# Patient Record
Sex: Female | Born: 1935 | Race: White | Hispanic: No | Smoking: Former smoker
Health system: Southern US, Community
[De-identification: ages and names within clinical notes are randomized; demographics above are authoritative.]

## PROBLEM LIST (undated history)

## (undated) DIAGNOSIS — I739 Peripheral vascular disease, unspecified: Secondary | ICD-10-CM

## (undated) DIAGNOSIS — M069 Rheumatoid arthritis, unspecified: Secondary | ICD-10-CM

## (undated) DIAGNOSIS — T7840XA Allergy, unspecified, initial encounter: Secondary | ICD-10-CM

## (undated) DIAGNOSIS — M5430 Sciatica, unspecified side: Secondary | ICD-10-CM

## (undated) DIAGNOSIS — J189 Pneumonia, unspecified organism: Secondary | ICD-10-CM

## (undated) DIAGNOSIS — K589 Irritable bowel syndrome without diarrhea: Secondary | ICD-10-CM

## (undated) HISTORY — PX: CATARACT EXTRACTION W/ INTRAOCULAR LENS  IMPLANT, BILATERAL: SHX1307

## (undated) HISTORY — DX: Rheumatoid arthritis, unspecified: M06.9

## (undated) HISTORY — PX: ROTATOR CUFF REPAIR: SHX139

## (undated) HISTORY — PX: REPLACEMENT TOTAL KNEE: SUR1224

## (undated) HISTORY — DX: Allergy, unspecified, initial encounter: T78.40XA

## (undated) HISTORY — PX: FACIAL COSMETIC SURGERY: SHX629

## (undated) HISTORY — PX: EYE SURGERY: SHX253

## (undated) HISTORY — PX: JOINT REPLACEMENT: SHX530

## (undated) HISTORY — DX: Irritable bowel syndrome, unspecified: K58.9

---

## 1989-07-26 HISTORY — PX: BUNIONECTOMY: SHX129

## 1998-02-23 ENCOUNTER — Encounter: Admission: RE | Admit: 1998-02-23 | Discharge: 1998-05-24 | Payer: Self-pay | Admitting: Orthopedic Surgery

## 2001-11-06 ENCOUNTER — Encounter: Payer: Self-pay | Admitting: Family Medicine

## 2001-11-06 ENCOUNTER — Encounter: Admission: RE | Admit: 2001-11-06 | Discharge: 2001-11-06 | Payer: Self-pay | Admitting: Family Medicine

## 2002-12-10 ENCOUNTER — Encounter: Admission: RE | Admit: 2002-12-10 | Discharge: 2002-12-10 | Payer: Self-pay | Admitting: Gastroenterology

## 2002-12-10 ENCOUNTER — Encounter: Payer: Self-pay | Admitting: Gastroenterology

## 2005-09-30 ENCOUNTER — Encounter: Admission: RE | Admit: 2005-09-30 | Discharge: 2005-09-30 | Payer: Self-pay | Admitting: Orthopedic Surgery

## 2005-10-03 ENCOUNTER — Ambulatory Visit (HOSPITAL_COMMUNITY): Admission: RE | Admit: 2005-10-03 | Discharge: 2005-10-03 | Payer: Self-pay | Admitting: Orthopedic Surgery

## 2005-10-03 ENCOUNTER — Ambulatory Visit (HOSPITAL_BASED_OUTPATIENT_CLINIC_OR_DEPARTMENT_OTHER): Admission: RE | Admit: 2005-10-03 | Discharge: 2005-10-03 | Payer: Self-pay | Admitting: Orthopedic Surgery

## 2008-04-26 ENCOUNTER — Encounter: Admission: RE | Admit: 2008-04-26 | Discharge: 2008-04-26 | Payer: Self-pay | Admitting: Internal Medicine

## 2008-05-17 ENCOUNTER — Encounter: Admission: RE | Admit: 2008-05-17 | Discharge: 2008-05-17 | Payer: Self-pay | Admitting: Internal Medicine

## 2008-05-24 ENCOUNTER — Encounter: Admission: RE | Admit: 2008-05-24 | Discharge: 2008-05-24 | Payer: Self-pay | Admitting: Interventional Radiology

## 2008-06-21 ENCOUNTER — Encounter: Admission: RE | Admit: 2008-06-21 | Discharge: 2008-06-21 | Payer: Self-pay | Admitting: Interventional Radiology

## 2008-11-29 ENCOUNTER — Encounter: Admission: RE | Admit: 2008-11-29 | Discharge: 2008-11-29 | Payer: Self-pay | Admitting: Interventional Radiology

## 2009-05-04 ENCOUNTER — Encounter: Admission: RE | Admit: 2009-05-04 | Discharge: 2009-05-04 | Payer: Self-pay | Admitting: Internal Medicine

## 2009-06-27 ENCOUNTER — Encounter: Admission: RE | Admit: 2009-06-27 | Discharge: 2009-06-27 | Payer: Self-pay | Admitting: Interventional Radiology

## 2009-09-08 ENCOUNTER — Encounter: Admission: RE | Admit: 2009-09-08 | Discharge: 2009-09-08 | Payer: Self-pay | Admitting: Orthopedic Surgery

## 2009-09-22 ENCOUNTER — Inpatient Hospital Stay (HOSPITAL_COMMUNITY): Admission: RE | Admit: 2009-09-22 | Discharge: 2009-09-26 | Payer: Self-pay | Admitting: Orthopedic Surgery

## 2009-12-06 ENCOUNTER — Inpatient Hospital Stay (HOSPITAL_COMMUNITY): Admission: EM | Admit: 2009-12-06 | Discharge: 2009-12-09 | Payer: Self-pay | Admitting: Emergency Medicine

## 2010-02-28 ENCOUNTER — Ambulatory Visit (HOSPITAL_COMMUNITY): Admission: RE | Admit: 2010-02-28 | Discharge: 2010-02-28 | Payer: Self-pay | Admitting: Internal Medicine

## 2011-02-10 LAB — CULTURE, BLOOD (SINGLE): Culture: NO GROWTH

## 2011-02-10 LAB — CBC
HCT: 35.8 % — ABNORMAL LOW (ref 36.0–46.0)
Hemoglobin: 11.9 g/dL — ABNORMAL LOW (ref 12.0–15.0)
MCV: 97.4 fL (ref 78.0–100.0)
RBC: 3.67 MIL/uL — ABNORMAL LOW (ref 3.87–5.11)
RDW: 15.8 % — ABNORMAL HIGH (ref 11.5–15.5)

## 2011-02-10 LAB — COMPREHENSIVE METABOLIC PANEL
ALT: 12 U/L (ref 0–35)
Albumin: 2.7 g/dL — ABNORMAL LOW (ref 3.5–5.2)
Alkaline Phosphatase: 51 U/L (ref 39–117)
Chloride: 108 mEq/L (ref 96–112)
Potassium: 3.7 mEq/L (ref 3.5–5.1)
Sodium: 138 mEq/L (ref 135–145)
Total Bilirubin: 0.4 mg/dL (ref 0.3–1.2)
Total Protein: 5.4 g/dL — ABNORMAL LOW (ref 6.0–8.3)

## 2011-02-10 LAB — IRON AND TIBC

## 2011-02-10 LAB — DIFFERENTIAL
Eosinophils Absolute: 0 10*3/uL (ref 0.0–0.7)
Eosinophils Relative: 0 % (ref 0–5)
Monocytes Absolute: 0.4 10*3/uL (ref 0.1–1.0)
Neutrophils Relative %: 78 % — ABNORMAL HIGH (ref 43–77)

## 2011-02-10 LAB — CULTURE, BLOOD (ROUTINE X 2)

## 2011-02-10 LAB — RETICULOCYTES
RBC.: 3.59 MIL/uL — ABNORMAL LOW (ref 3.87–5.11)
Retic Count, Absolute: 28.7 10*3/uL (ref 19.0–186.0)

## 2011-02-10 LAB — LEGIONELLA ANTIGEN, URINE: Legionella Antigen, Urine: NEGATIVE

## 2011-02-10 LAB — POCT I-STAT, CHEM 8
HCT: 37 % (ref 36.0–46.0)
Hemoglobin: 12.6 g/dL (ref 12.0–15.0)
TCO2: 24 mmol/L (ref 0–100)

## 2011-02-10 LAB — FOLATE: Folate: 11.4 ng/mL

## 2011-02-10 LAB — FERRITIN: Ferritin: 109 ng/mL (ref 10–291)

## 2011-02-10 LAB — STREP PNEUMONIAE URINARY ANTIGEN: Strep Pneumo Urinary Antigen: NEGATIVE

## 2011-02-10 LAB — VITAMIN B12: Vitamin B-12: 537 pg/mL (ref 211–911)

## 2011-02-21 ENCOUNTER — Encounter: Payer: Self-pay | Admitting: Gastroenterology

## 2011-02-26 NOTE — Letter (Signed)
Summary: New Patient letter  [facility]  [address] [place], [place] [address]   Phone: [phone]  Fax: [phone]       02/21/2011 MRN: [id number]   Caroline Lewis [address] [place], [place]  [address]  Dear Ms. Hegeman,  Welcome to the Gastroenterology Division at [facility].    You are scheduled to see Dr.  Arlyce Dice  on 04-08-11 at 1:30pm on the 3rd floor at [facility], [address].  We ask that you try to arrive at our office 15 minutes prior to your appointment time to allow for check-in.  We would like you to complete the enclosed self-administered evaluation form prior to your visit and bring it with you on the day of your appointment.  We will review it with you.  Also, please bring a complete list of all your medications or, if you prefer, bring the medication bottles and we will list them.  Please bring your insurance card so that we may make a copy of it.  If your insurance requires a referral to see a specialist, please bring your referral form from your primary care physician.  Co-payments are due at the time of your visit and may be paid by cash, check or credit card.     Your office visit will consist of a consult with your physician (includes a physical exam), any laboratory testing he/she may order, scheduling of any necessary diagnostic testing (e.g. x-ray, ultrasound, CT-scan), and scheduling of a procedure (e.g. Endoscopy, Colonoscopy) if required.  Please allow enough time on your schedule to allow for any/all of these possibilities.    If you cannot keep your appointment, please call [phone] to cancel or reschedule prior to your appointment date.  This allows [place] the opportunity to schedule an appointment for another patient in need of care.  If you do not cancel or reschedule by 5 p.m. the business day prior to your appointment date, you will be charged a $50.00 late cancellation/no-show fee.    Thank you  for choosing [facility] for your medical needs.  We appreciate the opportunity to care for you.  Please visit [place] at our website  to learn more about our practice.                     Sincerely,                                                             The Gastroenterology Division

## 2011-02-27 LAB — CBC
MCHC: 33.4 g/dL (ref 30.0–36.0)
MCV: 104.9 fL — ABNORMAL HIGH (ref 78.0–100.0)
Platelets: 158 10*3/uL (ref 150–400)
RDW: 16.6 % — ABNORMAL HIGH (ref 11.5–15.5)

## 2011-02-27 LAB — PROTIME-INR: Prothrombin Time: 22.7 seconds — ABNORMAL HIGH (ref 11.6–15.2)

## 2011-02-28 LAB — CBC
HCT: 28.3 % — ABNORMAL LOW (ref 36.0–46.0)
HCT: 36.8 % (ref 36.0–46.0)
Hemoglobin: 9.7 g/dL — ABNORMAL LOW (ref 12.0–15.0)
MCHC: 33.5 g/dL (ref 30.0–36.0)
MCHC: 33.8 g/dL (ref 30.0–36.0)
MCV: 103.7 fL — ABNORMAL HIGH (ref 78.0–100.0)
MCV: 105.4 fL — ABNORMAL HIGH (ref 78.0–100.0)
MCV: 105.6 fL — ABNORMAL HIGH (ref 78.0–100.0)
Platelets: 152 10*3/uL (ref 150–400)
Platelets: 228 10*3/uL (ref 150–400)
RDW: 16.9 % — ABNORMAL HIGH (ref 11.5–15.5)
RDW: 17.3 % — ABNORMAL HIGH (ref 11.5–15.5)
RDW: 17.9 % — ABNORMAL HIGH (ref 11.5–15.5)

## 2011-02-28 LAB — BASIC METABOLIC PANEL
BUN: 7 mg/dL (ref 6–23)
CO2: 28 mEq/L (ref 19–32)
CO2: 32 mEq/L (ref 19–32)
Calcium: 8.7 mg/dL (ref 8.4–10.5)
Chloride: 99 mEq/L (ref 96–112)
Creatinine, Ser: 0.65 mg/dL (ref 0.4–1.2)
Glucose, Bld: 111 mg/dL — ABNORMAL HIGH (ref 70–99)
Glucose, Bld: 118 mg/dL — ABNORMAL HIGH (ref 70–99)
Potassium: 3.7 mEq/L (ref 3.5–5.1)

## 2011-02-28 LAB — PROTIME-INR
INR: 1.57 — ABNORMAL HIGH (ref 0.00–1.49)
Prothrombin Time: 12.5 seconds (ref 11.6–15.2)
Prothrombin Time: 18.6 seconds — ABNORMAL HIGH (ref 11.6–15.2)

## 2011-02-28 LAB — DIFFERENTIAL
Basophils Absolute: 0 10*3/uL (ref 0.0–0.1)
Lymphocytes Relative: 21 % (ref 12–46)
Monocytes Absolute: 0.2 10*3/uL (ref 0.1–1.0)
Neutro Abs: 4.2 10*3/uL (ref 1.7–7.7)

## 2011-02-28 LAB — URINALYSIS, ROUTINE W REFLEX MICROSCOPIC
Ketones, ur: NEGATIVE mg/dL
Nitrite: NEGATIVE
Protein, ur: NEGATIVE mg/dL
pH: 7 (ref 5.0–8.0)

## 2011-02-28 LAB — COMPREHENSIVE METABOLIC PANEL
Albumin: 3.6 g/dL (ref 3.5–5.2)
BUN: 9 mg/dL (ref 6–23)
Calcium: 9.4 mg/dL (ref 8.4–10.5)
Chloride: 107 mEq/L (ref 96–112)
Creatinine, Ser: 0.85 mg/dL (ref 0.4–1.2)
Total Bilirubin: 0.7 mg/dL (ref 0.3–1.2)

## 2011-02-28 LAB — APTT: aPTT: 30 seconds (ref 24–37)

## 2011-03-05 ENCOUNTER — Telehealth: Payer: Self-pay | Admitting: Gastroenterology

## 2011-03-05 NOTE — Telephone Encounter (Signed)
Scheduled pt to see Dr. Arlyce Dice 03/06/11 @3 :45pm. Pat to notify pt of appt date and time. Pat to send records.

## 2011-03-06 ENCOUNTER — Ambulatory Visit (INDEPENDENT_AMBULATORY_CARE_PROVIDER_SITE_OTHER): Payer: Medicare HMO | Admitting: Gastroenterology

## 2011-03-06 ENCOUNTER — Encounter: Payer: Self-pay | Admitting: Gastroenterology

## 2011-03-06 DIAGNOSIS — R1032 Left lower quadrant pain: Secondary | ICD-10-CM | POA: Insufficient documentation

## 2011-03-06 DIAGNOSIS — K5900 Constipation, unspecified: Secondary | ICD-10-CM | POA: Insufficient documentation

## 2011-03-06 MED ORDER — HYOSCYAMINE SULFATE ER 0.375 MG PO TBCR: EXTENDED_RELEASE_TABLET | ORAL | Status: DC

## 2011-03-06 NOTE — Progress Notes (Signed)
History of Present Illness: Caroline Lewis is a pleasant, 77 year old white female referred at the request of Dr.Pang for evaluation of abdominal pain and constipation. Constipation has been a fairly chronic problem. She has  had worsening lower pain for several months. It is interfering with her activities of daily living. It is partially relieved by moving her bowels. Concurrent with her lower pain is  low back pain. She requires bowel stimulants to move her bowels. She denies melena or hematochezia. CT scan on March 05, 2011 demonstrated circumferential rectal wall thickening and severe constipation. Severe thoracolumbar scoliosis with degenerative changes were seen.    Review of Systems: Pertinent positive and negative review of systems were noted in the above HPI section. All other review of systems were otherwise negative.    Current Medications, Allergies, Past Medical History, Past Surgical History, Family History and Social History were reviewed in [employer] record . Vital signs were reviewed in today's medical record Physical Exam: General: Well developed , well nourished, no acute distress Head: Normocephalic and atraumatic Eyes:  sclerae anicteric, EOMI Ears: Normal auditory acuity Mouth: No deformity or lesions Neck: Supple, no masses or thyromegaly Lungs: Clear throughout to auscultation Heart: Regular rate and rhythm; no murmurs, rubs or bruits Abdomen: Soft, non tender and non distended. No masses, hepatosplenomegaly or hernias noted. Normal Bowel sounds Rectal:no rectal masses; stool heme negative Musculoskeletal: Symmetrical with no gross deformities  Skin: No lesions on visible extremities Pulses:  Normal pulses noted Extremities: No clubbing, cyanosis, edema or deformities noted Neurological: Alert oriented x 4, grossly nonfocal Cervical Nodes:  No significant cervical adenopathy Inguinal Nodes: No significant inguinal adenopathy Psychological:   Alert and cooperative. Normal mood and affect

## 2011-03-06 NOTE — Patient Instructions (Signed)
Colonoscopy A colonoscopy is an exam to evaluate your entire colon. In this exam, your colon is cleansed. A long fiberoptic tube is inserted through your rectum and into your colon. The fiberoptic scope (endoscope) is a long bundle of enclosed and very flexible fibers. These fibers transmit light to the area examined and send images from that area to your caregiver. Discomfort is usually minimal. You may be given a drug to help you sleep (sedative) during or prior to the procedure. This exam helps to detect lumps (tumors), polyps, inflammation, and areas of bleeding. Your caregiver may also take a small piece of tissue (biopsy) that will be examined under a microscope. BEFORE THE PROCEDURE  A clear liquid diet may be required for 2 days before the exam.   Liquid injections (enemas) or laxatives may be required.   A large amount of electrolyte solution may be given to you to drink over a short period of time. This solution is used to clean out your colon.   You should be present1  prior to your procedure or as directed by your caregiver.   Check in at the admissions desk to fill out necessary forms if not preregistered. There will be consent forms to sign prior to the procedure. If accompanied by friends or family, there is a waiting area for them while you are having your procedure.  LET YOUR CAREGIVER KNOW ABOUT:  Allergies to food or medicine.  Medicines taken, including vitamins, herbs, eyedrops, over-the-counter medicines, and creams.   Use of steroids (by mouth or creams).   Previous problems with anesthetics or numbing medicines.   History of bleeding problems or blood clots.  Previous surgery.   Other health problems, including diabetes and kidney problems.   Possibility of pregnancy, if this applies.   AFTER THE PROCEDURE  If you received a sedative and/or pain medicine, you will need to arrange for someone to drive you home.   Occasionally, there is a little blood passed  with the first bowel movement. DO NOT be concerned.  HOME CARE INSTRUCTIONS  It is not unusual to pass moderate amounts of gas and experience mild abdominal cramping following the procedure. This is due to air being used to inflate your colon during the exam. Walking or a warm pack on your belly (abdomen) may help.   You may resume all normal meals and activities after sedatives and medicines have worn off.   Only take over-the-counter or prescription medicines for pain, discomfort, or fever as directed by your caregiver. DO NOT use aspirin or blood thinners if a biopsy was taken. Consult your caregiver for medicine usage if biopsies were taken.  FINDING OUT THE RESULTS OF YOUR TEST Not all test results are available during your visit. If your test results are not back during the visit, make an appointment with your caregiver to find out the results. Do not assume everything is normal if you have not heard from your caregiver or the medical facility. It is important for you to follow up on all of your test results. SEEK IMMEDIATE MEDICAL CARE IF:  You pass large blood clots or fill a toilet with blood following the procedure. This may also occur 10 to 14 days following the procedure. This is more likely if a biopsy was taken.   You develop abdominal pain that keeps getting worse and cannot be relieved with medicine.  Document Released: 11/08/2000 Document Re-Released: 02/05/2010 [facility] Patient Information 2011 [place], [place]. Your Colonoscopy is scheduled on 03/07/2011 at  3:30pm

## 2011-03-06 NOTE — Assessment & Plan Note (Signed)
Plan colonoscopy to rule out a colonic lesion. If negative, I will place her on fiber and MiraLax.

## 2011-03-06 NOTE — Assessment & Plan Note (Addendum)
Lower abdominal pain, may be related to constipation. A structural abnormality of the colon should be ruled out, especially in view of the CT findings of a thickened rectal wall. At the same time the pain could be a manifestation of nerve root pain from her lumbar sacral spine.  Recommendations #1 colonoscopy; if no findings are seen I will work on a bowel regimen with her to see if that relieves her lower abdominal and back pain. #2 trial of hyomax 0.375 mg twice a day

## 2011-03-07 ENCOUNTER — Ambulatory Visit (AMBULATORY_SURGERY_CENTER): Payer: Medicare HMO | Admitting: Gastroenterology

## 2011-03-07 ENCOUNTER — Encounter: Payer: Self-pay | Admitting: Gastroenterology

## 2011-03-07 DIAGNOSIS — K5730 Diverticulosis of large intestine without perforation or abscess without bleeding: Secondary | ICD-10-CM

## 2011-03-07 DIAGNOSIS — R1032 Left lower quadrant pain: Secondary | ICD-10-CM

## 2011-03-07 DIAGNOSIS — R109 Unspecified abdominal pain: Secondary | ICD-10-CM

## 2011-03-07 DIAGNOSIS — R933 Abnormal findings on diagnostic imaging of other parts of digestive tract: Secondary | ICD-10-CM

## 2011-03-07 DIAGNOSIS — K5900 Constipation, unspecified: Secondary | ICD-10-CM

## 2011-03-07 MED ORDER — SODIUM CHLORIDE 0.9 % IV SOLN: 500.0000 mL | INTRAVENOUS | Status: DC

## 2011-03-07 NOTE — Patient Instructions (Addendum)
Take fiber supplements daily (metamucil) If no bowel movement in 2-3 days, begin miralax 1-2x/day Continue hyomax Call back in 5 days to report progress DISCHARGED INSTRUCTIONS GIVEN WITH VERBAL UNDERSTANDING. Handout on Diverticulosis given.

## 2011-03-08 ENCOUNTER — Encounter: Payer: Self-pay | Admitting: Gastroenterology

## 2011-03-08 ENCOUNTER — Telehealth: Payer: Self-pay | Admitting: *Deleted

## 2011-03-08 NOTE — Telephone Encounter (Signed)
No ability to leave message, no identifier.

## 2011-03-11 ENCOUNTER — Telehealth: Payer: Self-pay | Admitting: Gastroenterology

## 2011-03-12 ENCOUNTER — Telehealth: Payer: Self-pay | Admitting: Gastroenterology

## 2011-03-12 NOTE — Telephone Encounter (Signed)
See my note from earlier today.  I sent a letter to Dr. Ricki Miller

## 2011-03-12 NOTE — Telephone Encounter (Signed)
Hyomax will help with abdominal cramping. If her general abd pain is not improved with anticholinergics she should see Dr. Ricki Miller again for evaluation of low back pain with radiation.  I will send a letter to Dr. Ricki Miller.

## 2011-03-12 NOTE — Telephone Encounter (Signed)
Pts son called and states that his mother's pain is not any better. He states the Hyomax has not helped her at all. Son wants to know if this pain could possibly be caused by her gallbladder. He states that she is in constant pain everyday. He is very concerned because the pain is really affecting her and her quality of life. Dr. Arlyce Dice please advise.

## 2011-03-12 NOTE — Telephone Encounter (Signed)
Pt with COLON on 03/07/11 showing diverticulosis in Ascending Colon, otherwise normal. Pt had the appt moved up d/t increased abdominal pain . Pt reports she is hurting just as bad as before. She states the pain is on her left side even with the belly button and the Hyoscyamine isn't helping the pain. She is using a stool softener. Pt would like to know:  1) Should she ask her Rheumatologist to increase or change her Methotrexate?   She stated Dr Arlyce Dice stated he thinks the pain is her back pain radiating to her stomach. 2) Can she have the cramping pain in her abdomen and not have the back pain? 3) Does she f/u with her PCP for her abdominal problems or call us? 4) Does she keep her original 04/08/11 appt?

## 2011-03-13 NOTE — Telephone Encounter (Signed)
Left message for son that Dr. Arlyce Dice feels pain may be related to nerve root pain from the lumbarsacral spine and that she should follow-up with Dr. Ricki Miller. Also let son know that a letter has been sent to Dr. Ricki Miller stating the above.

## 2011-03-15 NOTE — Telephone Encounter (Signed)
No evidence for GB disease by recent CT. Librax not as good as hyomax. Is she moving her bowels?  If not she needs to take miralax.  When she has a bm does it relieve her pain?

## 2011-03-15 NOTE — Telephone Encounter (Signed)
1) Informed pt that that Dr Arlyce Dice does not think she has gall bladder disease as evidenced by her latest CT scan. 2) Pt reports she is having stools with Metamucil and high fiber- grains , in her diet. 3) Pt states the pain lessens after a BM.  Informed her I will call back when I have a reply, but it may be next week- stated understanding.

## 2011-03-15 NOTE — Telephone Encounter (Signed)
Finally reached pt this am- apparently son called back instead of the pt and spoke with Selinda Michaels, RN. He was questioning whether pt has gall bladder disease. Pt reports her pain is still as bad as it was prior to COLON. The Hycomax isn't working , could she possibly try Librax? Thanks.

## 2011-03-21 NOTE — Telephone Encounter (Signed)
Needs to f/u with PCP re: nerve root pain

## 2011-03-21 NOTE — Telephone Encounter (Signed)
Spoke with pt to ask if she f/u with Dr Ricki Miller as Dr Arlyce Dice suggested. Pt reports Dr Ricki Miller is waiting on Dr Marzetta Board letter. Faxed letter written on 03/12/11- informed pr.

## 2011-04-08 ENCOUNTER — Ambulatory Visit: Payer: Self-pay | Admitting: Gastroenterology

## 2011-04-12 NOTE — Op Note (Signed)
Caroline Lewis, Caroline Lewis             ACCOUNT NO.:  [id number]   MEDICAL RECORD NO.:  [id number]          PATIENT TYPE:  AMB   LOCATION:  DSC                          FACILITY:  [facility]   PHYSICIAN:  Nadara Mustard, MD     DATE OF BIRTH:  [birth year]   DATE OF PROCEDURE:  10/03/2005  DATE OF DISCHARGE:                                 OPERATIVE REPORT   PREOPERATIVE DIAGNOSIS:  Rheumatoid arthritis with hallux valgus deformity  of the left great toe and clawing of the lesser toes.   POSTOPERATIVE DIAGNOSIS:  Rheumatoid arthritis with hallux valgus deformity  of the left great toe and clawing of the lesser toes.   PROCEDURES:  1.  Left first metatarsal Ludloff osteotomy.  2.  Left first proximal phalanx Akin osteotomy.  3.  Extensor tendon releases for toes one through four, left foot.   SURGEON:  Nadara Mustard, MD   ANESTHESIA:  Popliteal block.   ESTIMATED BLOOD LOSS:  Minimal.   ANTIBIOTICS:  1 g Kefzol.   TOURNIQUET TIME:  Esmarch at the ankle for approximately 30 minutes.   INDICATION FOR PROCEDURE:  The patient is a 77 year old woman with  rheumatoid arthritis with a painful hallux valgus deformity.  The patient  does not have pain on the plantar aspect of the lesser metatarsals but does  have pain primarily over the great toe with overlapping of the great and  second toe.  The patient has severe hallux valgus deformity and presents at  this time for the above-mentioned procedure.  The risks and benefits were  discussed including infection, neurovascular injury, nonhealing of wound,  loss of reduction, and need for additional surgery.  The patient states she  understands and wishes to proceed at this time.   DESCRIPTION OF PROCEDURE:  The patient underwent a popliteal block in the  holding area and she was then brought to OR room 2.  The patient underwent  local MAC anesthetic.  After adequate level of anesthesia obtained, the  patient's left lower extremity was prepped  using DuraPrep and draped into a  sterile field.  A longitudinal medial incision was made over the first  metatarsal and first proximal phalanx.  An ellipse was taken out of the  retinaculum.  A second incision was made in the first web space and the  abductor tendons were released and the capsule was released.  A Ludloff  osteotomy was performed.  The hallux valgus deformity was corrected, and  this was stabilized using lag screw technique with two 3.5 cortical screws  18 mm in length.  An ostectomy was performed off the distal metatarsal head.  An Akin osteotomy was then performed on the proximal phalanx.  This was then  corrected with the deformity and a 1.6 mm K-wire was inserted to stabilize  the Akin osteotomy.  The wounds were irrigated with normal saline and the  retinaculum was closed using 2-0 Vicryl and the skin was closed using 2-0  nylon.  The patient did have a flexion contracture of the EHL, and a Z  lengthening procedure was performed for the EHL  and a tenotomy was performed  to the extensor tendons for toes two through four.  This improved the  alignment of her toes but did not completely correct the clawing of the  toes.  There was no pressure on the great toe and second toe.  The Esmarch  was released after approximately 30 minutes.  The wounds were covered  Adaptic orthopedic sponges, sterile Webril and a Coban dressing.  The  patient was then taken to the PACU in stable condition, planned for  discharge to home.  Follow-up in the office in two weeks.      Nadara Mustard, MD  Electronically Signed     MVD/MEDQ  D:  10/03/2005  T:  10/04/2005  Job:  [id number]

## 2011-04-24 ENCOUNTER — Other Ambulatory Visit: Payer: Self-pay | Admitting: Internal Medicine

## 2011-04-24 DIAGNOSIS — M25561 Pain in right knee: Secondary | ICD-10-CM

## 2011-04-29 ENCOUNTER — Ambulatory Visit
Admission: RE | Admit: 2011-04-29 | Discharge: 2011-04-29 | Disposition: A | Discharge status: Home / Self Care | Payer: Medicare HMO | Source: Ambulatory Visit | Attending: Internal Medicine | Admitting: Internal Medicine

## 2011-04-29 DIAGNOSIS — M25561 Pain in right knee: Secondary | ICD-10-CM

## 2011-05-31 ENCOUNTER — Encounter: Payer: Self-pay | Admitting: Family Medicine

## 2011-05-31 ENCOUNTER — Inpatient Hospital Stay (INDEPENDENT_AMBULATORY_CARE_PROVIDER_SITE_OTHER)
Admission: RE | Admit: 2011-05-31 | Discharge: 2011-05-31 | Disposition: A | Discharge status: Home / Self Care | Payer: Medicare HMO | Source: Ambulatory Visit | Attending: Family Medicine | Admitting: Family Medicine

## 2011-05-31 DIAGNOSIS — M069 Rheumatoid arthritis, unspecified: Secondary | ICD-10-CM | POA: Insufficient documentation

## 2011-05-31 DIAGNOSIS — W010XXA Fall on same level from slipping, tripping and stumbling without subsequent striking against object, initial encounter: Secondary | ICD-10-CM

## 2011-05-31 DIAGNOSIS — S51809A Unspecified open wound of unspecified forearm, initial encounter: Secondary | ICD-10-CM

## 2011-06-03 ENCOUNTER — Encounter: Payer: Self-pay | Admitting: Family Medicine

## 2011-06-03 ENCOUNTER — Inpatient Hospital Stay (INDEPENDENT_AMBULATORY_CARE_PROVIDER_SITE_OTHER)
Admission: RE | Admit: 2011-06-03 | Discharge: 2011-06-03 | Disposition: A | Discharge status: Home / Self Care | Payer: Medicare HMO | Source: Ambulatory Visit | Attending: Family Medicine | Admitting: Family Medicine

## 2011-06-03 DIAGNOSIS — B002 Herpesviral gingivostomatitis and pharyngotonsillitis: Secondary | ICD-10-CM | POA: Insufficient documentation

## 2011-06-03 DIAGNOSIS — Z4800 Encounter for change or removal of nonsurgical wound dressing: Secondary | ICD-10-CM

## 2011-06-03 DIAGNOSIS — L299 Pruritus, unspecified: Secondary | ICD-10-CM

## 2011-06-03 DIAGNOSIS — IMO0002 Reserved for concepts with insufficient information to code with codable children: Secondary | ICD-10-CM

## 2011-06-07 ENCOUNTER — Encounter: Payer: Self-pay | Admitting: Family Medicine

## 2011-06-07 ENCOUNTER — Inpatient Hospital Stay (INDEPENDENT_AMBULATORY_CARE_PROVIDER_SITE_OTHER)
Admission: RE | Admit: 2011-06-07 | Discharge: 2011-06-07 | Disposition: A | Discharge status: Home / Self Care | Payer: Medicare HMO | Source: Ambulatory Visit | Attending: Family Medicine | Admitting: Family Medicine

## 2011-06-07 DIAGNOSIS — Z4800 Encounter for change or removal of nonsurgical wound dressing: Secondary | ICD-10-CM

## 2011-08-21 ENCOUNTER — Other Ambulatory Visit (HOSPITAL_COMMUNITY): Payer: Medicare HMO

## 2011-08-22 ENCOUNTER — Other Ambulatory Visit (HOSPITAL_COMMUNITY): Payer: Medicare HMO

## 2011-08-23 ENCOUNTER — Ambulatory Visit (HOSPITAL_COMMUNITY): Admission: RE | Admit: 2011-08-23 | Payer: Medicare HMO | Source: Ambulatory Visit | Admitting: Orthopedic Surgery

## 2011-09-05 ENCOUNTER — Encounter (HOSPITAL_BASED_OUTPATIENT_CLINIC_OR_DEPARTMENT_OTHER)
Admission: RE | Admit: 2011-09-05 | Discharge: 2011-09-05 | Disposition: A | Discharge status: Home / Self Care | Payer: Medicare HMO | Source: Ambulatory Visit | Attending: Orthopedic Surgery | Admitting: Orthopedic Surgery

## 2011-09-05 ENCOUNTER — Other Ambulatory Visit: Payer: Self-pay | Admitting: Orthopedic Surgery

## 2011-09-05 ENCOUNTER — Ambulatory Visit
Admission: RE | Admit: 2011-09-05 | Discharge: 2011-09-05 | Disposition: A | Discharge status: Home / Self Care | Payer: Medicare HMO | Source: Ambulatory Visit | Attending: Orthopedic Surgery | Admitting: Orthopedic Surgery

## 2011-09-05 DIAGNOSIS — Z01811 Encounter for preprocedural respiratory examination: Secondary | ICD-10-CM

## 2011-09-06 ENCOUNTER — Ambulatory Visit (HOSPITAL_BASED_OUTPATIENT_CLINIC_OR_DEPARTMENT_OTHER)
Admission: RE | Admit: 2011-09-06 | Discharge: 2011-09-06 | Disposition: A | Discharge status: Home / Self Care | Payer: Medicare HMO | Source: Ambulatory Visit | Attending: Orthopedic Surgery | Admitting: Orthopedic Surgery

## 2011-09-06 DIAGNOSIS — M23302 Other meniscus derangements, unspecified lateral meniscus, unspecified knee: Secondary | ICD-10-CM | POA: Insufficient documentation

## 2011-09-06 DIAGNOSIS — M6750 Plica syndrome, unspecified knee: Secondary | ICD-10-CM | POA: Insufficient documentation

## 2011-09-06 DIAGNOSIS — M23305 Other meniscus derangements, unspecified medial meniscus, unspecified knee: Secondary | ICD-10-CM | POA: Insufficient documentation

## 2011-09-06 DIAGNOSIS — Z01818 Encounter for other preprocedural examination: Secondary | ICD-10-CM | POA: Insufficient documentation

## 2011-09-06 DIAGNOSIS — M2340 Loose body in knee, unspecified knee: Secondary | ICD-10-CM | POA: Insufficient documentation

## 2011-09-06 DIAGNOSIS — Z01810 Encounter for preprocedural cardiovascular examination: Secondary | ICD-10-CM | POA: Insufficient documentation

## 2011-09-19 NOTE — Op Note (Signed)
Caroline Lewis, Caroline Lewis             ACCOUNT NO.:  [id number]  MEDICAL RECORD NO.:  [id number]  LOCATION:  SDS                          FACILITY:  [facility]  PHYSICIAN:  Harvie Junior, M.D.   DATE OF BIRTH:  [birth year]  DATE OF PROCEDURE:  09/06/2011 DATE OF DISCHARGE:                              OPERATIVE REPORT   PREOPERATIVE DIAGNOSES:  Medial and lateral meniscal tear.  POSTOPERATIVE DIAGNOSES: 1. Medial meniscal tear. 2. Lateral meniscal tear. 3. Medial shelf plica. 4. Osteochondral loose body.  PRINCIPAL PROCEDURE: 1. Partial medial and partial lateral meniscectomies with     corresponding debridement of medial and lateral compartments. 2. Medial shelf plica excision. 3. Excision of loose body.  SURGEON:  Harvie Junior, MD  ASSISTANT:  Marshia Ly, PA  ANESTHESIA:  General.  BRIEF HISTORY:  Caroline Lewis is an 77 year old female with a long history of significant right knee pain who had been treated conservatively for the long period of time.  MRI initially showed that she had a stress fracture of the medial tibial plateau, and we treated it conservatively for a long period time even though she had medial and lateral meniscal tearing.  After a period of time, we ultimately talked about doing a subchondroplasty, this was denied by [facility] for [place] being too expensive of procedure to be performed in their hospital, and so we ultimately held on that and followed her as it continued to follow as an outpatient.  Ultimately, her pain what appeared to be from the stress fracture resolved, she continued to have posterior knee pain, intermittent locking and catching in the knee, and because of that, we felt that it was appropriate just to proceed with operative knee arthroscopy.  We had a prolonged discussion with she and her daughters about the feasibility and viability of this procedure as a stand-alone procedure, and she felt that this was the appropriate thing to do.   At this point, she was brought to the operating room for this procedure.  PROCEDURE:  The patient was brought to the operating room.  After adequate level of anesthesia was obtained with general anesthetic, the patient was placed supine on the operating table.  The right leg was prepped and draped in usual sterile fashion.  Following this, routine arthroscopic examination of the knee revealed there was an obvious medial meniscal tear, this was debrided back to a smooth and stable rim. Attention was turned to the lateral compartment where the lateral meniscus was debrided back to a smooth and stable rim.  Medial and lateral compartments were identified with really only minimal chondromalacia in those 2  areas.  Attention was turned up to the patellofemoral joint where there was a large medial shelf plica which was debrided, and while addressing the lateral compartment with the lateral meniscectomy, there was a large chondral loose body which came into the knee joint, this was debrided out with a suction shaver.  At this point, the knee was copiously and thoroughly lavaged and suction dried.  The arthroscope portals were closed with bandage.  Sterile compressive dressing was applied.  The patient was taken to the recovery room, was noted to be in satisfactory condition.  Estimated blood loss for this procedure was none.     Harvie Junior, M.D.     Ranae Plumber  D:  09/06/2011  T:  09/06/2011  Job:  [id number]  Electronically Signed by Jodi Geralds M.D. on 09/19/2011 02:10:42 PM

## 2011-10-11 ENCOUNTER — Emergency Department: Admit: 2011-10-11 | Discharge: 2011-10-11 | Disposition: A | Discharge status: Home / Self Care | Payer: Medicare HMO

## 2011-10-11 ENCOUNTER — Emergency Department (INDEPENDENT_AMBULATORY_CARE_PROVIDER_SITE_OTHER)
Admission: EM | Admit: 2011-10-11 | Discharge: 2011-10-11 | Disposition: A | Discharge status: Home / Self Care | Payer: Medicare HMO | Source: Home / Self Care | Attending: Family Medicine | Admitting: Family Medicine

## 2011-10-11 ENCOUNTER — Encounter: Payer: Self-pay | Admitting: Emergency Medicine

## 2011-10-11 DIAGNOSIS — R0781 Pleurodynia: Secondary | ICD-10-CM

## 2011-10-11 DIAGNOSIS — J189 Pneumonia, unspecified organism: Secondary | ICD-10-CM

## 2011-10-11 DIAGNOSIS — N3941 Urge incontinence: Secondary | ICD-10-CM

## 2011-10-11 LAB — POCT CBC W AUTO DIFF (K'VILLE URGENT CARE)

## 2011-10-11 MED ORDER — HYDROCODONE-ACETAMINOPHEN 5-500 MG PO TABS: 1.0000 | ORAL_TABLET | Freq: Every evening | ORAL | Status: AC | PRN

## 2011-10-11 MED ORDER — AZITHROMYCIN 250 MG PO TABS: ORAL_TABLET | ORAL | Status: AC

## 2011-10-11 NOTE — ED Provider Notes (Signed)
History     CSN: [id number] Arrival date & time: 10/11/2011  1:55 PM   First MD Initiated Contact with Patient 10/11/11 1423      Chief Complaint  Patient presents with  . Chest Pain     HPI Comments: Patient complains of onset of left upper abdominal pain yesterday, but points to her left anterior costal margin and lower ribs.  The pain is worse with deep inspiration, cough, and sighing, although she denies cough.  She does not feel shortness of breath, and has not had a fever.  She recalls having increased urine frequency several days ago, but denies dysuria.  She has been somewhat constipated recently.  No other GI symptoms.  Patient is a 77 y.o. female presenting with chest pain. The history is provided by the patient and a relative.  Chest Pain The chest pain began yesterday. Chest pain occurs frequently. The chest pain is worsening. The pain is associated with breathing and coughing. The quality of the pain is described as sharp. The pain does not radiate. Chest pain is worsened by deep breathing (coughing). Primary symptoms include abdominal pain. Pertinent negatives for primary symptoms include no fever, no fatigue, no shortness of breath, no cough, no wheezing, no palpitations, no nausea and no vomiting.     Past Medical History  Diagnosis Date  . Rheumatoid arthritis   . IBS (irritable bowel syndrome)   . Constipation   . Allergy     seasonal  . Cataract     surgery bilateral  . Osteoporosis     Past Surgical History  Procedure Date  . Replacement total knee     left  . Eye surgery     right  . Facial cosmetic surgery     left cheek  . Rotator cuff repair     right  . Cataract extraction   . Bunionectomy     bilateral    Family History  Problem Relation Age of Onset  . Lung cancer Sister   . Uterine cancer Sister   . Colon cancer Neg Hx   . Heart disease Father   . GI problems Son   . Irritable bowel syndrome Son     History  Substance Use Topics    . Smoking status: Former Smoker    Types: Cigarettes    Quit date: 11/25/1997  . Smokeless tobacco: Never Used  . Alcohol Use: No    OB History    Grav Para Term Preterm Abortions TAB SAB Ect Mult Living                  Review of Systems  Constitutional: Negative for fever, activity change and fatigue.  HENT: Negative.   Eyes: Negative.   Respiratory: Negative for cough, shortness of breath and wheezing.   Cardiovascular: Positive for chest pain. Negative for palpitations and leg swelling.  Gastrointestinal: Positive for abdominal pain and constipation. Negative for nausea and vomiting.  Genitourinary: Positive for frequency. Negative for dysuria.  Musculoskeletal: Negative.   Skin: Negative.   Neurological: Negative for headaches.    Allergies  Review of patient's allergies indicates no known allergies.  Home Medications   Current Outpatient Rx  Name Route Sig Dispense Refill  . AZITHROMYCIN 250 MG PO TABS  Take 2 tabs today; then begin one tab once daily for 4 more days.  6 each 0  . CALCIUM CARBONATE 600 MG PO TABS Oral Take 600 mg by mouth 2 (two) times daily with a  meal.      . VITAMIN D 1000 UNITS PO CAPS Oral Take 1,000 Units by mouth daily.      Marland Kitchen FOLIC ACID 1 MG PO TABS Oral Take 1 mg by mouth 2 (two) times daily.      Marland Kitchen HYDROCODONE-ACETAMINOPHEN 5-500 MG PO TABS Oral Take 1 tablet by mouth at bedtime as needed for pain. 10 tablet 0  . HYOSCYAMINE SULFATE 0.375 MG PO TBCR  Take one tab twice a day as needed for abdominal pain 30 each 1  . METHOTREXATE SODIUM 2.5 MG PO TABS  Take 4 by mouth twice a day once weekly     . ONE-A-DAY WOMENS PO Oral Take 1 tablet by mouth daily.      . STOOL SOFTENER LAXATIVE PO Oral Take 1 tablet by mouth daily.      Marland Kitchen SOLIFENACIN SUCCINATE 5 MG PO TABS Oral Take 5 mg by mouth daily.        BP 104/71  Pulse 129  Temp(Src) 99.1 F (37.3 C) (Oral)  Resp 16  Ht 4\' 10"  (1.473 m)  Wt 100 lb (45.36 kg)  BMI 20.90 kg/m2  SpO2  93%  Physical Exam  Nursing note and vitals reviewed. Constitutional: She is oriented to person, place, and time. She appears well-developed and well-nourished. No distress.  HENT:  Head: Normocephalic.  Mouth/Throat: Oropharynx is clear and moist.  Eyes: Conjunctivae are normal. Pupils are equal, round, and reactive to light.  Neck: Neck supple.  Cardiovascular: Regular rhythm, normal heart sounds and intact distal pulses.   Pulmonary/Chest: Effort normal and breath sounds normal. No respiratory distress. She has no wheezes. She has no rales. She exhibits tenderness.       There is tenderness to palpation over the left lower anterior ribs without crepitance or ecchymosis  Abdominal: Soft. Bowel sounds are normal. There is no tenderness. There is no rebound and no guarding.  Musculoskeletal: She exhibits no edema.       No lower leg tenderness, erythema, or warmth  Lymphadenopathy:    She has no cervical adenopathy.  Neurological: She is alert and oriented to person, place, and time.  Skin: Skin is warm and dry. No rash noted.  Psychiatric: She has a normal mood and affect.    ED Course  Procedures None  Note pulse ox 93% Labs Reviewed - CBC:  WBC 8.7; LY 9.3; MO 4.7; GR 86.0; Hgb 14.4   *RADIOLOGY REPORT*  Clinical Data: Left-sided chest pain and anterior rib pain, no  trauma  LEFT RIBS AND CHEST - 3+ VIEW  Comparison: 09/05/2011  Findings: Lower thoracic vertebral body compression deformity again  noted but not well seen due to technique. Chronically prominent  interstitial markings are noted. There is superimposed new patchy  left lower lobe airspace disease and small left pleural effusion.  Lungs are hyper inflated suggestive of COPD. No displaced rib  fracture is identified. Heart size is normal. No pneumothorax.  IMPRESSION:  New patchy left lower lobe airspace opacity and small left  effusion. This could represent pneumonia given the history of  chest pain. Other  alveolar filling processes would be less likely  given the absence of trauma. No displaced rib fracture.  Original Report Authenticated By: Harrel Lemon, M.D.    1. Pneumonia       MDM  Attempted to obtain urinalysis; patient unable to obtain specimen Begin Azithromycin.  Begin expectorant and plenty of fluids.  Given Lortab for use at bedtime as  cough suppressant and for analgesia.  Avoid antihistamines for now. Followup with PCP in 10 days, earlier for worsening symptoms.        Donna Christen, MD 10/13/11 2142

## 2011-10-11 NOTE — ED Notes (Signed)
Chest discomfort when patient tries to take a deep breath, sneezes or coughs since yesterday

## 2011-10-28 NOTE — Progress Notes (Signed)
Summary: RECHECK ARM (procedure rm)   Vital Signs:  Patient Profile:   77 Years Old Female CC:      wound f/u Height:     60 inches Weight:      112 pounds O2 Sat:      95 % O2 treatment:    Room Air Temp:     98.8 degrees F oral Pulse rate:   88 / minute Resp:     14 per minute BP sitting:   116 / 72  (right arm) Cuff size:   regular  Vitals Entered By: Lajean Saver RN (June 03, 2011 8:23 AM)                  Updated Prior Medication List: METHOTREXATE 2.5 MG TABS (METHOTREXATE SODIUM)  CELEBREX 100 MG CAPS (CELECOXIB)  FOLIC ACID 1 MG TABS (FOLIC ACID)  CALCIUM 600 1500 MG TABS (CALCIUM CARBONATE)  * VITAMIN D  VESICARE 5 MG TABS (SOLIFENACIN SUCCINATE)  CEPHALEXIN 500 MG CAPS (CEPHALEXIN) One by mouth two times a day  Current Allergies: No known allergies History of Present Illness Chief Complaint: wound f/u History of Present Illness:  Subjective:  Patient reports no problems with the wound on her left arm, but complains of some itching where the ace wrap was applied.  No pain. She complains of an additional problem of a long history of recurrent cold sores on her lips, recurring every several months.  She has a mild case at present on both upper and lower lips not responding to lysine  REVIEW OF SYSTEMS Constitutional Symptoms      Denies fever, chills, night sweats, weight loss, weight gain, and fatigue.  Eyes       Complains of glasses.      Denies change in vision, eye pain, eye discharge, contact lenses, and eye surgery. Ear/Nose/Throat/Mouth       Denies hearing loss/aids, change in hearing, ear pain, ear discharge, dizziness, frequent runny nose, frequent nose bleeds, sinus problems, sore throat, hoarseness, and tooth pain or bleeding.  Respiratory       Denies dry cough, productive cough, wheezing, shortness of breath, asthma, bronchitis, and emphysema/COPD.  Cardiovascular       Denies murmurs, chest pain, and tires easily with exhertion.     Gastrointestinal       Denies stomach pain, nausea/vomiting, diarrhea, constipation, blood in bowel movements, and indigestion. Genitourniary       Denies painful urination, kidney stones, and loss of urinary control. Neurological       Denies paralysis, seizures, and fainting/blackouts. Musculoskeletal       Denies muscle pain, joint pain, joint stiffness, decreased range of motion, redness, swelling, muscle weakness, and gout.  Skin       Denies bruising, unusual mles/lumps or sores, and hair/skin or nail changes.  Psych       Denies mood changes, temper/anger issues, anxiety/stress, speech problems, depression, and sleep problems.  Past History:  Past Medical History: Reviewed history from 05/31/2011 and no changes required. Rheumatoid arthritis IBS  Past Surgical History: Reviewed history from 05/31/2011 and no changes required. Total knee replacement Rotator cuff repair- right  Social History: Reviewed history from 05/31/2011 and no changes required. Never Smoked Alcohol use-no Drug use-no   Objective:  Appearance:  Patient appears healthy, stated age, and in no acute distress.  She is alert and oriented  Lips:  Mild patchy erythema upper and lower lips but no specific lesions.  Minimal swelling Left arm  after removal bandage:  No swelling, erythema, or drainage.  Re-apposed skin appears viable.  Steri-strips remain well-adhered.   Skin around injury where ace wrap had been applied reveals no erythema or signs of inflammation. Assessment  Assessed ELB FORARM&WRST ABRASION/FRICION BURN W/O INF as improved - Donna Christen MD New Problems: PRURITUS (ICD-698.9) ENCOUNTER CHG/REMOVAL NONSURGICAL WOUND DRESSING (ICD-V58.30) HERPETIC GINGIVOSTOMATITIS (ICD-054.2)  SKIN TEAR HEALING APPROPRIATELY WITHOU EVIDENCE INFECTION. MILD PRURITIS ARM FROM ACE WRAP  Plan New Medications/Changes: ZOVIRAX 5 % CREA (ACYCLOVIR) Apply 5x/day x 4 days.  Start at symptoms onset  #1  tube, 5gm x 1, 06/03/2011, Donna Christen MD  New Orders: Duoderm Dressing [A6250] Est. Patient Level IV [16109] Planning Comments:   Continue Keflex.   Applied small amount Silvadene cream to wound edges.  Applied Mepelex Lite dressing to skin tear wound and secured with a stretchable "Bulkee Petite Gauze Bandage."  Applied triamcinolone cream to reported area of itching on forearm.  Advised to keep wound clean/dry.  Leave dressing in place without disturbing for 4 days, then return for re-check.  If patient has continued itching on forearm, she may use 1% hydrocortisone cream as needed. Begin Zovirax cream for recurrent cold sores on lips.    Diagnoses and expected course of recovery discussed and will return if not improved as expected or if the condition worsens. Patient and/or caregiver verbalized understanding.  Prescriptions: ZOVIRAX 5 % CREA (ACYCLOVIR) Apply 5x/day x 4 days.  Start at symptoms onset  #1 tube, 5gm x 1   Entered and Authorized by:   Donna Christen MD   Signed by:   Donna Christen MD on 06/03/2011   Method used:   Print then Give to Patient   RxID:   [id number]   Orders Added: 1)  Duoderm Dressing [A6250] 2)  Est. Patient Level IV [95621]

## 2011-10-28 NOTE — Progress Notes (Signed)
Summary: rechck arm procedure rm   Vital Signs:  Patient Profile:   77 Years Old Female CC:      recheck arm Height:     60 inches O2 Sat:      96 % O2 treatment:    Room Air Temp:     98.6 degrees F oral Pulse rate:   83 / minute Resp:     16 per minute BP sitting:   102 / 57  (left arm) Cuff size:   regular  Vitals Entered By: Clemens Catholic LPN (June 07, 2011 2:01 PM)                  Updated Prior Medication List: METHOTREXATE 2.5 MG TABS (METHOTREXATE SODIUM)  CELEBREX 100 MG CAPS (CELECOXIB)  FOLIC ACID 1 MG TABS (FOLIC ACID)  CALCIUM 600 1500 MG TABS (CALCIUM CARBONATE)  * VITAMIN D  VESICARE 5 MG TABS (SOLIFENACIN SUCCINATE)  CEPHALEXIN 500 MG CAPS (CEPHALEXIN) One by mouth two times a day ZOVIRAX 5 % CREA (ACYCLOVIR) Apply 5x/day x 4 days.  Start at symptoms onset  Current Allergies (reviewed today): No known allergies History of Present Illness Chief Complaint: recheck arm History of Present Illness:  Subjective:  Patient has no complaints.  No pain at skin tear site.  REVIEW OF SYSTEMS Constitutional Symptoms      Denies fever, chills, night sweats, weight loss, weight gain, and fatigue.  Eyes       Denies change in vision, eye pain, eye discharge, glasses, contact lenses, and eye surgery. Ear/Nose/Throat/Mouth       Denies hearing loss/aids, change in hearing, ear pain, ear discharge, dizziness, frequent runny nose, frequent nose bleeds, sinus problems, sore throat, hoarseness, and tooth pain or bleeding.  Respiratory       Denies dry cough, productive cough, wheezing, shortness of breath, asthma, bronchitis, and emphysema/COPD.  Cardiovascular       Denies murmurs, chest pain, and tires easily with exhertion.    Gastrointestinal       Denies stomach pain, nausea/vomiting, diarrhea, constipation, blood in bowel movements, and indigestion. Genitourniary       Denies painful urination, kidney stones, and loss of urinary control. Neurological  Denies paralysis, seizures, and fainting/blackouts. Musculoskeletal       Denies muscle pain, joint pain, joint stiffness, decreased range of motion, redness, swelling, muscle weakness, and gout.  Skin       Denies bruising, unusual mles/lumps or sores, and hair/skin or nail changes.  Psych       Denies mood changes, temper/anger issues, anxiety/stress, speech problems, depression, and sleep problems. Other Comments: the pt is here today for a recheck on her LT forearm.   Past History:  Past Medical History: Reviewed history from 05/31/2011 and no changes required. Rheumatoid arthritis IBS  Past Surgical History: Reviewed history from 05/31/2011 and no changes required. Total knee replacement Rotator cuff repair- right  Family History: Reviewed history and no changes required.  Social History: Reviewed history from 05/31/2011 and no changes required. Never Smoked Alcohol use-no Drug use-no   Objective:  Appears comfortable and alert Left arm after removal of bandage:  Skin tear healing well.  Removed Steri-strips.  Wound edges well adherent and skin is viable.  No drainage.  No swelling or tenderness.  Minimal erythema. Assessment  Assessed ENCOUNTER CHG/REMOVAL NONSURGICAL WOUND DRESSING as improved - Donna Christen MD WOUND HEALING WELL WITHOUT EVIDENCE INFECTION  Plan New Medications/Changes: CEPHALEXIN 500 MG CAPS (CEPHALEXIN) One by mouth two  times a day  #10 x 0, 06/07/2011, Donna Christen MD  New Orders: Est. Patient Level II [16109] Duoderm Dressing [A6250] Planning Comments:   Applied thin layer of Silvadene to wound, followed by 10cm by 10cm Mepelex Border dressing.  Applied light wrap of 2inch Ace wrap.  Continue Keflex.  Leave dressing on for 4 days.  Keep wound clean and dry.  At that time may continue a daily dressing with Bacitracin and Telfa.  Patient will be out of town; recommend she follow-up with health provider if she has any problems with wound     Diagnoses and expected course of recovery discussed and will return if not improved as expected or if the condition worsens. Patient and/or caregiver verbalized understanding.  Prescriptions: CEPHALEXIN 500 MG CAPS (CEPHALEXIN) One by mouth two times a day  #10 x 0   Entered and Authorized by:   Donna Christen MD   Signed by:   Donna Christen MD on 06/07/2011   Method used:   Print then Give to Patient   RxID:   [id number]   Orders Added: 1)  Est. Patient Level II [95621] 2)  Duoderm Dressing [A6250]

## 2011-10-28 NOTE — Progress Notes (Signed)
Summary: fall (procedure room)   Vital Signs:  Patient Profile:   77 Years Old Female CC:      left arm skin tear post fall this AM Height:     60 inches Weight:      112 pounds O2 Sat:      96 % O2 treatment:    Room Air Temp:     98.4 degrees F oral Pulse rate:   80 / minute Resp:     16 per minute BP sitting:   115 / 73  (left arm) Cuff size:   regular  Pt. in pain?   no  Vitals Entered By: Lajean Saver RN (May 31, 2011 9:19 AM)                   Updated Prior Medication List: METHOTREXATE 2.5 MG TABS (METHOTREXATE SODIUM)  CELEBREX 100 MG CAPS (CELECOXIB)  FOLIC ACID 1 MG TABS (FOLIC ACID)  CALCIUM 600 1500 MG TABS (CALCIUM CARBONATE)  * VITAMIN D  VESICARE 5 MG TABS (SOLIFENACIN SUCCINATE)   Current Allergies: No known allergies History of Present Illness Chief Complaint: left arm skin tear post fall this AM History of Present Illness:  Subjective:  Patient complains of losing her while in bathroom early this morning and scraping her left forearm resulting in a skin tear.  She denies light-headedness or dizziness.  Does not remember her last tetanus shot.  REVIEW OF SYSTEMS Constitutional Symptoms      Denies fever, chills, night sweats, weight loss, weight gain, and fatigue.  Eyes       Denies change in vision, eye pain, eye discharge, glasses, contact lenses, and eye surgery. Ear/Nose/Throat/Mouth       Denies hearing loss/aids, change in hearing, ear pain, ear discharge, dizziness, frequent runny nose, frequent nose bleeds, sinus problems, sore throat, hoarseness, and tooth pain or bleeding.  Respiratory       Denies dry cough, productive cough, wheezing, shortness of breath, asthma, bronchitis, and emphysema/COPD.  Cardiovascular       Denies murmurs, chest pain, and tires easily with exhertion.    Gastrointestinal       Denies stomach pain, nausea/vomiting, diarrhea, constipation, blood in bowel movements, and indigestion. Genitourniary  Denies painful urination, kidney stones, and loss of urinary control. Neurological       Denies paralysis, seizures, and fainting/blackouts. Musculoskeletal       Denies muscle pain, joint pain, joint stiffness, decreased range of motion, redness, swelling, muscle weakness, and gout.  Skin       Denies bruising, unusual mles/lumps or sores, and hair/skin or nail changes.  Psych       Denies mood changes, temper/anger issues, anxiety/stress, speech problems, depression, and sleep problems. Other Comments: Patient has a skin tear to her left FA post a fall @ 2am this AM. She denies hitting her head and is not on any anti-coagulants. Her last TDaP is >10 years   Past History:  Past Medical History: Rheumatoid arthritis IBS  Past Surgical History: Total knee replacement Rotator cuff repair- right  Social History: Never Smoked Alcohol use-no Drug use-no Smoking Status:  never Drug Use:  no   Objective:  Appearance:  Elderly female who appears healthy, stated age, and in no acute distress  Eyes:  Pupils are equal, round, and reactive to light and accomodation.  Extraocular movement is intact.  Conjunctivae are not inflamed.  Mouth:  moist mucous membranes  Neck:  Supple. Lungs:  Clear to auscultation.  Breath sounds are equal.  Heart:  Regular rate and rhythm without murmurs, rubs, or gallops.  Abdomen:  Nontender without masses or hepatosplenomegaly.  Bowel sounds are present.  No CVA or flank tenderness.  Extremities:  No lower leg edema Skin:  left forearm just below elbow reveals a simple semi-circular skin tear approximately 10cm along its periphery.  No swelling or hematoma.  Elbow has full range of motion.  Minimal surrounding tenderness.  Distal neurovascular intact  Assessment New Problems: ELB FORARM&WRST ABRASION/FRICION BURN W/O INF (ICD-913.0) RHEUMATOID ARTHRITIS (ICD-714.0)   Plan New Medications/Changes: CEPHALEXIN 500 MG CAPS (CEPHALEXIN) One by mouth two  times a day  #20 x 0, 05/31/2011, Donna Christen MD  New Orders: Tdap => 24yrs IM [90715] Admin 1st Vaccine [90471] Repair Laceration Intermediate 7.6-12.5 CM [12034] Duoderm Dressing [A6250] Ace  Bandage < 3in. [W0981] New Patient Level III [19147] Planning Comments:   Sling applied.  Advised patient to leave dressing in place for 3 days then return for follow-up.  Return earlier for increasing pain, swelling, etc.  Advised to keep dressing clean and dry.   Begin Keflex.  Tdap given.  May take Tylenol for pain.   The patient and/or caregiver has been counseled thoroughly with regard to medications prescribed including dosage, schedule, interactions, rationale for use, and possible side effects and they verbalize understanding.  Diagnoses and expected course of recovery discussed and will return if not improved as expected or if the condition worsens. Patient and/or caregiver verbalized understanding.   PROCEDURE:  Dressing Site: Left forearm Size: 10cm Procedure: Skin Tear repair:  With sterile technique, cleansed wound with Hibiclens and saline.  Reapproximated epidermal edges with Steri-Strips.  Applied Mepelex Lite 10cm by 10cm dressing followed by light compression dressing with 3inch ace wrap. Prescriptions: CEPHALEXIN 500 MG CAPS (CEPHALEXIN) One by mouth two times a day  #20 x 0   Entered and Authorized by:   Donna Christen MD   Signed by:   Donna Christen MD on 05/31/2011   Method used:   Electronically to        [facility] [phone]* (retail)       [address]       [place], [place]  [address]       Ph: [phone] or [phone]       Fax: [phone]   RxID:   [id number]   Orders Added: 1)  Tdap => 39yrs IM [90715] 2)  Admin 1st Vaccine [90471] 3)  Repair Laceration Intermediate 7.6-12.5 CM [12034] 4)  Duoderm Dressing [A6250] 5)  Ace  Bandage < 3in. [I4332] 6)  New Patient Level III [95188]   Immunizations Administered:  Tetanus Vaccine:    Vaccine Type:  Tdap    Site: right deltoid    Mfr: GlaxoSmithKline    Dose: 0.5 ml    Route: IM    Given by: Lajean Saver RN    Exp. Date: 10/18/2012    Lot #: [id number]    VIS given: 10/12/08 version given May 31, 2011.   Immunizations Administered:  Tetanus Vaccine:    Vaccine Type: Tdap    Site: right deltoid    Mfr: GlaxoSmithKline    Dose: 0.5 ml    Route: IM    Given by: Lajean Saver RN    Exp. Date: 10/18/2012    Lot #: [id number]    VIS given: 10/12/08 version given May 31, 2011.

## 2012-05-21 ENCOUNTER — Encounter (HOSPITAL_BASED_OUTPATIENT_CLINIC_OR_DEPARTMENT_OTHER): Payer: Self-pay | Admitting: *Deleted

## 2012-05-21 ENCOUNTER — Emergency Department (HOSPITAL_BASED_OUTPATIENT_CLINIC_OR_DEPARTMENT_OTHER)
Admission: EM | Admit: 2012-05-21 | Discharge: 2012-05-22 | Disposition: A | Discharge status: Home / Self Care | Payer: Medicare HMO | Source: Home / Self Care | Attending: Emergency Medicine | Admitting: Emergency Medicine

## 2012-05-21 DIAGNOSIS — K589 Irritable bowel syndrome without diarrhea: Secondary | ICD-10-CM | POA: Insufficient documentation

## 2012-05-21 DIAGNOSIS — M069 Rheumatoid arthritis, unspecified: Secondary | ICD-10-CM | POA: Insufficient documentation

## 2012-05-21 DIAGNOSIS — Z87891 Personal history of nicotine dependence: Secondary | ICD-10-CM | POA: Insufficient documentation

## 2012-05-21 DIAGNOSIS — M549 Dorsalgia, unspecified: Secondary | ICD-10-CM | POA: Insufficient documentation

## 2012-05-21 DIAGNOSIS — M5431 Sciatica, right side: Secondary | ICD-10-CM

## 2012-05-21 DIAGNOSIS — J309 Allergic rhinitis, unspecified: Secondary | ICD-10-CM | POA: Insufficient documentation

## 2012-05-21 DIAGNOSIS — M810 Age-related osteoporosis without current pathological fracture: Secondary | ICD-10-CM | POA: Insufficient documentation

## 2012-05-21 DIAGNOSIS — Z79899 Other long term (current) drug therapy: Secondary | ICD-10-CM | POA: Insufficient documentation

## 2012-05-21 DIAGNOSIS — M5430 Sciatica, unspecified side: Secondary | ICD-10-CM | POA: Insufficient documentation

## 2012-05-21 DIAGNOSIS — Z96659 Presence of unspecified artificial knee joint: Secondary | ICD-10-CM | POA: Insufficient documentation

## 2012-05-21 HISTORY — DX: Sciatica, unspecified side: M54.30

## 2012-05-21 NOTE — ED Notes (Signed)
Pt brought in by EMS: pt reports two days of lower back pain that radiates into the RLE and groin. Hx sciatica which she has previously been treated for. Pt accompanied by family member.

## 2012-05-22 MED ORDER — OXYCODONE HCL 10 MG PO TB12: 10.0000 mg | ORAL_TABLET | Freq: Two times a day (BID) | ORAL | Status: DC

## 2012-05-22 MED ORDER — FENTANYL CITRATE 0.05 MG/ML IJ SOLN: 100.0000 ug | Freq: Once | INTRAMUSCULAR | Status: DC

## 2012-05-22 MED ORDER — OXYCODONE-ACETAMINOPHEN 5-325 MG PO TABS
1.0000 | ORAL_TABLET | Freq: Once | ORAL | Status: AC
  Administered 2012-05-22: 1 via ORAL
  Filled 2012-05-22: qty 1

## 2012-05-22 MED ORDER — FENTANYL CITRATE 0.05 MG/ML IJ SOLN
100.0000 ug | Freq: Once | INTRAMUSCULAR | Status: AC
  Administered 2012-05-22: 100 ug via INTRAMUSCULAR
  Filled 2012-05-22: qty 2

## 2012-05-22 NOTE — ED Notes (Signed)
Left a voice message regarding pt's slippers accidentally being left in the room. Informed pt to come to [facility] and ask for lost and found to retreive slippers.

## 2012-05-22 NOTE — ED Notes (Signed)
EMT attempted to get pt in wheelchair, and pt refused due to severe pain. Pt requested more time for the fentanyl shot to be effective. Will reassess at 0445 pt's ability to move and get into wheelchair without great difficulty.

## 2012-05-22 NOTE — ED Notes (Signed)
Pt and pt's son is requesting alternative medication for pain mgmt instead of vicodin and tramadol. States present medications not helping with the pain.

## 2012-05-22 NOTE — ED Notes (Signed)
Pt reports having chronic back and sciatica pain down her right leg. States that vcicodin and tramadol usually helps with the pain. However, pt went to the chiropractor this past Tuesday and thinks that he was too hard on her and that her pain is worse now and cannot be controlled with present meds.

## 2012-05-22 NOTE — ED Notes (Signed)
Contacted [facility] for transport to home-MT

## 2012-05-22 NOTE — ED Notes (Signed)
MD at bedside. 

## 2012-05-22 NOTE — ED Notes (Signed)
EMT attempted to get pt in a wheelchair. Pt refused due to "excruciating pain". Son concerned that he will not be able to get pt out of the wheelchair into the car and out of the car and into the pt's house. [facility] called to transport pt back home.

## 2012-05-22 NOTE — ED Notes (Signed)
Attempted to get patient OOB with assistance.  Patient unable to tolerate pain in order to be moved from bed to wheelchair; Family at bedside concerned with her pain/comfort level with traveling via POV also.  RN aware.

## 2012-05-22 NOTE — ED Provider Notes (Signed)
History     CSN: [id number]  Arrival date & time 05/21/12  2355   First MD Initiated Contact with Patient 05/22/12 0329      Chief Complaint  Patient presents with  . Back Pain    (Consider location/radiation/quality/duration/timing/severity/associated sxs/prior treatment) HPI This is an 78 year old white female with a history of sciatica. She was removing her support hose several days ago and felt an acute exacerbation of her sciatic pain. The pain is located in the right buttock and radiates down the leg. It is moderate to severe. It is worse with movement of the right hip. She has not been able to ambulate. The pain is improved by keeping the right hip and knee flexed. She denies any numbness or weakness with this nor is there any acute bowel or bladder changes. She has taken hydrocodone as well as tramadol without relief.  Past Medical History  Diagnosis Date  . Rheumatoid arthritis   . IBS (irritable bowel syndrome)   . Constipation   . Allergy     seasonal  . Cataract     surgery bilateral  . Osteoporosis   . Sciatica     Past Surgical History  Procedure Date  . Replacement total knee     left  . Eye surgery     right  . Facial cosmetic surgery     left cheek  . Rotator cuff repair     right  . Cataract extraction   . Bunionectomy     bilateral    Family History  Problem Relation Age of Onset  . Lung cancer Sister   . Uterine cancer Sister   . Colon cancer Neg Hx   . Heart disease Father   . GI problems Son   . Irritable bowel syndrome Son     History  Substance Use Topics  . Smoking status: Former Smoker    Types: Cigarettes    Quit date: 11/25/1997  . Smokeless tobacco: Never Used  . Alcohol Use: No    OB History    Grav Para Term Preterm Abortions TAB SAB Ect Mult Living                  Review of Systems  All other systems reviewed and are negative.    Allergies  Review of patient's allergies indicates no known allergies.  Home  Medications   Current Outpatient Rx  Name Route Sig Dispense Refill  . CALCIUM CARBONATE 600 MG PO TABS Oral Take 600 mg by mouth 2 (two) times daily with a meal.      . VITAMIN D 1000 UNITS PO CAPS Oral Take 1,000 Units by mouth daily.      Marland Kitchen FOLIC ACID 1 MG PO TABS Oral Take 1 mg by mouth 2 (two) times daily.      Marland Kitchen HYDROCODONE-ACETAMINOPHEN 5-500 MG PO TABS Oral Take 1 tablet by mouth every 6 (six) hours as needed.    . METHOTREXATE SODIUM 2.5 MG PO TABS  Take 4 by mouth twice a day once weekly     . ONE-A-DAY WOMENS PO Oral Take 1 tablet by mouth daily.      . STOOL SOFTENER LAXATIVE PO Oral Take 1 tablet by mouth daily.      Marland Kitchen SOLIFENACIN SUCCINATE 5 MG PO TABS Oral Take 5 mg by mouth daily.      . TRAMADOL HCL 50 MG PO TABS Oral Take 100 mg by mouth every 6 (six) hours as needed.    Marland Kitchen  HYOSCYAMINE SULFATE ER 0.375 MG PO TBCR  Take one tab twice a day as needed for abdominal pain 30 each 1    BP 120/60  Pulse 90  Temp 98.6 F (37 C) (Oral)  Resp 20  SpO2 94%  Physical Exam General: Well-developed, well-nourished female in no acute distress; appearance consistent with age of record HENT: normocephalic, atraumatic Eyes: pupils equal round and reactive to light; extraocular muscles intact Neck: supple Heart: regular rate and rhythm Lungs: clear to auscultation bilaterally Abdomen: soft; nondistended; nontender Back: Right sciatic notch tenderness Extremities: No deformity; decreased range of motion of right hip due to pain Neurologic: Awake, alert and oriented; motor function intact in all extremities and symmetric; no facial droop; sensation intact in lower extremities Skin: Warm and dry Psychiatric: Normal mood and affect    ED Course  Procedures (including critical care time)     MDM  Patient had x-rays 2 weeks ago. In the absence of acute trauma x-rays or not indicated at this time.         Hanley Seamen, MD 05/22/12 [phone]

## 2012-05-25 ENCOUNTER — Emergency Department (HOSPITAL_COMMUNITY): Payer: Medicare HMO

## 2012-05-25 ENCOUNTER — Encounter (HOSPITAL_COMMUNITY): Payer: Self-pay | Admitting: *Deleted

## 2012-05-25 ENCOUNTER — Inpatient Hospital Stay (HOSPITAL_COMMUNITY)
Admission: EM | Admit: 2012-05-25 | Discharge: 2012-05-30 | DRG: 469 | Disposition: A | Discharge status: Skilled Nursing Facility | Payer: Medicare HMO | Source: Ambulatory Visit | Attending: Internal Medicine | Admitting: Internal Medicine

## 2012-05-25 DIAGNOSIS — M069 Rheumatoid arthritis, unspecified: Secondary | ICD-10-CM

## 2012-05-25 DIAGNOSIS — M810 Age-related osteoporosis without current pathological fracture: Secondary | ICD-10-CM | POA: Diagnosis present

## 2012-05-25 DIAGNOSIS — Z96659 Presence of unspecified artificial knee joint: Secondary | ICD-10-CM

## 2012-05-25 DIAGNOSIS — M5430 Sciatica, unspecified side: Secondary | ICD-10-CM | POA: Diagnosis present

## 2012-05-25 DIAGNOSIS — D62 Acute posthemorrhagic anemia: Secondary | ICD-10-CM | POA: Diagnosis not present

## 2012-05-25 DIAGNOSIS — D638 Anemia in other chronic diseases classified elsewhere: Secondary | ICD-10-CM | POA: Diagnosis not present

## 2012-05-25 DIAGNOSIS — K589 Irritable bowel syndrome without diarrhea: Secondary | ICD-10-CM | POA: Diagnosis present

## 2012-05-25 DIAGNOSIS — Z8249 Family history of ischemic heart disease and other diseases of the circulatory system: Secondary | ICD-10-CM

## 2012-05-25 DIAGNOSIS — Z79899 Other long term (current) drug therapy: Secondary | ICD-10-CM

## 2012-05-25 DIAGNOSIS — S72001A Fracture of unspecified part of neck of right femur, initial encounter for closed fracture: Secondary | ICD-10-CM

## 2012-05-25 DIAGNOSIS — B9689 Other specified bacterial agents as the cause of diseases classified elsewhere: Secondary | ICD-10-CM | POA: Diagnosis present

## 2012-05-25 DIAGNOSIS — Z800 Family history of malignant neoplasm of digestive organs: Secondary | ICD-10-CM

## 2012-05-25 DIAGNOSIS — Z7982 Long term (current) use of aspirin: Secondary | ICD-10-CM

## 2012-05-25 DIAGNOSIS — S72019A Unspecified intracapsular fracture of unspecified femur, initial encounter for closed fracture: Secondary | ICD-10-CM

## 2012-05-25 DIAGNOSIS — Z8049 Family history of malignant neoplasm of other genital organs: Secondary | ICD-10-CM

## 2012-05-25 DIAGNOSIS — J189 Pneumonia, unspecified organism: Secondary | ICD-10-CM | POA: Diagnosis present

## 2012-05-25 DIAGNOSIS — M84453A Pathological fracture, unspecified femur, initial encounter for fracture: Principal | ICD-10-CM | POA: Diagnosis present

## 2012-05-25 DIAGNOSIS — Z87891 Personal history of nicotine dependence: Secondary | ICD-10-CM

## 2012-05-25 DIAGNOSIS — N390 Urinary tract infection, site not specified: Secondary | ICD-10-CM | POA: Diagnosis present

## 2012-05-25 DIAGNOSIS — A498 Other bacterial infections of unspecified site: Secondary | ICD-10-CM | POA: Diagnosis present

## 2012-05-25 DIAGNOSIS — S72009A Fracture of unspecified part of neck of unspecified femur, initial encounter for closed fracture: Secondary | ICD-10-CM

## 2012-05-25 DIAGNOSIS — R350 Frequency of micturition: Secondary | ICD-10-CM

## 2012-05-25 DIAGNOSIS — Z801 Family history of malignant neoplasm of trachea, bronchus and lung: Secondary | ICD-10-CM

## 2012-05-25 DIAGNOSIS — Z96649 Presence of unspecified artificial hip joint: Secondary | ICD-10-CM | POA: Diagnosis present

## 2012-05-25 HISTORY — DX: Peripheral vascular disease, unspecified: I73.9

## 2012-05-25 HISTORY — DX: Pneumonia, unspecified organism: J18.9

## 2012-05-25 LAB — COMPREHENSIVE METABOLIC PANEL
ALT: 8 U/L (ref 0–35)
AST: 17 U/L (ref 0–37)
Albumin: 2.7 g/dL — ABNORMAL LOW (ref 3.5–5.2)
CO2: 27 mEq/L (ref 19–32)
Calcium: 8.7 mg/dL (ref 8.4–10.5)
Chloride: 101 mEq/L (ref 96–112)
GFR calc non Af Amer: 82 mL/min — ABNORMAL LOW (ref >90)
Sodium: 137 mEq/L (ref 135–145)

## 2012-05-25 LAB — CBC WITH DIFFERENTIAL/PLATELET
Basophils Relative: 0 % (ref 0–1)
HCT: 41.2 % (ref 36.0–46.0)
Hemoglobin: 14.1 g/dL (ref 12.0–15.0)
Lymphocytes Relative: 8 % — ABNORMAL LOW (ref 12–46)
Lymphs Abs: 0.9 10*3/uL (ref 0.7–4.0)
MCHC: 34.2 g/dL (ref 30.0–36.0)
Monocytes Relative: 11 % (ref 3–12)
Neutro Abs: 9.2 10*3/uL — ABNORMAL HIGH (ref 1.7–7.7)
Neutrophils Relative %: 80 % — ABNORMAL HIGH (ref 43–77)
RBC: 4.21 MIL/uL (ref 3.87–5.11)
WBC: 11.5 10*3/uL — ABNORMAL HIGH (ref 4.0–10.5)

## 2012-05-25 LAB — URINALYSIS, ROUTINE W REFLEX MICROSCOPIC
Glucose, UA: NEGATIVE mg/dL
Protein, ur: NEGATIVE mg/dL
Specific Gravity, Urine: 1.024 (ref 1.005–1.030)

## 2012-05-25 LAB — POCT I-STAT TROPONIN I: Troponin i, poc: 0.01 ng/mL (ref 0.00–0.08)

## 2012-05-25 LAB — URINE MICROSCOPIC-ADD ON

## 2012-05-25 MED ORDER — DEXTROSE 5 % IV SOLN
500.0000 mg | Freq: Once | INTRAVENOUS | Status: AC
  Administered 2012-05-25: 500 mg via INTRAVENOUS
  Filled 2012-05-25: qty 500

## 2012-05-25 MED ORDER — HYDROMORPHONE HCL PF 1 MG/ML IJ SOLN
0.5000 mg | Freq: Once | INTRAMUSCULAR | Status: AC
  Administered 2012-05-25: 0.5 mg via INTRAVENOUS
  Filled 2012-05-25: qty 1

## 2012-05-25 MED ORDER — SODIUM CHLORIDE 0.9 % IV SOLN
INTRAVENOUS | Status: DC
  Administered 2012-05-25 (×2): via INTRAVENOUS

## 2012-05-25 MED ORDER — HYDROMORPHONE HCL PF 1 MG/ML IJ SOLN: 0.5000 mg | Freq: Once | INTRAMUSCULAR | Status: DC

## 2012-05-25 MED ORDER — DEXTROSE 5 % IV SOLN
1.0000 g | Freq: Once | INTRAVENOUS | Status: AC
  Administered 2012-05-25: 1 g via INTRAVENOUS
  Filled 2012-05-25: qty 10

## 2012-05-25 NOTE — ED Provider Notes (Signed)
History     CSN: [id number]  Arrival date & time 05/25/12  1716   First MD Initiated Contact with Patient 05/25/12 1733      Chief Complaint  Patient presents with  . Fever  . Groin Pain    (Consider location/radiation/quality/duration/timing/severity/associated sxs/prior treatment) Patient is a 78 y.o. female presenting with fever and groin pain. The history is provided by the patient.  Fever Primary symptoms of the febrile illness include fever. Primary symptoms do not include cough, abdominal pain or rash.  Groin Pain Pertinent negatives include no chest pain and no abdominal pain.   patient has had right groin pain to hip pain for the last week or 2. Again after she was attempting to cut this out. She was seen 4 days ago and diagnosed with sciatica. She states that she is now started having some fevers. The pain is uncontrolled with her medications. No dysuria. No weakness, but she's been unable to get up due to the pain. No nausea vomiting or diarrhea. No clear trauma.  Past Medical History  Diagnosis Date  . Rheumatoid arthritis   . IBS (irritable bowel syndrome)   . Constipation   . Allergy     seasonal  . Cataract     surgery bilateral  . Osteoporosis   . Sciatica     Past Surgical History  Procedure Date  . Replacement total knee     left  . Eye surgery     right  . Facial cosmetic surgery     left cheek  . Rotator cuff repair     right  . Cataract extraction   . Bunionectomy     bilateral    Family History  Problem Relation Age of Onset  . Lung cancer Sister   . Uterine cancer Sister   . Colon cancer Neg Hx   . Heart disease Father   . GI problems Son   . Irritable bowel syndrome Son     History  Substance Use Topics  . Smoking status: Former Smoker    Types: Cigarettes    Quit date: 11/25/1997  . Smokeless tobacco: Never Used  . Alcohol Use: No    OB History    Grav Para Term Preterm Abortions TAB SAB Ect Mult Living                   Review of Systems  Constitutional: Positive for fever. Negative for chills and appetite change.  Respiratory: Negative for cough.   Cardiovascular: Negative for chest pain.  Gastrointestinal: Negative for abdominal pain.  Genitourinary: Negative for pelvic pain.  Musculoskeletal: Positive for back pain.  Skin: Negative for rash and wound.  Neurological: Negative for weakness.  Psychiatric/Behavioral: Negative for confusion.    Allergies  Review of patient's allergies indicates no known allergies.  Home Medications   Current Outpatient Rx  Name Route Sig Dispense Refill  . CALCIUM CARBONATE 600 MG PO TABS Oral Take 600 mg by mouth 2 (two) times daily with a meal.      . VITAMIN D 1000 UNITS PO CAPS Oral Take 1,000 Units by mouth daily.      Marland Kitchen FOLIC ACID 1 MG PO TABS Oral Take 1 mg by mouth 2 (two) times daily.      Marland Kitchen GLUCOSAMINE PO Oral Take 5-10 mLs by mouth daily.    Marland Kitchen HYDROCODONE-ACETAMINOPHEN 5-500 MG PO TABS Oral Take 1 tablet by mouth every 6 (six) hours as needed.    . METHOTREXATE  SODIUM 2.5 MG PO TABS Oral Take 20 mg by mouth once a week. Take 8 tablets by mouth every wednesday    . ONE-A-DAY WOMENS PO Oral Take 1 tablet by mouth daily.      . OXYBUTYNIN CHLORIDE ER 10 MG PO TB24 Oral Take 10 mg by mouth daily.    . OXYCODONE HCL ER 10 MG PO TB12 Oral Take 10 mg by mouth every 12 (twelve) hours.    . STOOL SOFTENER LAXATIVE PO Oral Take 1 tablet by mouth daily.      Marland Kitchen SOLIFENACIN SUCCINATE 5 MG PO TABS Oral Take 10 mg by mouth daily.     . TRAMADOL HCL 50 MG PO TABS Oral Take 100 mg by mouth every 6 (six) hours as needed.      BP 149/75  Pulse 77  Temp 99.2 F (37.3 C) (Oral)  Resp 20  SpO2 99%  Physical Exam  Nursing note and vitals reviewed. Constitutional: She is oriented to person, place, and time. She appears well-developed and well-nourished.       Patient appears uncomfortable  HENT:  Head: Normocephalic and atraumatic.  Eyes: EOM are normal. Pupils  are equal, round, and reactive to light.  Neck: Normal range of motion. Neck supple.  Cardiovascular: Regular rhythm and normal heart sounds.   No murmur heard.      Mild tachycardia  Pulmonary/Chest: Effort normal and breath sounds normal. No respiratory distress. She has no wheezes. She has no rales.  Abdominal: Soft. Bowel sounds are normal. She exhibits no distension. There is no tenderness. There is no rebound and no guarding.  Musculoskeletal:       Pain with movement of right hip. Less tenderness to right sciatic area. Pain with palpation of left hip. No rash. Somewhat irritable hip. Neurovascular intact over her foot.  Neurological: She is alert and oriented to person, place, and time. No cranial nerve deficit.  Skin: Skin is warm and dry.  Psychiatric: She has a normal mood and affect. Her speech is normal.    ED Course  Procedures (including critical care time)  Labs Reviewed  CBC WITH DIFFERENTIAL - Abnormal; Notable for the following:    WBC 11.5 (*)     Neutrophils Relative 80 (*)     Neutro Abs 9.2 (*)     Lymphocytes Relative 8 (*)     Monocytes Absolute 1.3 (*)     All other components within normal limits  SEDIMENTATION RATE - Abnormal; Notable for the following:    Sed Rate 40 (*)     All other components within normal limits  URINALYSIS, ROUTINE W REFLEX MICROSCOPIC - Abnormal; Notable for the following:    APPearance CLOUDY (*)     Hgb urine dipstick TRACE (*)     Bilirubin Urine SMALL (*)     Ketones, ur 15 (*)     Leukocytes, UA MODERATE (*)     All other components within normal limits  URINE MICROSCOPIC-ADD ON - Abnormal; Notable for the following:    Squamous Epithelial / LPF FEW (*)     Bacteria, UA FEW (*)     All other components within normal limits  POCT I-STAT TROPONIN I  URINE CULTURE   Dg Chest 1 View  05/25/2012  *RADIOLOGY REPORT*  Clinical Data: Fever and groin pain  CHEST - 1 VIEW  Comparison: None  Findings: The heart size is moderately  enlarged.  Small left pleural effusion is suspected.  Opacity within the  periphery of the left base is noted, suspicious for pneumonia.  Coarsened interstitial markings are identified.  There is a scoliosis deformity affecting the thoracic and lumbar spine.  IMPRESSION:  1.  Left base opacity which may represent pneumonia. 2.  Suspect left pleural effusion.  Original Report Authenticated By: Rosealee Albee, M.D.   Dg Lumbar Spine Complete  05/25/2012  *RADIOLOGY REPORT*  Clinical Data: Back pain.  LUMBAR SPINE - COMPLETE 4+ VIEW  Comparison: Lumbar spine MRI 09/05/2009 and CT scan 09/08/2009.  Findings: There is a severe right convex lumbar scoliosis with associated severe degenerative disc disease and facet disease. There is a remote fracture of T11.  No definite acute lumbar compression fracture.  IMPRESSION: Scoliosis and associated severe degenerative lumbar spondylosis with disc disease and facet disease.  No definite acute fracture.  Original Report Authenticated By: P. Loralie Champagne, M.D.   Dg Hip Complete Right  05/25/2012  *RADIOLOGY REPORT*  Clinical Data: Right hip pain  RIGHT HIP - COMPLETE 2+ VIEW  Comparison: None  Findings: There is a subcapital fracture deformity involving the right femoral neck.  There is a significant proximal displacement of the femur with respect to the femoral head.  IMPRESSION:  1.  Subcapital femoral neck fracture with significant proximal displacement of the femur.  Original Report Authenticated By: Rosealee Albee, M.D.     1. Closed subcapital fracture of femur   2. CAP (community acquired pneumonia)       MDM  Patient with right groin pain for 2 days. Subcapital hip fracture on x-ray. Patient also has pneumonia on x-ray and possible UTI. Patient be given antibiotics. She'll be admitted to medicine with orthotopic. Patient has previously seen in Dr. Luiz Blare.        Juliet Rude. Rubin Payor, MD 05/25/12 2127

## 2012-05-25 NOTE — ED Notes (Signed)
C/o fever & right groin pain x 2 days. Seen 4 days ago for sciatica

## 2012-05-25 NOTE — ED Notes (Signed)
Pt was brought in through EMS. PT son states that pt was seen in a [facility] on June 27th. Pt c/o rt groin pain radiating towards lower back pain for 2 weeks. Pt took a pain pill at 6:00a.m. oxycodone w/o relief. Pt son states she has not been eating, drinking or walking. Pt was diagnosis on June 27th with Sciatica.

## 2012-05-25 NOTE — ED Notes (Signed)
Assist with inserting foley catheter

## 2012-05-25 NOTE — ED Notes (Signed)
MD at bedside. 

## 2012-05-25 NOTE — H&P (Signed)
PATIENT DETAILS Name: Caroline Lewis Age: 78 y.o. Sex: female Date of Birth: [birth year] Admit Date: 05/25/2012 ZOX:[id number], MD   CHIEF COMPLAINT:  Right hip pain for the past 3 weeks.  HPI: Patient is 78 year old Caucasian female with a past medical history of rheumatoid arthritis, severe osteoporosis who was brought to the hospital today for evaluation of the above noted complaint. Apparently patient has had no history of fall, but for the past 3 weeks has been having in in the right hip area. Pain has worsened over the past one week and as a result the patient was brought back to the emergency room today for further evaluation and treatment. Per the history obtained, around 2 weeks ago patient was having issues with him he said he went to a chiropractor, pain worsened after that. For the past 2 weeks patient has been completely nonambulatory and bedbound. Usually she is mostly wheelchair-bound and is able to walk a few steps. Today in the ED she was evaluated, she had a x-ray of the hip done which showed a right hip fracture, I was subsequently called to admit this patient. Patient denies any fever or headache. As noted above she denies any fall. She denies any chest pain or shortness of breath. She denies any abdominal pain or nausea vomiting. She also denies any dysuria. As noted above, patient at her baseline is mostly wheelchair-bound, but she is able to walk a few steps to the bathroom.   ALLERGIES:  No Known Allergies  PAST MEDICAL HISTORY: Past Medical History  Diagnosis Date  . Rheumatoid arthritis   . IBS (irritable bowel syndrome)   . Constipation   . Allergy     seasonal  . Cataract     surgery bilateral  . Osteoporosis   . Sciatica     PAST SURGICAL HISTORY: Past Surgical History  Procedure Date  . Replacement total knee     left  . Eye surgery     right  . Facial cosmetic surgery     left cheek  . Rotator cuff repair     right  . Cataract extraction     . Bunionectomy     bilateral    MEDICATIONS AT HOME: Prior to Admission medications   Medication Sig Start Date End Date Taking? Authorizing Provider  calcium carbonate (OS-CAL) 600 MG TABS Take 600 mg by mouth 2 (two) times daily with a meal.     Yes Historical Provider, MD  Cholecalciferol (VITAMIN D) 1000 UNITS capsule Take 1,000 Units by mouth daily.     Yes Historical Provider, MD  folic acid (FOLVITE) 1 MG tablet Take 1 mg by mouth 2 (two) times daily.     Yes Historical Provider, MD  GLUCOSAMINE PO Take 5-10 mLs by mouth daily.   Yes Historical Provider, MD  HYDROcodone-acetaminophen (VICODIN) 5-500 MG per tablet Take 1 tablet by mouth every 6 (six) hours as needed.   Yes Historical Provider, MD  methotrexate 2.5 MG tablet Take 20 mg by mouth once a week. Take 8 tablets by mouth every wednesday   Yes Historical Provider, MD  Multiple Vitamins-Calcium (ONE-A-DAY WOMENS PO) Take 1 tablet by mouth daily.     Yes Historical Provider, MD  oxybutynin (DITROPAN-XL) 10 MG 24 hr tablet Take 10 mg by mouth daily.   Yes Historical Provider, MD  oxyCODONE (OXYCONTIN) 10 MG 12 hr tablet Take 10 mg by mouth every 12 (twelve) hours. 05/22/12  Yes Hanley Seamen, MD  Sennosides-Docusate Sodium (  STOOL SOFTENER LAXATIVE PO) Take 1 tablet by mouth daily.     Yes Historical Provider, MD  solifenacin (VESICARE) 5 MG tablet Take 10 mg by mouth daily.    Yes Historical Provider, MD  traMADol (ULTRAM) 50 MG tablet Take 100 mg by mouth every 6 (six) hours as needed.   Yes Historical Provider, MD    FAMILY HISTORY: Family History  Problem Relation Age of Onset  . Lung cancer Sister   . Uterine cancer Sister   . Colon cancer Neg Hx   . Heart disease Father   . GI problems Son   . Irritable bowel syndrome Son     SOCIAL HISTORY:  reports that she quit smoking about 14 years ago. Her smoking use included Cigarettes. She has never used smokeless tobacco. She reports that she does not drink alcohol or use  illicit drugs.  REVIEW OF SYSTEMS:  Constitutional:   No  weight loss, night sweats,  Fevers, chills, fatigue.  HEENT:    No headaches, Difficulty swallowing,Tooth/dental problems,Sore throat,  No sneezing, itching, ear ache, nasal congestion, post nasal drip,   Cardio-vascular: No chest pain,  Orthopnea, PND, swelling in lower extremities, anasarca,   dizziness, palpitations  GI:  No heartburn, indigestion, abdominal pain, nausea, vomiting, diarrhea, change in  bowel habits, loss of appetite  Resp: No shortness of breath with exertion or at rest.  No excess mucus, no productive cough, No non-productive cough,  No coughing up of blood.No change in color of mucus.No wheezing.No chest wall deformity  Skin:  no rash or lesions.  GU:  no dysuria, change in color of urine, no urgency or frequency.  No flank pain.  Musculoskeletal: No joint pain or swelling.  No decreased range of motion.  No back pain.  Psych: No change in mood or affect. No depression or anxiety.  No memory loss.   PHYSICAL EXAM: Blood pressure 149/75, pulse 77, temperature 99.2 F (37.3 C), temperature source Oral, resp. rate 20, SpO2 99.00%.  General appearance :Awake, alert, not in any distress. Speech Clear. Not toxic Looking HEENT: Atraumatic and Normocephalic, pupils equally reactive to light and accomodation Neck: supple, no JVD. No cervical lymphadenopathy.  Chest:Good air entry bilaterally, no added sounds  CVS: S1 S2 regular, no murmurs.  Abdomen: Bowel sounds present, Non tender and not distended with no gaurding, rigidity or rebound. Extremities: B/L Lower Ext shows no edema, both legs are warm to touch, with  dorsalis pedis pulses palpable. Neurology: Awake alert, and oriented X 3, CN II-XII intact, Non focal Skin:No Rash Wounds:N/A  LABS ON ADMISSION:  No results found for this basename: NA:2,K:2,CL:2,CO2:2,GLUCOSE:2,BUN:2,CREATININE:2,CALCIUM:2,MG:2,PHOS:2 in the last 72 hours No results  found for this basename: AST:2,ALT:2,ALKPHOS:2,BILITOT:2,PROT:2,ALBUMIN:2 in the last 72 hours No results found for this basename: LIPASE:2,AMYLASE:2 in the last 72 hours  Basename 05/25/12 1821  WBC 11.5*  NEUTROABS 9.2*  HGB 14.1  HCT 41.2  MCV 97.9  PLT 240   No results found for this basename: CKTOTAL:3,CKMB:3,CKMBINDEX:3,TROPONINI:3 in the last 72 hours No results found for this basename: DDIMER:2 in the last 72 hours No components found with this basename: POCBNP:3   RADIOLOGIC STUDIES ON ADMISSION: Dg Chest 1 View  05/25/2012  *RADIOLOGY REPORT*  Clinical Data: Fever and groin pain  CHEST - 1 VIEW  Comparison: None  Findings: The heart size is moderately enlarged.  Small left pleural effusion is suspected.  Opacity within the periphery of the left base is noted, suspicious for pneumonia.  Coarsened interstitial markings  are identified.  There is a scoliosis deformity affecting the thoracic and lumbar spine.  IMPRESSION:  1.  Left base opacity which may represent pneumonia. 2.  Suspect left pleural effusion.  Original Report Authenticated By: Rosealee Albee, M.D.   Dg Lumbar Spine Complete  05/25/2012  *RADIOLOGY REPORT*  Clinical Data: Back pain.  LUMBAR SPINE - COMPLETE 4+ VIEW  Comparison: Lumbar spine MRI 09/05/2009 and CT scan 09/08/2009.  Findings: There is a severe right convex lumbar scoliosis with associated severe degenerative disc disease and facet disease. There is a remote fracture of T11.  No definite acute lumbar compression fracture.  IMPRESSION: Scoliosis and associated severe degenerative lumbar spondylosis with disc disease and facet disease.  No definite acute fracture.  Original Report Authenticated By: P. Loralie Champagne, M.D.   Dg Hip Complete Right  05/25/2012  *RADIOLOGY REPORT*  Clinical Data: Right hip pain  RIGHT HIP - COMPLETE 2+ VIEW  Comparison: None  Findings: There is a subcapital fracture deformity involving the right femoral neck.  There is a significant  proximal displacement of the femur with respect to the femoral head.  IMPRESSION:  1.  Subcapital femoral neck fracture with significant proximal displacement of the femur.  Original Report Authenticated By: Rosealee Albee, M.D.    ASSESSMENT AND PLAN: Present on Admission:  .Hip fracture, right -No obvious history of trauma, has history of severe osteoporosis. Orthopedics has already seen the patient and the plan a  Hip replacement tomorrow afternoon.  -Patient has no significant cardiac history in the past, however has been minimally ambulatory-and given her age she would be at least moderate to high-risk for the proposed surgical procedure. However she is as optimizes she could be given the clinical scenario, family is accepting the risks and willing to proceed with surgery.  Marland KitchenPNA (pneumonia) -Patient is afebrile, does not give a history of cough, however and x-ray findings we'll empirically start the patient on Rocephin and Zithromax.   .Rheumatoid arthritis -Continue with methotrexate   .UTI (lower urinary tract infection) -Will be on Rocephin, get urine culture   .Osteoporosis -Need further optimization of her medications as an outpatient  Further plan will depend as patient's clinical course evolves and further radiologic and laboratory data become available. Patient will be monitored closely.  DVT Prophylaxis: -Lovenox  Code Status: -Full code  Total time spent for admission equals 45 minutes.  Jeoffrey Massed 05/25/2012, 9:41 PM

## 2012-05-25 NOTE — Consult Note (Signed)
Reason for Consult:   Right hip fracture Referring Physician:    EDP  Caroline Lewis is an 78 y.o. female  Who lives at home with family assistance. Marginal ambulator for last several years with hip pain for several weeks.  Sought chiropractic help at one point.  Nevertheless can no longer get OOB and taken to ED several times.  Today xray shows displaced femoral neck fracture.  Has had opposite TKR by Dr Luiz Blare and we are consulted at this time about the hip.  Denies pain elsewhere. Denies falls   Past Medical History  Diagnosis Date  . Rheumatoid arthritis   . IBS (irritable bowel syndrome)   . Constipation   . Allergy     seasonal  . Cataract     surgery bilateral  . Osteoporosis   . Sciatica     Past Surgical History  Procedure Date  . Replacement total knee     left  . Eye surgery     right  . Facial cosmetic surgery     left cheek  . Rotator cuff repair     right  . Cataract extraction   . Bunionectomy     bilateral    Family History  Problem Relation Age of Onset  . Lung cancer Sister   . Uterine cancer Sister   . Colon cancer Neg Hx   . Heart disease Father   . GI problems Son   . Irritable bowel syndrome Son     Social History:  reports that she quit smoking about 14 years ago. Her smoking use included Cigarettes. She has never used smokeless tobacco. She reports that she does not drink alcohol or use illicit drugs.  Allergies: No Known Allergies  Medications: I have reviewed the patient's current medications.  Results for orders placed during the hospital encounter of 05/25/12 (from the past 48 hour(s))  CBC WITH DIFFERENTIAL     Status: Abnormal   Collection Time   05/25/12  6:21 PM      Component Value Range Comment   WBC 11.5 (*) 4.0 - 10.5 K/uL    RBC 4.21  3.87 - 5.11 MIL/uL    Hemoglobin 14.1  12.0 - 15.0 g/dL    HCT 16.1  09.6 - 04.5 %    MCV 97.9  78.0 - 100.0 fL    MCH 33.5  26.0 - 34.0 pg    MCHC 34.2  30.0 - 36.0 g/dL    RDW 40.9   81.1 - 91.4 %    Platelets 240  150 - 400 K/uL    Neutrophils Relative 80 (*) 43 - 77 %    Neutro Abs 9.2 (*) 1.7 - 7.7 K/uL    Lymphocytes Relative 8 (*) 12 - 46 %    Lymphs Abs 0.9  0.7 - 4.0 K/uL    Monocytes Relative 11  3 - 12 %    Monocytes Absolute 1.3 (*) 0.1 - 1.0 K/uL    Eosinophils Relative 1  0 - 5 %    Eosinophils Absolute 0.2  0.0 - 0.7 K/uL    Basophils Relative 0  0 - 1 %    Basophils Absolute 0.0  0.0 - 0.1 K/uL   SEDIMENTATION RATE     Status: Abnormal   Collection Time   05/25/12  6:21 PM      Component Value Range Comment   Sed Rate 40 (*) 0 - 22 mm/hr   URINALYSIS, ROUTINE W REFLEX MICROSCOPIC  Status: Abnormal   Collection Time   05/25/12  6:46 PM      Component Value Range Comment   Color, Urine YELLOW  YELLOW    APPearance CLOUDY (*) CLEAR    Specific Gravity, Urine 1.024  1.005 - 1.030    pH 5.5  5.0 - 8.0    Glucose, UA NEGATIVE  NEGATIVE mg/dL    Hgb urine dipstick TRACE (*) NEGATIVE    Bilirubin Urine SMALL (*) NEGATIVE    Ketones, ur 15 (*) NEGATIVE mg/dL    Protein, ur NEGATIVE  NEGATIVE mg/dL    Urobilinogen, UA 1.0  0.0 - 1.0 mg/dL    Nitrite NEGATIVE  NEGATIVE    Leukocytes, UA MODERATE (*) NEGATIVE   URINE MICROSCOPIC-ADD ON     Status: Abnormal   Collection Time   05/25/12  6:46 PM      Component Value Range Comment   Squamous Epithelial / LPF FEW (*) RARE    WBC, UA 3-6  <3 WBC/hpf    RBC / HPF 0-2  <3 RBC/hpf    Bacteria, UA FEW (*) RARE    Urine-Other MUCOUS PRESENT     POCT I-STAT TROPONIN I     Status: Normal   Collection Time   05/25/12  6:56 PM      Component Value Range Comment   Troponin i, poc 0.01  0.00 - 0.08 ng/mL    Comment 3              Dg Chest 1 View  05/25/2012  *RADIOLOGY REPORT*  Clinical Data: Fever and groin pain  CHEST - 1 VIEW  Comparison: None  Findings: The heart size is moderately enlarged.  Small left pleural effusion is suspected.  Opacity within the periphery of the left base is noted, suspicious for  pneumonia.  Coarsened interstitial markings are identified.  There is a scoliosis deformity affecting the thoracic and lumbar spine.  IMPRESSION:  1.  Left base opacity which may represent pneumonia. 2.  Suspect left pleural effusion.  Original Report Authenticated By: Rosealee Albee, M.D.   Dg Lumbar Spine Complete  05/25/2012  *RADIOLOGY REPORT*  Clinical Data: Back pain.  LUMBAR SPINE - COMPLETE 4+ VIEW  Comparison: Lumbar spine MRI 09/05/2009 and CT scan 09/08/2009.  Findings: There is a severe right convex lumbar scoliosis with associated severe degenerative disc disease and facet disease. There is a remote fracture of T11.  No definite acute lumbar compression fracture.  IMPRESSION: Scoliosis and associated severe degenerative lumbar spondylosis with disc disease and facet disease.  No definite acute fracture.  Original Report Authenticated By: P. Loralie Champagne, M.D.   Dg Hip Complete Right  05/25/2012  *RADIOLOGY REPORT*  Clinical Data: Right hip pain  RIGHT HIP - COMPLETE 2+ VIEW  Comparison: None  Findings: There is a subcapital fracture deformity involving the right femoral neck.  There is a significant proximal displacement of the femur with respect to the femoral head.  IMPRESSION:  1.  Subcapital femoral neck fracture with significant proximal displacement of the femur.  Original Report Authenticated By: Rosealee Albee, M.D.    @ROS @ Blood pressure 149/75, pulse 77, temperature 99.2 F (37.3 C), temperature source Oral, resp. rate 20, SpO2 99.00%.  PHYSICAL EXAM:   Neurologically intact ABD soft Sensation intact distally Intact pulses distally Dorsiflexion/Plantar flexion intact Compartment soft Right leg shortened and externally rotated Other leg and both arms move well  ASSESSMENT:   Right femoral neck fracture  PLAN:  Has completely displaced FNF and needs hemi hip to allow her to sit and get OOB.  Has been marginal ambulator for some time and may never walk again but  needs this surgery to at least get out of pain and sit upright.  Also has early pneumonia and needs to sit up for pulmonary toilet.  Discussed risks of surgical vs nonsurgical treatment and will go forward with surgery tomorrow assuming she is cleared medically.  Will likely need placement and we discussed that as well.   Etoy Mcdonnell G 05/25/2012, 8:59 PM

## 2012-05-26 ENCOUNTER — Encounter (HOSPITAL_COMMUNITY): Payer: Self-pay | Admitting: *Deleted

## 2012-05-26 ENCOUNTER — Inpatient Hospital Stay (HOSPITAL_COMMUNITY): Payer: Medicare HMO | Admitting: *Deleted

## 2012-05-26 ENCOUNTER — Encounter (HOSPITAL_COMMUNITY)
Admission: EM | Disposition: A | Discharge status: Skilled Nursing Facility | Payer: Self-pay | Source: Ambulatory Visit | Attending: Internal Medicine

## 2012-05-26 DIAGNOSIS — R350 Frequency of micturition: Secondary | ICD-10-CM

## 2012-05-26 DIAGNOSIS — S72033A Displaced midcervical fracture of unspecified femur, initial encounter for closed fracture: Secondary | ICD-10-CM

## 2012-05-26 HISTORY — PX: HIP ARTHROPLASTY: SHX981

## 2012-05-26 HISTORY — PX: PARTIAL HIP ARTHROPLASTY: SHX733

## 2012-05-26 LAB — COMPREHENSIVE METABOLIC PANEL
ALT: 10 U/L (ref 0–35)
AST: 20 U/L (ref 0–37)
Albumin: 2.9 g/dL — ABNORMAL LOW (ref 3.5–5.2)
CO2: 28 mEq/L (ref 19–32)
Calcium: 9 mg/dL (ref 8.4–10.5)
Chloride: 100 mEq/L (ref 96–112)
Creatinine, Ser: 0.58 mg/dL (ref 0.50–1.10)
GFR calc non Af Amer: 82 mL/min — ABNORMAL LOW (ref >90)
Sodium: 137 mEq/L (ref 135–145)

## 2012-05-26 LAB — SURGICAL PCR SCREEN
MRSA, PCR: NEGATIVE
MRSA, PCR: NEGATIVE
Staphylococcus aureus: NEGATIVE
Staphylococcus aureus: NEGATIVE

## 2012-05-26 LAB — PROTIME-INR: INR: 1.18 (ref 0.00–1.49)

## 2012-05-26 SURGERY — HEMIARTHROPLASTY, HIP, DIRECT ANTERIOR APPROACH, FOR FRACTURE
Anesthesia: General | Site: Hip | Laterality: Right | Wound class: Clean

## 2012-05-26 MED ORDER — CALCIUM CARBONATE 600 MG PO TABS: 600.0000 mg | ORAL_TABLET | Freq: Two times a day (BID) | ORAL | Status: DC

## 2012-05-26 MED ORDER — ONDANSETRON HCL 4 MG/2ML IJ SOLN
INTRAMUSCULAR | Status: DC | PRN
  Administered 2012-05-26: 4 mg via INTRAVENOUS

## 2012-05-26 MED ORDER — FENTANYL CITRATE 0.05 MG/ML IJ SOLN
INTRAMUSCULAR | Status: DC | PRN
  Administered 2012-05-26: 50 ug via INTRAVENOUS
  Administered 2012-05-26: 100 ug via INTRAVENOUS
  Administered 2012-05-26 (×2): 50 ug via INTRAVENOUS

## 2012-05-26 MED ORDER — METHOCARBAMOL 500 MG PO TABS
500.0000 mg | ORAL_TABLET | Freq: Four times a day (QID) | ORAL | Status: DC | PRN
  Administered 2012-05-26 – 2012-05-29 (×2): 500 mg via ORAL
  Filled 2012-05-26 (×2): qty 1

## 2012-05-26 MED ORDER — SENNA 8.6 MG PO TABS
1.0000 | ORAL_TABLET | Freq: Two times a day (BID) | ORAL | Status: DC
  Administered 2012-05-26 – 2012-05-30 (×9): 8.6 mg via ORAL
  Filled 2012-05-26 (×12): qty 1

## 2012-05-26 MED ORDER — CEFAZOLIN SODIUM 1-5 GM-% IV SOLN
INTRAVENOUS | Status: DC | PRN
  Administered 2012-05-26: 2 g via INTRAVENOUS

## 2012-05-26 MED ORDER — LIDOCAINE HCL (CARDIAC) 20 MG/ML IV SOLN
INTRAVENOUS | Status: DC | PRN
  Administered 2012-05-26: 80 mg via INTRAVENOUS

## 2012-05-26 MED ORDER — VITAMIN D3 25 MCG (1000 UNIT) PO TABS
1000.0000 [IU] | ORAL_TABLET | Freq: Every day | ORAL | Status: DC
  Administered 2012-05-26 – 2012-05-30 (×4): 1000 [IU] via ORAL
  Filled 2012-05-26 (×5): qty 1

## 2012-05-26 MED ORDER — METOCLOPRAMIDE HCL 5 MG/ML IJ SOLN: 5.0000 mg | Freq: Three times a day (TID) | INTRAMUSCULAR | Status: DC | PRN

## 2012-05-26 MED ORDER — METHOTREXATE 2.5 MG PO TABS
20.0000 mg | ORAL_TABLET | ORAL | Status: DC
  Administered 2012-05-27: 20 mg via ORAL
  Filled 2012-05-26: qty 8

## 2012-05-26 MED ORDER — METOCLOPRAMIDE HCL 10 MG PO TABS: 5.0000 mg | ORAL_TABLET | Freq: Three times a day (TID) | ORAL | Status: DC | PRN

## 2012-05-26 MED ORDER — OXYCODONE HCL 10 MG PO TB12
10.0000 mg | ORAL_TABLET | Freq: Two times a day (BID) | ORAL | Status: DC
  Administered 2012-05-26 – 2012-05-29 (×2): 10 mg via ORAL
  Filled 2012-05-26 (×4): qty 1

## 2012-05-26 MED ORDER — DROPERIDOL 2.5 MG/ML IJ SOLN
0.6250 mg | INTRAMUSCULAR | Status: DC | PRN
  Filled 2012-05-26: qty 0.25

## 2012-05-26 MED ORDER — BISACODYL 5 MG PO TBEC
5.0000 mg | DELAYED_RELEASE_TABLET | Freq: Every day | ORAL | Status: DC | PRN
  Administered 2012-05-28 – 2012-05-29 (×2): 5 mg via ORAL
  Filled 2012-05-26 (×2): qty 1

## 2012-05-26 MED ORDER — VITAMIN D 1000 UNITS PO CAPS: 1000.0000 [IU] | ORAL_CAPSULE | Freq: Every day | ORAL | Status: DC

## 2012-05-26 MED ORDER — CEFAZOLIN SODIUM 1-5 GM-% IV SOLN
1.0000 g | Freq: Three times a day (TID) | INTRAVENOUS | Status: AC
  Administered 2012-05-27: 1 g via INTRAVENOUS
  Filled 2012-05-26: qty 50

## 2012-05-26 MED ORDER — PROPOFOL 10 MG/ML IV BOLUS
INTRAVENOUS | Status: DC | PRN
  Administered 2012-05-26: 150 mg via INTRAVENOUS

## 2012-05-26 MED ORDER — CALCIUM CARBONATE 1250 (500 CA) MG PO TABS
1.0000 | ORAL_TABLET | Freq: Two times a day (BID) | ORAL | Status: DC
  Administered 2012-05-26 – 2012-05-30 (×6): 500 mg via ORAL
  Filled 2012-05-26 (×11): qty 1

## 2012-05-26 MED ORDER — DEXTROSE 5 % IV SOLN
1.0000 g | INTRAVENOUS | Status: DC
  Administered 2012-05-26: 1 g via INTRAVENOUS
  Filled 2012-05-26 (×2): qty 10

## 2012-05-26 MED ORDER — HYDROCODONE-ACETAMINOPHEN 5-325 MG PO TABS
1.0000 | ORAL_TABLET | Freq: Four times a day (QID) | ORAL | Status: DC | PRN
  Administered 2012-05-27 – 2012-05-30 (×7): 1 via ORAL
  Filled 2012-05-26 (×7): qty 1

## 2012-05-26 MED ORDER — ROCURONIUM BROMIDE 100 MG/10ML IV SOLN
INTRAVENOUS | Status: DC | PRN
  Administered 2012-05-26: 40 mg via INTRAVENOUS
  Administered 2012-05-26: 10 mg via INTRAVENOUS

## 2012-05-26 MED ORDER — FOLIC ACID 1 MG PO TABS
1.0000 mg | ORAL_TABLET | Freq: Two times a day (BID) | ORAL | Status: DC
  Administered 2012-05-26 – 2012-05-30 (×10): 1 mg via ORAL
  Filled 2012-05-26 (×12): qty 1

## 2012-05-26 MED ORDER — DEXTROSE 5 % IV SOLN
500.0000 mg | INTRAVENOUS | Status: DC
  Administered 2012-05-26: 500 mg via INTRAVENOUS
  Filled 2012-05-26 (×2): qty 500

## 2012-05-26 MED ORDER — ONDANSETRON HCL 4 MG PO TABS: 4.0000 mg | ORAL_TABLET | Freq: Four times a day (QID) | ORAL | Status: DC | PRN

## 2012-05-26 MED ORDER — LACTATED RINGERS IV SOLN
INTRAVENOUS | Status: DC
  Administered 2012-05-26 – 2012-05-28 (×2): via INTRAVENOUS

## 2012-05-26 MED ORDER — ENOXAPARIN SODIUM 40 MG/0.4ML ~~LOC~~ SOLN
40.0000 mg | SUBCUTANEOUS | Status: DC
Start: 2012-05-27 — End: 2012-05-30
  Administered 2012-05-27 – 2012-05-30 (×4): 40 mg via SUBCUTANEOUS
  Filled 2012-05-26 (×4): qty 0.4

## 2012-05-26 MED ORDER — DEXTROSE-NACL 5-0.9 % IV SOLN
INTRAVENOUS | Status: DC
  Administered 2012-05-26: 02:00:00 via INTRAVENOUS

## 2012-05-26 MED ORDER — NEOSTIGMINE METHYLSULFATE 1 MG/ML IJ SOLN
INTRAMUSCULAR | Status: DC | PRN
  Administered 2012-05-26: 3 mg via INTRAVENOUS

## 2012-05-26 MED ORDER — ACETAMINOPHEN 10 MG/ML IV SOLN
INTRAVENOUS | Status: DC | PRN
  Administered 2012-05-26: 1000 mg via INTRAVENOUS

## 2012-05-26 MED ORDER — DEXTROSE 5 % IV SOLN
500.0000 mg | Freq: Four times a day (QID) | INTRAVENOUS | Status: DC | PRN
  Filled 2012-05-26: qty 5

## 2012-05-26 MED ORDER — LACTATED RINGERS IV SOLN
INTRAVENOUS | Status: DC | PRN
  Administered 2012-05-26: 18:00:00 via INTRAVENOUS

## 2012-05-26 MED ORDER — ONDANSETRON HCL 4 MG/2ML IJ SOLN: 4.0000 mg | Freq: Four times a day (QID) | INTRAMUSCULAR | Status: DC | PRN

## 2012-05-26 MED ORDER — DARIFENACIN HYDROBROMIDE ER 7.5 MG PO TB24
7.5000 mg | ORAL_TABLET | Freq: Every day | ORAL | Status: DC
  Administered 2012-05-27 – 2012-05-30 (×4): 7.5 mg via ORAL
  Filled 2012-05-26 (×6): qty 1

## 2012-05-26 MED ORDER — FLEET ENEMA 7-19 GM/118ML RE ENEM: 1.0000 | ENEMA | Freq: Once | RECTAL | Status: AC | PRN

## 2012-05-26 MED ORDER — PHENYLEPHRINE HCL 10 MG/ML IJ SOLN
10.0000 mg | INTRAVENOUS | Status: DC | PRN
  Administered 2012-05-26: 40 ug/min via INTRAVENOUS

## 2012-05-26 MED ORDER — ACETAMINOPHEN 10 MG/ML IV SOLN
INTRAVENOUS | Status: AC
  Filled 2012-05-26: qty 100

## 2012-05-26 MED ORDER — MORPHINE SULFATE 2 MG/ML IJ SOLN: 0.5000 mg | INTRAMUSCULAR | Status: DC | PRN

## 2012-05-26 MED ORDER — OXYBUTYNIN CHLORIDE ER 10 MG PO TB24
10.0000 mg | ORAL_TABLET | Freq: Every day | ORAL | Status: DC
  Administered 2012-05-26 – 2012-05-30 (×5): 10 mg via ORAL
  Filled 2012-05-26 (×5): qty 1

## 2012-05-26 MED ORDER — HYDROMORPHONE HCL PF 1 MG/ML IJ SOLN: 0.2500 mg | INTRAMUSCULAR | Status: DC | PRN

## 2012-05-26 MED ORDER — HYDROMORPHONE HCL PF 1 MG/ML IJ SOLN
0.5000 mg | INTRAMUSCULAR | Status: AC | PRN
  Administered 2012-05-26: 0.5 mg via INTRAVENOUS
  Filled 2012-05-26: qty 1

## 2012-05-26 MED ORDER — GLYCOPYRROLATE 0.2 MG/ML IJ SOLN
INTRAMUSCULAR | Status: DC | PRN
  Administered 2012-05-26: .5 mg via INTRAVENOUS

## 2012-05-26 SURGICAL SUPPLY — 71 items
BLADE SAW SAG 73X25 THK (BLADE) ×1
BLADE SAW SGTL 73X25 THK (BLADE) ×1 IMPLANT
BLADE SURG ROTATE 9660 (MISCELLANEOUS) IMPLANT
BRUSH FEMORAL CANAL (MISCELLANEOUS) ×1 IMPLANT
CEMENT HV SMART SET (Cement) ×2 IMPLANT
CEMENT RESTRICTOR DEPUY SZ 4 (Cement) ×1 IMPLANT
CLOTH BEACON ORANGE TIMEOUT ST (SAFETY) ×2 IMPLANT
COVER SURGICAL LIGHT HANDLE (MISCELLANEOUS) ×2 IMPLANT
DRAPE INCISE IOBAN 66X45 STRL (DRAPES) IMPLANT
DRAPE ORTHO SPLIT 77X108 STRL (DRAPES) ×4
DRAPE PROXIMA HALF (DRAPES) ×2 IMPLANT
DRAPE SURG ORHT 6 SPLT 77X108 (DRAPES) ×2 IMPLANT
DRAPE U-SHAPE 47X51 STRL (DRAPES) ×2 IMPLANT
DRILL BIT 7/64X5 (BIT) ×2 IMPLANT
DRSG ADAPTIC 3X8 NADH LF (GAUZE/BANDAGES/DRESSINGS) ×2 IMPLANT
DRSG MEPILEX BORDER 4X8 (GAUZE/BANDAGES/DRESSINGS) ×1 IMPLANT
DRSG PAD ABDOMINAL 8X10 ST (GAUZE/BANDAGES/DRESSINGS) ×4 IMPLANT
DURAPREP 26ML APPLICATOR (WOUND CARE) ×2 IMPLANT
ELECT BLADE 6.5 EXT (BLADE) IMPLANT
ELECT REM PT RETURN 9FT ADLT (ELECTROSURGICAL) ×2
ELECTRODE REM PT RTRN 9FT ADLT (ELECTROSURGICAL) ×1 IMPLANT
EVACUATOR 1/8 PVC DRAIN (DRAIN) IMPLANT
FACESHIELD LNG OPTICON STERILE (SAFETY) ×1 IMPLANT
GLOVE BIO SURGEON STRL SZ 6.5 (GLOVE) ×1 IMPLANT
GLOVE BIO SURGEON STRL SZ7 (GLOVE) ×1 IMPLANT
GLOVE BIO SURGEON STRL SZ8.5 (GLOVE) ×2 IMPLANT
GLOVE BIOGEL PI IND STRL 6.5 (GLOVE) IMPLANT
GLOVE BIOGEL PI IND STRL 8 (GLOVE) ×1 IMPLANT
GLOVE BIOGEL PI IND STRL 8.5 (GLOVE) ×1 IMPLANT
GLOVE BIOGEL PI INDICATOR 6.5 (GLOVE) ×1
GLOVE BIOGEL PI INDICATOR 8 (GLOVE) ×1
GLOVE BIOGEL PI INDICATOR 8.5 (GLOVE) ×1
GLOVE SS BIOGEL STRL SZ 8 (GLOVE) ×1 IMPLANT
GLOVE SUPERSENSE BIOGEL SZ 8 (GLOVE) ×1
GOWN PREVENTION PLUS XLARGE (GOWN DISPOSABLE) ×6 IMPLANT
GOWN PREVENTION PLUS XXLARGE (GOWN DISPOSABLE) ×2 IMPLANT
GOWN STRL NON-REIN LRG LVL3 (GOWN DISPOSABLE) ×2 IMPLANT
HANDPIECE INTERPULSE COAX TIP (DISPOSABLE) ×2
HOOD PEEL AWAY FACE SHEILD DIS (HOOD) ×4 IMPLANT
IMMOBILIZER KNEE 20 (SOFTGOODS)
IMMOBILIZER KNEE 20 THIGH 36 (SOFTGOODS) IMPLANT
IMMOBILIZER KNEE 22 UNIV (SOFTGOODS) IMPLANT
IMMOBILIZER KNEE 24 THIGH 36 (MISCELLANEOUS) IMPLANT
IMMOBILIZER KNEE 24 UNIV (MISCELLANEOUS)
KIT BASIN OR (CUSTOM PROCEDURE TRAY) ×2 IMPLANT
KIT ROOM TURNOVER OR (KITS) ×2 IMPLANT
MANIFOLD NEPTUNE II (INSTRUMENTS) ×2 IMPLANT
NDL 1/2 CIR MAYO (NEEDLE) IMPLANT
NDL SUT 2 .5 CRC MAYO 1.732X (NEEDLE) IMPLANT
NEEDLE 1/2 CIR MAYO (NEEDLE) IMPLANT
NEEDLE MAYO TAPER (NEEDLE)
NS IRRIG 1000ML POUR BTL (IV SOLUTION) ×2 IMPLANT
PACK TOTAL JOINT (CUSTOM PROCEDURE TRAY) ×2 IMPLANT
PAD ARMBOARD 7.5X6 YLW CONV (MISCELLANEOUS) ×4 IMPLANT
PASSER SUT SWANSON 36MM LOOP (INSTRUMENTS) IMPLANT
SET HNDPC FAN SPRY TIP SCT (DISPOSABLE) IMPLANT
SPONGE GAUZE 4X4 12PLY (GAUZE/BANDAGES/DRESSINGS) ×2 IMPLANT
STAPLER VISISTAT 35W (STAPLE) ×2 IMPLANT
SUCTION FRAZIER TIP 10 FR DISP (SUCTIONS) ×2 IMPLANT
SUT ETHIBOND NAB CT1 #1 30IN (SUTURE) ×8 IMPLANT
SUT VIC AB 0 CT1 27 (SUTURE) ×2
SUT VIC AB 0 CT1 27XBRD ANBCTR (SUTURE) ×1 IMPLANT
SUT VIC AB 1 CTB1 27 (SUTURE) ×6 IMPLANT
SUT VIC AB 2-0 CT1 27 (SUTURE) ×2
SUT VIC AB 2-0 CT1 TAPERPNT 27 (SUTURE) ×1 IMPLANT
SYR BULB IRRIGATION 50ML (SYRINGE) ×1 IMPLANT
TOWEL OR 17X24 6PK STRL BLUE (TOWEL DISPOSABLE) ×2 IMPLANT
TOWEL OR 17X26 10 PK STRL BLUE (TOWEL DISPOSABLE) ×2 IMPLANT
TOWER CARTRIDGE SMART MIX (DISPOSABLE) ×1 IMPLANT
TRAY FOLEY CATH 14FR (SET/KITS/TRAYS/PACK) IMPLANT
WATER STERILE IRR 1000ML POUR (IV SOLUTION) ×8 IMPLANT

## 2012-05-26 NOTE — Progress Notes (Signed)
[facility] PROGRESS NOTE  Caroline Lewis HYQ:[id number] DOB: September 23, 1924 DOA: 05/25/2012 PCP: Caroline Patch, MD  Assessment/Plan: Hip fracture, right  -No obvious history of trauma, has history of severe osteoporosis. -s/p right hip hemiarthoplasty.  -pain control - PT/OT evaluaiton.   Marland KitchenPNA (pneumonia)  -Patient is afebrile, does not give a history of cough, however and x-ray findings we'll empirically start the patient on Rocephin and Zithromax.   .Rheumatoid arthritis  -Continue with methotrexate   .UTI (lower urinary tract infection)  -Will be on Rocephin, get urine culture   .Osteoporosis  -Need further optimization of her medications as an outpatient  Further plan will depend as patient's clinical course evolves and further radiologic and laboratory data become available. Patient will be monitored closely.   DVT Prophylaxis:  -Lovenox  Code Status:  -Full code   Principal Problem:  *Hip fracture, right Active Problems:  Rheumatoid arthritis  PNA (pneumonia)  UTI (lower urinary tract infection)  Osteoporosis   Family Communication: none at bedside Disposition Plan: pending PT/OT evaluation  Caroline Beltran, MD  [facility] Pager [phone]  If 7PM-7AM, please contact night-coverage [email or web address] Password [email or web address] 05/26/2012, 6:18 PM   LOS: 1 day   Brief narrative: Patient is 78 year old Caucasian female with a past medical history of rheumatoid arthritis, severe osteoporosis who was brought to the hospital today for evaluation ofRight hip pain for the past 3 weeks, in the ED she was evaluated, she had a x-ray of the hip done which showed a right hip fracture, ortho consult called and she went for right hip hemiarthroplasty.    Consultants:  Orthopedic consult.  Procedures: Right hip hemiarthroplasty     HPI/Subjective: NO NEW COMPLAINTS.   Objective: Filed Vitals:   05/26/12 0049 05/26/12 0552 05/26/12 1400 05/26/12 1645  BP:  140/71 117/68 134/67 130/55  Pulse: 99 94 84 74  Temp: 99.6 F (37.6 C) 99.2 F (37.3 C) 98.3 F (36.8 C) 98.6 F (37 C)  TempSrc: Oral Oral    Resp: 18 20 20 16   SpO2: 99% 97% 100%     Intake/Output Summary (Last 24 hours) at 05/26/12 1818 Last data filed at 05/26/12 1530  Gross per 24 hour  Intake      0 ml  Output    500 ml  Net   -500 ml    Exam:   General:  Afebrile comfortable  Cardiovascular: s1s2 heard  Respiratory: CTAB  Abdomen: soft NT ND BS+  Data Reviewed: Basic Metabolic Panel:  Lab 05/26/12 2440 05/25/12 2202  NA 137 137  K 4.2 4.2  CL 100 101  CO2 28 27  GLUCOSE 128* 158*  BUN 21 22  CREATININE 0.58 0.57  CALCIUM 9.0 8.7  MG -- --  PHOS -- --   Liver Function Tests:  Lab 05/26/12 0037 05/25/12 2202  AST 20 17  ALT 10 8  ALKPHOS 93 84  BILITOT 0.4 0.3  PROT 6.5 6.0  ALBUMIN 2.9* 2.7*   No results found for this basename: LIPASE:5,AMYLASE:5 in the last 168 hours No results found for this basename: AMMONIA:5 in the last 168 hours CBC:  Lab 05/25/12 1821  WBC 11.5*  NEUTROABS 9.2*  HGB 14.1  HCT 41.2  MCV 97.9  PLT 240   Cardiac Enzymes: No results found for this basename: CKTOTAL:5,CKMB:5,CKMBINDEX:5,TROPONINI:5 in the last 168 hours BNP (last 3 results) No results found for this basename: PROBNP:3 in the last 8760 hours CBG: No results found for this basename: GLUCAP:5 in the last 168  hours  Recent Results (from the past 240 hour(s))  SURGICAL PCR SCREEN     Status: Normal   Collection Time   05/26/12  2:07 AM      Component Value Range Status Comment   MRSA, PCR NEGATIVE  NEGATIVE Final    Staphylococcus aureus NEGATIVE  NEGATIVE Final   SURGICAL PCR SCREEN     Status: Normal   Collection Time   05/26/12  4:44 PM      Component Value Range Status Comment   MRSA, PCR NEGATIVE  NEGATIVE Final    Staphylococcus aureus NEGATIVE  NEGATIVE Final      Studies: Dg Chest 1 View  05/25/2012  *RADIOLOGY REPORT*  Clinical Data:  Fever and groin pain  CHEST - 1 VIEW  Comparison: None  Findings: The heart size is moderately enlarged.  Small left pleural effusion is suspected.  Opacity within the periphery of the left base is noted, suspicious for pneumonia.  Coarsened interstitial markings are identified.  There is a scoliosis deformity affecting the thoracic and lumbar spine.  IMPRESSION:  1.  Left base opacity which may represent pneumonia. 2.  Suspect left pleural effusion.  Original Report Authenticated By: Caroline Lewis, M.D.   Dg Lumbar Spine Complete  05/25/2012  *RADIOLOGY REPORT*  Clinical Data: Back pain.  LUMBAR SPINE - COMPLETE 4+ VIEW  Comparison: Lumbar spine MRI 09/05/2009 and CT scan 09/08/2009.  Findings: There is a severe right convex lumbar scoliosis with associated severe degenerative disc disease and facet disease. There is a remote fracture of T11.  No definite acute lumbar compression fracture.  IMPRESSION: Scoliosis and associated severe degenerative lumbar spondylosis with disc disease and facet disease.  No definite acute fracture.  Original Report Authenticated By: Caroline Lewis, M.D.   Dg Hip Complete Right  05/25/2012  *RADIOLOGY REPORT*  Clinical Data: Right hip pain  RIGHT HIP - COMPLETE 2+ VIEW  Comparison: None  Findings: There is a subcapital fracture deformity involving the right femoral neck.  There is a significant proximal displacement of the femur with respect to the femoral head.  IMPRESSION:  1.  Subcapital femoral neck fracture with significant proximal displacement of the femur.  Original Report Authenticated By: Caroline Lewis, M.D.    Scheduled Meds:   . azithromycin  500 mg Intravenous Once  . azithromycin  500 mg Intravenous Q24H  . calcium carbonate  1 tablet Oral BID WC  . cefTRIAXone (ROCEPHIN)  IV  1 g Intravenous Once  . cefTRIAXone (ROCEPHIN)  IV  1 g Intravenous Q24H  . cholecalciferol  1,000 Units Oral Daily  . darifenacin  7.5 mg Oral Daily  . enoxaparin  40 mg  Subcutaneous Q24H  . folic acid  1 mg Oral BID  .  HYDROmorphone (DILAUDID) injection  0.5 mg Intravenous Once  .  HYDROmorphone (DILAUDID) injection  0.5 mg Intravenous Once  .  HYDROmorphone (DILAUDID) injection  0.5 mg Intravenous Once  . methotrexate  20 mg Oral Q Wed  . oxybutynin  10 mg Oral Daily  . oxyCODONE  10 mg Oral Q12H  . senna  1 tablet Oral BID  . DISCONTD: calcium carbonate  600 mg Oral BID WC  . DISCONTD:  HYDROmorphone (DILAUDID) injection  0.5 mg Intravenous Once  . DISCONTD: Vitamin D  1,000 Units Oral Daily   Continuous Infusions:   . dextrose 5 % and 0.9% NaCl 75 mL/hr at 05/26/12 0157  . lactated ringers 50 mL/hr at 05/26/12 1731  .  DISCONTD: sodium chloride 125 mL/hr at 05/25/12 2059

## 2012-05-26 NOTE — Transfer of Care (Signed)
Immediate Anesthesia Transfer of Care Note  Patient: Caroline Lewis  Procedure(s) Performed: Procedure(s) (LRB): ARTHROPLASTY BIPOLAR HIP (Right)  Patient Location: PACU  Anesthesia Type: General  Level of Consciousness: oriented, patient cooperative and responds to stimulation  Airway & Oxygen Therapy: Patient Spontanous Breathing and Patient connected to nasal cannula oxygen  Post-op Assessment: Report given to PACU RN and Post -op Vital signs reviewed and stable  Post vital signs: Reviewed and stable  Complications: No apparent anesthesia complications

## 2012-05-26 NOTE — Anesthesia Preprocedure Evaluation (Addendum)
Anesthesia Evaluation  Patient identified by MRN, date of birth, ID band Patient awake    Reviewed: Allergy & Precautions, H&P , NPO status , Patient's Chart, lab work & pertinent test results  History of Anesthesia Complications Negative for: history of anesthetic complications  Airway Mallampati: I TM Distance: >3 FB Neck ROM: Full    Dental  (+) Teeth Intact and Dental Advisory Given   Pulmonary pneumonia ,  breath sounds clear to auscultation  Pulmonary exam normal       Cardiovascular negative cardio ROS  Rhythm:Regular Rate:Normal     Neuro/Psych negative neurological ROS  negative psych ROS   GI/Hepatic negative GI ROS, Neg liver ROS,   Endo/Other  negative endocrine ROS  Renal/GU negative Renal ROS     Musculoskeletal  (+) Arthritis -, Osteoarthritis and Rheumatoid disorders,    Abdominal   Peds  Hematology   Anesthesia Other Findings   Reproductive/Obstetrics                          Anesthesia Physical Anesthesia Plan  ASA: III  Anesthesia Plan: General   Post-op Pain Management:    Induction: Intravenous  Airway Management Planned: Oral ETT  Additional Equipment:   Intra-op Plan:   Post-operative Plan: Extubation in OR  Informed Consent: I have reviewed the patients History and Physical, chart, labs and discussed the procedure including the risks, benefits and alternatives for the proposed anesthesia with the patient or authorized representative who has indicated his/her understanding and acceptance.   Dental advisory given  Plan Discussed with: CRNA, Anesthesiologist and Surgeon  Anesthesia Plan Comments:         Anesthesia Quick Evaluation

## 2012-05-26 NOTE — Brief Op Note (Signed)
CICLEY GANESH [id number] 05/26/2012   PRE-OP DIAGNOSIS: right femoral neck fracture  POST-OP DIAGNOSIS: same  PROCEDURE: right hip hemiarthroplasty  ANESTHESIA: general  Velna Ochs   Dictation #:  [id number]   May WBAT with PT, antibiotics for one dose more, resume Lovenox for DVT prophylaxis in 12 hours

## 2012-05-26 NOTE — Progress Notes (Signed)
CARE MANAGEMENT NOTE 05/26/2012  Patient:  Caroline Lewis, Caroline Lewis   Account Number:  [id number]  Date Initiated:  05/26/2012  Documentation initiated by:  Vance Peper  Subjective/Objective Assessment:   78 yr old female adm for closed subcapital fracture of right femur     Action/Plan:   patient to go to OR on 05/27/12, CM will follow   Anticipated DC Date:     Anticipated DC Plan:           Choice offered to / List presented to:             Status of service:  In process, will continue to follow

## 2012-05-26 NOTE — Interval H&P Note (Signed)
History and Physical Interval Note:  05/26/2012 3:42 PM  Caroline Lewis  has presented today for surgery, with the diagnosis of Right Hip Fracture  The various methods of treatment have been discussed with the patient and family. After consideration of risks, benefits and other options for treatment, the patient has consented to  Procedure(s) (LRB): ARTHROPLASTY BIPOLAR HIP (Right) as a surgical intervention .  The patient's history has been reviewed, patient examined, no change in status, stable for surgery.  I have reviewed the patients' chart and labs.  Questions were answered to the patient's satisfaction.     Kassie Keng G

## 2012-05-26 NOTE — Anesthesia Postprocedure Evaluation (Signed)
Anesthesia Post Note  Patient: Caroline Lewis  Procedure(s) Performed: Procedure(s) (LRB): ARTHROPLASTY BIPOLAR HIP (Right)  Anesthesia type: General  Patient location: PACU  Post pain: Pain level controlled and Adequate analgesia  Post assessment: Post-op Vital signs reviewed, Patient's Cardiovascular Status Stable, Respiratory Function Stable, Patent Airway and Pain level controlled  Last Vitals:  Filed Vitals:   05/26/12 2045  BP: 139/82  Pulse: 91  Temp:   Resp: 14    Post vital signs: Reviewed and stable  Level of consciousness: awake, alert  and oriented  Complications: No apparent anesthesia complications

## 2012-05-26 NOTE — Anesthesia Procedure Notes (Addendum)
Procedure Name: Intubation Date/Time: 05/26/2012 6:33 PM Performed by: Molli Hazard Pre-anesthesia Checklist: Patient identified, Timeout performed, Emergency Drugs available, Suction available and Patient being monitored Patient Re-evaluated:Patient Re-evaluated prior to inductionOxygen Delivery Method: Circle system utilized Preoxygenation: Pre-oxygenation with 100% oxygen Intubation Type: IV induction Ventilation: Mask ventilation without difficulty Laryngoscope Size: Mac and 3 Grade View: Grade I Tube type: Oral Tube size: 7.5 mm Number of attempts: 1 Airway Equipment and Method: Stylet Placement Confirmation: ETT inserted through vocal cords under direct vision,  breath sounds checked- equal and bilateral and positive ETCO2 Secured at: 20 cm Tube secured with: Tape Dental Injury: Teeth and Oropharynx as per pre-operative assessment

## 2012-05-26 NOTE — Progress Notes (Signed)
UR completed. Vance Peper, RN BSN

## 2012-05-27 LAB — BASIC METABOLIC PANEL
CO2: 23 mEq/L (ref 19–32)
Chloride: 101 mEq/L (ref 96–112)
GFR calc Af Amer: >90 mL/min (ref >90)
Potassium: 4.2 mEq/L (ref 3.5–5.1)
Sodium: 136 mEq/L (ref 135–145)

## 2012-05-27 LAB — CBC
HCT: 37.3 % (ref 36.0–46.0)
Hemoglobin: 11.9 g/dL — ABNORMAL LOW (ref 12.0–15.0)
MCV: 101.9 fL — ABNORMAL HIGH (ref 78.0–100.0)
RBC: 3.66 MIL/uL — ABNORMAL LOW (ref 3.87–5.11)
RDW: 14.7 % (ref 11.5–15.5)
WBC: 10.2 10*3/uL (ref 4.0–10.5)

## 2012-05-27 MED ORDER — LEVOFLOXACIN IN D5W 750 MG/150ML IV SOLN
750.0000 mg | INTRAVENOUS | Status: DC
  Filled 2012-05-27: qty 150

## 2012-05-27 MED ORDER — LEVOFLOXACIN IN D5W 750 MG/150ML IV SOLN
750.0000 mg | INTRAVENOUS | Status: DC
  Administered 2012-05-27: 750 mg via INTRAVENOUS
  Filled 2012-05-27 (×2): qty 150

## 2012-05-27 MED ORDER — ACETAMINOPHEN 325 MG PO TABS: 650.0000 mg | ORAL_TABLET | Freq: Four times a day (QID) | ORAL | Status: DC | PRN

## 2012-05-27 MED ORDER — VANCOMYCIN HCL 1000 MG IV SOLR
750.0000 mg | INTRAVENOUS | Status: DC
  Administered 2012-05-27 – 2012-05-28 (×2): 750 mg via INTRAVENOUS
  Filled 2012-05-27 (×3): qty 750

## 2012-05-27 NOTE — Op Note (Signed)
NAMELINSI, HUMANN NO.:  [id number]  MEDICAL RECORD NO.:  [id number]  LOCATION:  [id number]                        FACILITY:  [facility]  PHYSICIAN:  Lubertha Basque. Icel Castles, M.D.DATE OF BIRTH:  05-Aug-1924  DATE OF PROCEDURE:  05/26/2012 DATE OF DISCHARGE:                              OPERATIVE REPORT   PREOPERATIVE DIAGNOSIS:  Right femoral neck fracture.  POSTOPERATIVE DIAGNOSIS:  Right femoral neck fracture.  PROCEDURE:  Right hip hemiarthroplasty.  ANESTHESIA:  General.  ATTENDING SURGEON:  Lubertha Basque. Jerl Santos, MD.  ASSISTANTLindwood Qua, PA.  INDICATION FOR PROCEDURE:  The patient is an 78 year old relatively healthy woman who has had several weeks of right hip pain.  This has gotten worse recently and she is no longer able to sit comfortably or rest.  She was seen in the emergency room yesterday and noted to have a completely displaced femoral neck fracture and she was offered hemiarthroplasty in hopes of her again sitting and potentially getting out of bed.  An informed operative consent was obtained after discussion of possible complications including reaction to anesthesia, infection, DVT, dislocation, and death.  SUMMARY OF FINDINGS AND PROCEDURE:  Under general anesthesia, through a posterior approach, a right hip hemiarthroplasty was performed.  She had a completely displaced fracture and fairly poor bone quality.  There were no degenerative changes in the acetabulum.  We dressed her problem with a cemented DePuy system using a size 6 Summit stem in slight anteversion, capped with a -3, 48 unipolar hip ball of metal.  I did use a Cementralizer distally.  Lindwood Qua assisted throughout and was invaluable to the completion of the case in that he helped to maintain exposure and close simultaneously to help minimize OR time.  DESCRIPTION OF PROCEDURE:  The patient was taken to the operating suite where general anesthetic was applied without  difficulty.  She was positioned in the lateral decubitus position with the right hip up.  All bony prominences were padded, hip positioners were utilized, and an axillary roll was placed.  She was then prepped and draped in normal sterile fashion.  After the administration of IV Kefzol and an appropriate time-out, a posterior approach was taken to the right hip. A small incision was made with dissection down through a paucity of adipose tissue to the IT band.  The IT band and gluteus maximus fascia were incised longitudinally and split to expose the short external rotators of the hip, which were tagged and reflected.  This exposed a completely displaced fracture, which was through the capsule.  I made a revision femoral neck cut with a saw.  The acetabulum was examined and was found to be free of significant degenerative change.  This sized to a size 48 ball.  We then broached the femur appropriately and she had a very large canal and I did not feel good about an uncemented prosthesis, so we elected to cement.  A distal cement plug was placed, which was size 4, and the canal was thoroughly irrigated.  Cement was mixed and was pressurized, followed by placement of the aforementioned femoral component in slight anteversion.  The cement hardened and excess was trimmed.  We then placed several  trial hip balls and the -3 seemed to fit the best.  This gave [place] good stability in extension with external rotation as well as flexion with internal rotation.  Her leg lengths were felt to be about equal.  The wound was thoroughly irrigated after final reduction of the construct.  I then repaired the short external rotators and capsular structures to the greater trochanter with nonabsorbable suture.  IT band was reapproximated with #1 Vicryl in an interrupted fashion as was the gluteus maximus fascia.  Subcutaneous tissues were reapproximated with 0 and 2-0 undyed Vicryl followed by skin closure with  staples.  A Mepilex dressing was then applied. Estimated blood loss and intraoperative fluids can be obtained from anesthesia records.  DISPOSITION:  The patient was extubated in operating room and taken to recovery room in stable addition.  She was to be admitted back to the Medicine service.  She will resume her Lovenox for DVT prophylaxis and will receive a short course of perioperative antibiotic.  She may weight bear as tolerated.     Lubertha Basque Jerl Santos, M.D.     PGD/MEDQ  D:  05/26/2012  T:  05/27/2012  Job:  [id number]

## 2012-05-27 NOTE — Progress Notes (Signed)
Caroline Lewis is a 78 y.o. female admitted with 21/2 week hx of right hip pain with no apparent trauma, except an incident of straining involving the lower extremities, per patient's daughter. I have reviewed Ms Seitzinger's chart, seen and examined her at bed side in the presence of her daughter. Ms Gasca is rather drowsy. Her daughter says that she has had poor appetite and lethargy since some time last weak, and she attributes this to Vicodin, which was started around Thursday last week. She is also being treated for LLL PNA. Currently, patient denies any complaints, but her daughter says that she has been "hot" for most of the day. Appreciate ortho input.  1. Closed subcapital fracture of femur   2. CAP (community acquired pneumonia)   3. Hip fracture, right   4. Rheumatoid arthritis   5. UTI (lower urinary tract infection)   6. Frequency of urination     Past Medical History  Diagnosis Date  . Rheumatoid arthritis   . IBS (irritable bowel syndrome)   . Constipation   . Allergy     seasonal  . Cataract     surgery bilateral  . Osteoporosis   . Sciatica    Current Facility-Administered Medications  Medication Dose Route Frequency Provider Last Rate Last Dose  . acetaminophen (TYLENOL) tablet 650 mg  650 mg Oral Q6H PRN Katlynne Mckercher, MD      . bisacodyl (DULCOLAX) EC tablet 5 mg  5 mg Oral Daily PRN Shanker Levora Dredge, MD      . calcium carbonate (OS-CAL - dosed in mg of elemental calcium) tablet 500 mg of elemental calcium  1 tablet Oral BID WC Kathlen Mody, MD   500 mg of elemental calcium at 05/27/12 1003  . ceFAZolin (ANCEF) IVPB 1 g/50 mL premix  1 g Intravenous Q8H Prince Rome, PA   1 g at 05/27/12 0325  . cholecalciferol (VITAMIN D) tablet 1,000 Units  1,000 Units Oral Daily Kathlen Mody, MD   1,000 Units at 05/27/12 1003  . darifenacin (ENABLEX) 24 hr tablet 7.5 mg  7.5 mg Oral Daily Maretta Bees, MD   7.5 mg at 05/27/12 1004  . enoxaparin (LOVENOX) injection  40 mg  40 mg Subcutaneous Q24H Maretta Bees, MD   40 mg at 05/27/12 1004  . folic acid (FOLVITE) tablet 1 mg  1 mg Oral BID Maretta Bees, MD   1 mg at 05/27/12 1004  . HYDROcodone-acetaminophen (NORCO) 5-325 MG per tablet 1-2 tablet  1-2 tablet Oral Q6H PRN Maretta Bees, MD   1 tablet at 05/27/12 1351  . HYDROmorphone (DILAUDID) injection 0.25-0.5 mg  0.25-0.5 mg Intravenous Q5 min PRN Remonia Richter, MD      . lactated ringers infusion   Intravenous Continuous Remonia Richter, MD 50 mL/hr at 05/26/12 1731    . methocarbamol (ROBAXIN) tablet 500 mg  500 mg Oral Q6H PRN Maretta Bees, MD   500 mg at 05/26/12 0554   Or  . methocarbamol (ROBAXIN) 500 mg in dextrose 5 % 50 mL IVPB  500 mg Intravenous Q6H PRN Maretta Bees, MD      . methotrexate (RHEUMATREX) tablet 20 mg  20 mg Oral Q Wed Shanker Levora Dredge, MD   20 mg at 05/27/12 1004  . metoCLOPramide (REGLAN) tablet 5-10 mg  5-10 mg Oral Q8H PRN Prince Rome, PA       Or  . metoCLOPramide (REGLAN) injection 5-10 mg  5-10 mg Intravenous Q8H PRN Prince Rome, PA      . morphine 2 MG/ML injection 0.5 mg  0.5 mg Intravenous Q2H PRN Maretta Bees, MD      . ondansetron [facility]) tablet 4 mg  4 mg Oral Q6H PRN Prince Rome, PA       Or  . ondansetron [facility]) injection 4 mg  4 mg Intravenous Q6H PRN Prince Rome, PA      . oxybutynin (DITROPAN-XL) 24 hr tablet 10 mg  10 mg Oral Daily Maretta Bees, MD   10 mg at 05/27/12 1004  . oxyCODONE (OXYCONTIN) 12 hr tablet 10 mg  10 mg Oral Q12H Maretta Bees, MD   10 mg at 05/26/12 2251  . senna (SENOKOT) tablet 8.6 mg  1 tablet Oral BID Maretta Bees, MD   8.6 mg at 05/27/12 1003  . sodium phosphate (FLEET) 7-19 GM/118ML enema 1 enema  1 enema Rectal Once PRN Shanker Levora Dredge, MD      . DISCONTD: azithromycin (ZITHROMAX) 500 mg in dextrose 5 % 250 mL IVPB  500 mg Intravenous Q24H Maretta Bees, MD   500 mg at 05/26/12 2327  . DISCONTD:  cefTRIAXone (ROCEPHIN) 1 g in dextrose 5 % 50 mL IVPB  1 g Intravenous Q24H Maretta Bees, MD   1 g at 05/26/12 2205  . DISCONTD: dextrose 5 %-0.9 % sodium chloride infusion   Intravenous Continuous Maretta Bees, MD 75 mL/hr at 05/26/12 0157    . DISCONTD: droperidol (INAPSINE) injection 0.625 mg  0.625 mg Intravenous PRN Remonia Richter, MD       Facility-Administered Medications Ordered in Other Encounters  Medication Dose Route Frequency Provider Last Rate Last Dose  . DISCONTD: acetaminophen (OFIRMEV) IV    PRN Hessie Dibble, CRNA   1,000 mg at 05/26/12 1917  . DISCONTD: ceFAZolin (ANCEF) IVPB 1 g/50 mL premix    PRN Atilano Ina, CRNA   2 g at 05/26/12 1837  . DISCONTD: fentaNYL (SUBLIMAZE) injection    PRN Atilano Ina, CRNA   50 mcg at 05/26/12 1950  . DISCONTD: glycopyrrolate (ROBINUL) injection    PRN Hessie Dibble, CRNA   0.5 mg at 05/26/12 2006  . DISCONTD: lactated ringers infusion    Continuous PRN Hessie Dibble, CRNA      . DISCONTD: lidocaine (cardiac) 100 mg/20ml (XYLOCAINE) 20 MG/ML injection 2%    PRN Atilano Ina, CRNA   80 mg at 05/26/12 1833  . DISCONTD: neostigmine (PROSTIGMINE) injection   Intravenous PRN Hessie Dibble, CRNA   3 mg at 05/26/12 2006  . DISCONTD: ondansetron (ZOFRAN) injection    PRN Hessie Dibble, CRNA   4 mg at 05/26/12 1953  . DISCONTD: phenylephrine (NEO-SYNEPHRINE) 0.04 mg/mL in dextrose 5 % 250 mL infusion  10 mg  Continuous PRN Atilano Ina, CRNA   25 mcg/min at 05/26/12 2001  . DISCONTD: propofol (DIPRIVAN) 10 mg/mL bolus/IV push    PRN Atilano Ina, CRNA   150 mg at 05/26/12 1833  . DISCONTD: rocuronium (ZEMURON) injection    PRN Atilano Ina, CRNA   10 mg at 05/26/12 1915   No Known Allergies Principal Problem:  *Hip fracture, right Active Problems:  Rheumatoid arthritis  PNA (pneumonia)  UTI (lower urinary tract infection)  Osteoporosis   Vital signs in last 24 hours: Temp:   [97.5 F (36.4 C)-100 F (37.8 C)] 100  F (37.8 C) (07/03 1300) Pulse Rate:  [67-110] 110  (07/03 1300) Resp:  [14-19] 18  (07/03 1300) BP: (111-154)/(54-82) 111/59 mmHg (07/03 1300) SpO2:  [93 %-100 %] 93 % (07/03 1300) FiO2 (%):  [2 %] 2 % (07/03 0622) Weight change:  Last BM Date: 05/23/12  Intake/Output from previous day: 07/02 0701 - 07/03 0700 In: 900 [I.V.:900] Out: 460 [Urine:460] Intake/Output this shift: Total I/O In: 120 [P.O.:120] Out: -   Lab Results:  Basename 05/27/12 0500 05/25/12 1821  WBC 10.2 11.5*  HGB 11.9* 14.1  HCT 37.3 41.2  PLT 175 240   BMET  Basename 05/27/12 0500 05/26/12 0037  NA 136 137  K 4.2 4.2  CL 101 100  CO2 23 28  GLUCOSE 104* 128*  BUN 14 21  CREATININE 0.55 0.58  CALCIUM 8.9 9.0    Studies/Results: Dg Chest 1 View  05/25/2012  *RADIOLOGY REPORT*  Clinical Data: Fever and groin pain  CHEST - 1 VIEW  Comparison: None  Findings: The heart size is moderately enlarged.  Small left pleural effusion is suspected.  Opacity within the periphery of the left base is noted, suspicious for pneumonia.  Coarsened interstitial markings are identified.  There is a scoliosis deformity affecting the thoracic and lumbar spine.  IMPRESSION:  1.  Left base opacity which may represent pneumonia. 2.  Suspect left pleural effusion.  Original Report Authenticated By: Rosealee Albee, M.D.   Dg Lumbar Spine Complete  05/25/2012  *RADIOLOGY REPORT*  Clinical Data: Back pain.  LUMBAR SPINE - COMPLETE 4+ VIEW  Comparison: Lumbar spine MRI 09/05/2009 and CT scan 09/08/2009.  Findings: There is a severe right convex lumbar scoliosis with associated severe degenerative disc disease and facet disease. There is a remote fracture of T11.  No definite acute lumbar compression fracture.  IMPRESSION: Scoliosis and associated severe degenerative lumbar spondylosis with disc disease and facet disease.  No definite acute fracture.  Original Report Authenticated By: P. Loralie Champagne, M.D.   Dg Hip Complete Right  05/25/2012  *RADIOLOGY REPORT*  Clinical Data: Right hip pain  RIGHT HIP - COMPLETE 2+ VIEW  Comparison: None  Findings: There is a subcapital fracture deformity involving the right femoral neck.  There is a significant proximal displacement of the femur with respect to the femoral head.  IMPRESSION:  1.  Subcapital femoral neck fracture with significant proximal displacement of the femur.  Original Report Authenticated By: Rosealee Albee, M.D.    Medications: I have reviewed the patient's current medications.   Physical exam GENERAL- alert, hot to touch. HEAD- normal atraumatic, no neck masses, normal thyroid, no jvd RESPIRATORY- appears well, vitals normal, no respiratory distress, acyanotic, normal RR, ear and throat exam is normal, neck free of mass or lymphadenopathy, chest clear, no wheezing, crepitations, rhonchi, normal symmetric air entry CVS- regular rate and rhythm, S1, S2 normal, no murmur, click, rub or gallop ABDOMEN- abdomen is soft without significant tenderness, masses, organomegaly or guarding NEURO- Grossly normal EXTREMITIES- extremities normal, atraumatic, no cyanosis or edema  Plan   * Hip fracture, right- Traumatic versus pathological. Appreciate ortho. S/p fixation. For str when bed available, hopefully Friday.  * PNA (pneumonia)/ UTI (lower urinary tract infection)- apparent fever concerning. Patient would prefer not to have rectal temperature checked. Will obtain blood cultures. Change abx to vanc/levaquin till culture back. Add tylenol.  * Osteoporosis/Rheumatoid arthritis- continue home meds.  * dvt prophylaxis.     Verenice Westrich 05/27/2012 4:06 PM Pager: [phone].

## 2012-05-27 NOTE — Progress Notes (Signed)
Subjective: 1 Day Post-Op Procedure(s) (LRB): ARTHROPLASTY BIPOLAR HIP (Right)  Activity level:  In bed Diet tolerance:  light Voiding:  foley Patient reports pain as 3 on 0-10 scale.    Objective: Vital signs in last 24 hours: Temp:  [97.5 F (36.4 C)-100 F (37.8 C)] 100 F (37.8 C) (07/03 0622) Pulse Rate:  [67-104] 104  (07/03 0622) Resp:  [14-20] 16  (07/03 0622) BP: (111-154)/(54-82) 117/60 mmHg (07/03 0622) SpO2:  [93 %-100 %] 98 % (07/03 0622) FiO2 (%):  [2 %] 2 % (07/03 0622)  Labs:  Basename 05/27/12 0500 05/25/12 1821  HGB 11.9* 14.1    Basename 05/27/12 0500 05/25/12 1821  WBC 10.2 11.5*  RBC 3.66* 4.21  HCT 37.3 41.2  PLT 175 240    Basename 05/27/12 0500 05/26/12 0037  NA 136 137  K 4.2 4.2  CL 101 100  CO2 23 28  BUN 14 21  CREATININE 0.55 0.58  GLUCOSE 104* 128*  CALCIUM 8.9 9.0    Basename 05/26/12 0037 05/25/12 2202  LABPT -- --  INR 1.18 1.18    Physical Exam:  Neurologically intact ABD soft Neurovascular intact Sensation intact distally Intact pulses distally Dorsiflexion/Plantar flexion intact No cellulitis present Compartment soft Dressing dry  Assessment/Plan:  1 Day Post-Op Procedure(s) (LRB): ARTHROPLASTY BIPOLAR HIP (Right) Advance diet Up with therapy WBAT r leg Lovenox in hosp. Then ASA 325 bid for 2 weeks on discharge. RTO in 2 weeks for staple removal Will change dressing in a few days    Aide Wojnar R 05/27/2012, 8:14 AM

## 2012-05-27 NOTE — Evaluation (Signed)
Physical Therapy Evaluation Patient Details Name: Caroline Lewis MRN: [id number] DOB: 27-Jan-1924 Today's Date: 05/27/2012 Time: 4098-1191 PT Time Calculation (min): 28 min  PT Assessment / Plan / Recommendation Clinical Impression  Pt s/p R THA presenting with increased lethargy and R Hip pain both extremely limiting patients mobility and ability to participate in PT this date. patient to benefit from SNF placement upon d/c to maximize functional recovery for safet transition home.    PT Assessment  Patient needs continued PT services    Follow Up Recommendations  Skilled nursing facility    Barriers to Discharge        Equipment Recommendations  Defer to next venue    Recommendations for Other Services     Frequency Min 5X/week    Precautions / Restrictions Precautions Precautions: Posterior Hip Precaution Booklet Issued: No Precaution Comments: no provided due to lethargy Required Braces or Orthoses: Knee Immobilizer - Right Knee Immobilizer - Right:  (when in bed to assist with adhereing to hip prec) Restrictions Weight Bearing Restrictions: Yes RLE Weight Bearing: Weight bearing as tolerated   Pertinent Vitals/Pain Pt unable to rate but grimaces and groans in pain with R LE mvmt.      Mobility  Bed Mobility Bed Mobility: Supine to Sit;Sit to Supine Supine to Sit: 1: +2 Total assist;With rails;HOB elevated Supine to Sit: Patient Percentage: 30% Sit to Supine: 1: +2 Total assist;HOB flat Sit to Supine: Patient Percentage: 10% Details for Bed Mobility Assistance: pt able to pull trunk up with rail however dependent for pivoting to EOB and moving bilat LEs off EOB Transfers Transfers: Sit to Stand;Stand to Sit Sit to Stand: 1: +2 Total assist;From elevated surface;From bed Sit to Stand: Patient Percentage: 10% Stand to Sit: 1: +2 Total assist;To elevated surface;To bed Stand to Sit: Patient Percentage: 10% Details for Transfer Assistance: pt resistive due to  pain, pt unable to complete upright stand/posture Ambulation/Gait Ambulation/Gait Assistance: Not tested (comment)    Exercises     PT Diagnosis: Difficulty walking;Abnormality of gait;Generalized weakness;Acute pain  PT Problem List: Decreased strength;Decreased range of motion;Decreased activity tolerance;Decreased balance;Decreased mobility PT Treatment Interventions: DME instruction;Gait training;Functional mobility training;Therapeutic activities;Therapeutic exercise   PT Goals Acute Rehab PT Goals PT Goal Formulation: With family Time For Goal Achievement: 06/10/12 Potential to Achieve Goals: Good Pt will go Supine/Side to Sit: with min assist;with HOB 0 degrees;with rail PT Goal: Supine/Side to Sit - Progress: Goal set today Pt will go Sit to Stand: with min assist;with upper extremity assist (up to RW) PT Goal: Sit to Stand - Progress: Goal set today Pt will Transfer Bed to Chair/Chair to Bed: with supervision (with RW.) PT Transfer Goal: Bed to Chair/Chair to Bed - Progress: Goal set today Pt will Ambulate: 1 - 15 feet;with min assist;with rolling walker PT Goal: Ambulate - Progress: Goal set today Pt will Perform Home Exercise Program: Independently PT Goal: Perform Home Exercise Program - Progress: Goal set today Additional Goals Additional Goal #1: Pt able to recall 3/3 post hip prec for R LE. PT Goal: Additional Goal #1 - Progress: Goal set today  Visit Information  Last PT Received On: 05/27/12 Assistance Needed: +1    Subjective Data  Subjective: Pt received supine in bed with increased lethargy most likely due to medication. Daughter present and provided PTA status/mobility.   Prior Functioning  Home Living Lives With: Alone Available Help at Discharge: Personal care attendant;Available 24 hours/day Type of Home: House Home Access: Stairs to enter Entrance  Stairs-Number of Steps: 2 Home Layout: One level Additional Comments: daughter reports patient going  to rehab facility upon d/c Prior Function Level of Independence: Independent Able to Take Stairs?: Yes Driving: No Vocation: Retired Musician: No difficulties Dominant Hand: Right    Cognition  Overall Cognitive Status:  (pt with lethargy from medication) Arousal/Alertness: Lethargic Orientation Level:  (limited ability to assess due to lethargy) Behavior During Session: Lethargic    Extremity/Trunk Assessment Right Upper Extremity Assessment RUE ROM/Strength/Tone: [facility] for tasks assessed Left Upper Extremity Assessment LUE ROM/Strength/Tone: WFL for tasks assessed Right Lower Extremity Assessment RLE ROM/Strength/Tone: Deficits;Due to pain RLE ROM/Strength/Tone Deficits: no voluntary mvmt due to pain and lethargy Left Lower Extremity Assessment LLE ROM/Strength/Tone: WFL for tasks assessed (however resistive to move due to pain) Trunk Assessment Trunk Assessment: Normal   Balance Balance Balance Assessed: Yes Static Sitting Balance Static Sitting - Balance Support: Feet supported;Left upper extremity supported Static Sitting - Level of Assistance: 3: Mod assist;2: Max assist Static Sitting - Comment/# of Minutes: pt with strong L lateral lean due to patient with R hip pain  End of Session PT - End of Session Equipment Utilized During Treatment: Gait belt;Right knee immobilizer Activity Tolerance: Patient limited by pain Patient left: in bed;with call bell/phone within reach;with family/visitor present Nurse Communication: Mobility status  GP     Caroline Lewis 05/27/2012, 12:11 Caroline Lewis, PT, DPT Pager #: [phone] Office #: [phone]

## 2012-05-27 NOTE — Progress Notes (Signed)
ANTIBIOTIC CONSULT NOTE - INITIAL  Pharmacy Consult for Vancomycin/Levofloxacin Indication: pneumonia  No Known Allergies  Patient Measurements:  Total  Body Weight: 45kg - per 11/12  Vital Signs: Temp: 100 F (37.8 C) (07/03 1300) BP: 111/59 mmHg (07/03 1300) Pulse Rate: 110  (07/03 1300) Intake/Output from previous day: 07/02 0701 - 07/03 0700 In: 900 [I.V.:900] Out: 460 [Urine:460] Intake/Output from this shift: Total I/O In: 120 [P.O.:120] Out: -   Labs:  Basename 05/27/12 0500 05/26/12 0037 05/25/12 2202 05/25/12 1821  WBC 10.2 -- -- 11.5*  HGB 11.9* -- -- 14.1  PLT 175 -- -- 240  LABCREA -- -- -- --  CREATININE 0.55 0.58 0.57 --   The CrCl is unknown because both a height and weight (above a minimum accepted value) are required for this calculation. No results found for this basename: VANCOTROUGH:2,VANCOPEAK:2,VANCORANDOM:2,GENTTROUGH:2,GENTPEAK:2,GENTRANDOM:2,TOBRATROUGH:2,TOBRAPEAK:2,TOBRARND:2,AMIKACINPEAK:2,AMIKACINTROU:2,AMIKACIN:2, in the last 72 hours   Microbiology: Recent Results (from the past 720 hour(s))  URINE CULTURE     Status: Normal (Preliminary result)   Collection Time   05/25/12  6:46 PM      Component Value Range Status Comment   Specimen Description URINE, CLEAN CATCH   Final    Special Requests ADDED 2319   Final    Culture  Setup Time 05/25/2012 23:24   Final    Colony Count PENDING   Incomplete    Culture Culture reincubated for better growth   Final    Report Status PENDING   Incomplete   SURGICAL PCR SCREEN     Status: Normal   Collection Time   05/26/12  2:07 AM      Component Value Range Status Comment   MRSA, PCR NEGATIVE  NEGATIVE Final    Staphylococcus aureus NEGATIVE  NEGATIVE Final   SURGICAL PCR SCREEN     Status: Normal   Collection Time   05/26/12  4:44 PM      Component Value Range Status Comment   MRSA, PCR NEGATIVE  NEGATIVE Final    Staphylococcus aureus NEGATIVE  NEGATIVE Final     Medical History: Past Medical  History  Diagnosis Date  . Rheumatoid arthritis   . IBS (irritable bowel syndrome)   . Constipation   . Allergy     seasonal  . Cataract     surgery bilateral  . Osteoporosis   . Sciatica     Assessment: 87yof admitted for R Hip pain noted to have R hip fx now s/p R THA.  Currently anticoagulated on enoxparin 40mg  daily.  Noted on Cxr to also have pna - initally treated with Rocephin and Azith.  She complains of being hot all day today noted Tm 100.  Change to Vancomycin and Levofloxacin. Blood Cx and Ucx pending.  Goal of Therapy:  Vancomycin trough level 15-20 mcg/ml  Plan:  Vancomycin 750mg  IV q24  Levofloxacin 750mg  IV q48hr - dose reduced for renal fx   Marcelino Scot 05/27/2012,4:50 PM

## 2012-05-27 NOTE — Progress Notes (Signed)
OT Cancellation Note  Treatment cancelled today due to pt with limited participation with PT today and spoke with PT who feels tomorrow may be a better day to start OT.  Evette Georges 05/27/2012, 3:55 PM

## 2012-05-28 ENCOUNTER — Encounter (HOSPITAL_COMMUNITY): Payer: Self-pay | Admitting: General Practice

## 2012-05-28 LAB — CBC
HCT: 30 % — ABNORMAL LOW (ref 36.0–46.0)
Hemoglobin: 9.6 g/dL — ABNORMAL LOW (ref 12.0–15.0)
MCHC: 32 g/dL (ref 30.0–36.0)
MCV: 99 fL (ref 78.0–100.0)

## 2012-05-28 LAB — COMPREHENSIVE METABOLIC PANEL
Alkaline Phosphatase: 63 U/L (ref 39–117)
BUN: 14 mg/dL (ref 6–23)
Creatinine, Ser: 0.53 mg/dL (ref 0.50–1.10)
GFR calc Af Amer: >90 mL/min (ref >90)
Glucose, Bld: 107 mg/dL — ABNORMAL HIGH (ref 70–99)
Potassium: 4.1 mEq/L (ref 3.5–5.1)
Total Bilirubin: 0.4 mg/dL (ref 0.3–1.2)
Total Protein: 4.9 g/dL — ABNORMAL LOW (ref 6.0–8.3)

## 2012-05-28 NOTE — Progress Notes (Signed)
SUBJECTIVE Feels better. Was sweaty last night.  1. Closed subcapital fracture of femur   2. CAP (community acquired pneumonia)   3. Hip fracture, right   4. Rheumatoid arthritis   5. UTI (lower urinary tract infection)   6. Frequency of urination     Past Medical History  Diagnosis Date  . Rheumatoid arthritis   . IBS (irritable bowel syndrome)   . Constipation   . Allergy     seasonal  . Cataract     surgery bilateral  . Osteoporosis   . Sciatica    Current Facility-Administered Medications  Medication Dose Route Frequency Provider Last Rate Last Dose  . acetaminophen (TYLENOL) tablet 650 mg  650 mg Oral Q6H PRN Kalyiah Saintil, MD      . bisacodyl (DULCOLAX) EC tablet 5 mg  5 mg Oral Daily PRN Maretta Bees, MD   5 mg at 05/28/12 0809  . calcium carbonate (OS-CAL - dosed in mg of elemental calcium) tablet 500 mg of elemental calcium  1 tablet Oral BID WC Kathlen Mody, MD   500 mg of elemental calcium at 05/27/12 1750  . cholecalciferol (VITAMIN D) tablet 1,000 Units  1,000 Units Oral Daily Kathlen Mody, MD   1,000 Units at 05/27/12 1003  . darifenacin (ENABLEX) 24 hr tablet 7.5 mg  7.5 mg Oral Daily Maretta Bees, MD   7.5 mg at 05/28/12 0959  . enoxaparin (LOVENOX) injection 40 mg  40 mg Subcutaneous Q24H Maretta Bees, MD   40 mg at 05/28/12 1013  . folic acid (FOLVITE) tablet 1 mg  1 mg Oral BID Maretta Bees, MD   1 mg at 05/28/12 0959  . HYDROcodone-acetaminophen (NORCO) 5-325 MG per tablet 1-2 tablet  1-2 tablet Oral Q6H PRN Maretta Bees, MD   1 tablet at 05/28/12 0809  . HYDROmorphone (DILAUDID) injection 0.25-0.5 mg  0.25-0.5 mg Intravenous Q5 min PRN Remonia Richter, MD      . lactated ringers infusion   Intravenous Continuous Remonia Richter, MD 50 mL/hr at 05/28/12 0144    . levofloxacin (LEVAQUIN) IVPB 750 mg  750 mg Intravenous Q48H Milia Warth, MD   750 mg at 05/27/12 2140  . methocarbamol (ROBAXIN) tablet 500 mg  500 mg Oral Q6H PRN  Maretta Bees, MD   500 mg at 05/26/12 0554   Or  . methocarbamol (ROBAXIN) 500 mg in dextrose 5 % 50 mL IVPB  500 mg Intravenous Q6H PRN Maretta Bees, MD      . methotrexate (RHEUMATREX) tablet 20 mg  20 mg Oral Q Wed Shanker Levora Dredge, MD   20 mg at 05/27/12 1004  . metoCLOPramide (REGLAN) tablet 5-10 mg  5-10 mg Oral Q8H PRN Prince Rome, PA       Or  . metoCLOPramide (REGLAN) injection 5-10 mg  5-10 mg Intravenous Q8H PRN Prince Rome, PA      . morphine 2 MG/ML injection 0.5 mg  0.5 mg Intravenous Q2H PRN Maretta Bees, MD      . ondansetron [facility]) tablet 4 mg  4 mg Oral Q6H PRN Prince Rome, PA       Or  . ondansetron [facility]) injection 4 mg  4 mg Intravenous Q6H PRN Prince Rome, PA      . oxybutynin (DITROPAN-XL) 24 hr tablet 10 mg  10 mg Oral Daily Maretta Bees, MD   10 mg at 05/28/12 1003  . oxyCODONE (OXYCONTIN)  12 hr tablet 10 mg  10 mg Oral Q12H Maretta Bees, MD   10 mg at 05/26/12 2251  . senna (SENOKOT) tablet 8.6 mg  1 tablet Oral BID Maretta Bees, MD   8.6 mg at 05/28/12 1003  . vancomycin (VANCOCIN) 750 mg in sodium chloride 0.9 % 150 mL IVPB  750 mg Intravenous Q24H Nikolus Marczak, MD   750 mg at 05/27/12 1951  . DISCONTD: azithromycin (ZITHROMAX) 500 mg in dextrose 5 % 250 mL IVPB  500 mg Intravenous Q24H Maretta Bees, MD   500 mg at 05/26/12 2327  . DISCONTD: cefTRIAXone (ROCEPHIN) 1 g in dextrose 5 % 50 mL IVPB  1 g Intravenous Q24H Maretta Bees, MD   1 g at 05/26/12 2205  . DISCONTD: droperidol (INAPSINE) injection 0.625 mg  0.625 mg Intravenous PRN Remonia Richter, MD      . DISCONTD: levofloxacin (LEVAQUIN) IVPB 750 mg  750 mg Intravenous Q48H Shelli Portilla, MD       No Known Allergies Principal Problem:  *Hip fracture, right Active Problems:  Rheumatoid arthritis  PNA (pneumonia)  UTI (lower urinary tract infection)  Osteoporosis   Vital signs in last 24 hours: Temp:  [99.2 F (37.3 C)-100 F  (37.8 C)] 99.2 F (37.3 C) (07/04 0634) Pulse Rate:  [87-110] 87  (07/04 0634) Resp:  [18-20] 18  (07/04 0800) BP: (108-112)/(51-59) 112/58 mmHg (07/04 0634) SpO2:  [91 %-96 %] 96 % (07/04 0634) Weight change:  Last BM Date: 05/23/12  Intake/Output from previous day: 07/03 0701 - 07/04 0700 In: 120 [P.O.:120] Out: 250 [Urine:250] Intake/Output this shift: Total I/O In: 120 [P.O.:120] Out: -   Lab Results:  Basename 05/28/12 0420 05/27/12 0500  WBC 9.6 10.2  HGB 9.6* 11.9*  HCT 30.0* 37.3  PLT 177 175   BMET  Basename 05/28/12 0420 05/27/12 0500  NA 137 136  K 4.1 4.2  CL 101 101  CO2 28 23  GLUCOSE 107* 104*  BUN 14 14  CREATININE 0.53 0.55  CALCIUM 8.6 8.9    Studies/Results: No results found.  Medications: I have reviewed the patient's current medications.   Physical exam GENERAL- alert HEAD- normal atraumatic, no neck masses, normal thyroid, no jvd RESPIRATORY- appears well, vitals normal, no respiratory distress, acyanotic, normal RR, ear and throat exam is normal, neck free of mass or lymphadenopathy, chest clear, no wheezing, crepitations, rhonchi, normal symmetric air entry CVS- regular rate and rhythm, S1, S2 normal, no murmur, click, rub or gallop ABDOMEN- abdomen is soft without significant tenderness, masses, organomegaly or guarding NEURO- Grossly normal EXTREMITIES- extremities normal, atraumatic, no cyanosis or edema. No bleed right leg.  Plan  * Hip fracture, right- Traumatic versus pathological. Appreciate ortho. S/p fixation. For str when bed available, hopefully Friday.  * PNA (pneumonia)/ gram negative UTI (lower urinary tract infection)-Defervesced. Await ID. Continue vacn/levaquin for now. * Osteoporosis/Rheumatoid arthritis- continue home meds. * Acute blood loss anemia post op- may need prbc transfusion. Check Hb  in am, if lower transfuse.  * dvt prophylaxis.    Bena Kobel 05/28/2012 11:20 AM Pager: [phone].

## 2012-05-28 NOTE — Progress Notes (Addendum)
PATIENT ID: Caroline Lewis   2 Days Post-Op Procedure(s) (LRB): ARTHROPLASTY BIPOLAR HIP (Right)  Subjective: The patient is doing well. Pain is well-controlled. She has not yet been out of bed since surgery.  Objective:  Filed Vitals:   05/28/12 0634  BP: 112/58  Pulse: 87  Temp: 99.2 F (37.3 C)  Resp: 18     Right hip dressing clean dry and intact. No calf tenderness or swelling. Distally she is neurovascularly intact.  Labs:   [facility] 05/28/12 0420 05/27/12 0500 05/25/12 1821  HGB 9.6* 11.9* 14.1   Basename 05/28/12 0420 05/27/12 0500  WBC 9.6 10.2  RBC 3.03* 3.66*  HCT 30.0* 37.3  PLT 177 175   Basename 05/28/12 0420 05/27/12 0500  NA 137 136  K 4.1 4.2  CL 101 101  CO2 28 23  BUN 14 14  CREATININE 0.53 0.55  GLUCOSE 107* 104*  CALCIUM 8.6 8.9    Assessment and Plan: Doing well postoperative day #2 after right hip hemiarthroplasty for fracture Weightbearing as tolerated with physical therapy. Acute blood loss anemia: She is asymptomatic, continue to observe for now  VTE proph:  Lovenox while in the hospital, then Enteric-coated aspirin twice daily for 2 weeks.

## 2012-05-28 NOTE — Progress Notes (Signed)
Spoke with pt and pt's daughter re: d/c plans.  They had questions re: if they would need to go to SNF vs Home. Explained to them that where the patient discharges to would be dependent upon whether her [employer] Medicare would approve SNF and if not, the patient's ability to privately pay for that level of care. Home with HH is an option, but the daughter wanted to speak with CSW about SNF placement first before making final decision.   Spoke with CSW, Elvina Sidle, in hall and asked her to speak with pt and daughter.

## 2012-05-28 NOTE — Progress Notes (Signed)
PT Progress Note:     05/28/12 1200  PT Visit Information  Last PT Received On 05/28/12  Assistance Needed +2  PT Time Calculation  PT Start Time 1121  PT Stop Time 1151  PT Time Calculation (min) 30 min  Subjective Data  Subjective Pt requesting to use bathroom.   Precautions  Precautions Posterior Hip  Precaution Booklet Issued Yes (comment)  Precaution Comments Handout provided & reviewed.   Required Braces or Orthoses Knee Immobilizer - Right  Knee Immobilizer - Right (when in bed to assist with adhereing to hip prec)  Restrictions  RLE Weight Bearing WBAT  Cognition  Overall Cognitive Status Impaired  Area of Impairment Following commands;Safety/judgement;Problem solving  Arousal/Alertness Awake/alert  Orientation Level Appears intact for tasks assessed  Behavior During Session [facility] for tasks performed  Following Commands Other (comment);Follows one step commands inconsistently (slow to process)  Safety/Judgement Decreased awareness of safety precautions;Decreased safety judgement for tasks assessed  Problem Solving (required step-by-step cues/instructions for tasks)  Cognition - Other Comments Pt's daughter present entire session & reports pt's current cognitive defecits are not baseline.    Bed Mobility  Bed Mobility Supine to Sit;Sitting - Scoot to Edge of Bed  Supine to Sit 1: +2 Total assist;HOB elevated;With rails  Supine to Sit: Patient Percentage 10%  Sitting - Scoot to Edge of Bed 1: +1 Total assist (pt=20%. )  Details for Bed Mobility Assistance max directional cues & required repitition of cues.  (A) for all components of transition.    Transfers  Transfers Sit to Stand;Stand to Sit;Stand Pivot Transfers  Sit to Stand 1: +2 Total assist;With upper extremity assist;From bed;From chair/3-in-1  Sit to Stand: Patient Percentage 20%  Stand to Sit 1: +2 Total assist;With upper extremity assist;With armrests;To chair/3-in-1  Stand to Sit: Patient Percentage 20%    Stand Pivot Transfers 1: +2 Total assist  Stand Pivot Transfers: Patient Percentage 20%  Details for Transfer Assistance Max directional cues for technique.  Trialed bed>3-in-1 with RW but pt required total (A) to manage RW & pt with significant hip flexion, therefore trialed 3-in-1>recliner without RW (face-to-face position).  Max cueing/facilitation for trunk/hip/knee extension.  Pt required total (A) from 2nd person to move RLE.    Ambulation/Gait  Ambulation/Gait Assistance Not tested (comment)  Stairs No  Wheelchair Mobility  Wheelchair Mobility No  Balance  Balance Assessed No  PT - End of Session  Equipment Utilized During Treatment Gait belt;Right knee immobilizer  Activity Tolerance Patient tolerated treatment well  Patient left in chair;with call bell/phone within reach;with family/visitor present  Nurse Communication Mobility status  PT - Assessment/Plan  Comments on Treatment Session Pt more alert today but mobility still very limited.  Required +2 total (A) for all mobility.  Would cont to benefit from SNF to maximize functional recovery for safe d/c home.    PT Plan Discharge plan remains appropriate  PT Frequency Min 5X/week  Follow Up Recommendations Skilled nursing facility  Equipment Recommended Defer to next venue  Acute Rehab PT Goals  Time For Goal Achievement 06/10/12  PT Goal: Supine/Side to Sit - Progress Not progressing  PT Goal: Sit to Stand - Progress Progressing toward goal  PT Transfer Goal: Bed to Chair/Chair to Bed - Progress Progressing toward goal  Additional Goals  PT Goal: Additional Goal #1 - Progress Progressing toward goal    Pain:  No reports of pain  Verdell Face, PTA [phone] 05/28/2012

## 2012-05-29 ENCOUNTER — Encounter (HOSPITAL_COMMUNITY): Payer: Self-pay | Admitting: Orthopaedic Surgery

## 2012-05-29 LAB — BASIC METABOLIC PANEL WITH GFR
BUN: 12 mg/dL (ref 6–23)
CO2: 30 meq/L (ref 19–32)
Calcium: 8.5 mg/dL (ref 8.4–10.5)
Chloride: 103 meq/L (ref 96–112)
Creatinine, Ser: 0.64 mg/dL (ref 0.50–1.10)
GFR calc Af Amer: >90 mL/min
GFR calc non Af Amer: 79 mL/min — ABNORMAL LOW
Glucose, Bld: 103 mg/dL — ABNORMAL HIGH (ref 70–99)
Potassium: 4 meq/L (ref 3.5–5.1)
Sodium: 139 meq/L (ref 135–145)

## 2012-05-29 LAB — CBC
HCT: 27.6 % — ABNORMAL LOW (ref 36.0–46.0)
Hemoglobin: 8.9 g/dL — ABNORMAL LOW (ref 12.0–15.0)
MCH: 31.7 pg (ref 26.0–34.0)
MCHC: 32.2 g/dL (ref 30.0–36.0)
MCV: 98.2 fL (ref 78.0–100.0)
Platelets: 172 K/uL (ref 150–400)
RBC: 2.81 MIL/uL — ABNORMAL LOW (ref 3.87–5.11)
RDW: 14 % (ref 11.5–15.5)
WBC: 7.4 K/uL (ref 4.0–10.5)

## 2012-05-29 LAB — URINE CULTURE: Colony Count: >=100000

## 2012-05-29 MED ORDER — LEVOFLOXACIN 750 MG PO TABS
750.0000 mg | ORAL_TABLET | ORAL | Status: DC
  Administered 2012-05-29: 750 mg via ORAL
  Filled 2012-05-29: qty 1

## 2012-05-29 MED ORDER — WHITE PETROLATUM GEL
Status: AC
  Filled 2012-05-29: qty 5

## 2012-05-29 NOTE — Progress Notes (Signed)
Utilization review completed. Evon Lopezperez, RN, BSN. 

## 2012-05-29 NOTE — Progress Notes (Signed)
Unsuccessful in attempts to obtain [facility] authorization for SNF today.  Discussed with Dr. Venetia Constable- he is does not feel patient is medically stable yet for d/c. Discussed with Pershing Cox, RNCM.  Will revisit Monday.  Lorri Frederick. Jamesen Stahnke, LCSWA  [phone]

## 2012-05-29 NOTE — Progress Notes (Signed)
Physical Therapy Treatment Note   05/29/12 1035  PT Visit Information  Last PT Received On 05/29/12  Assistance Needed +2  PT Time Calculation  PT Start Time 1035  PT Stop Time 1108  PT Time Calculation (min) 33 min  Subjective Data  Subjective Pt received sitting up in chair with improved cognition and ability to maintain eye opening. Patient agreeable to attempt to ambulate.  Precautions  Precautions Posterior Hip  Precaution Booklet Issued Yes (comment)  Precaution Comments Handout provided & reviewed.   Required Braces or Orthoses Knee Immobilizer - Right (when in bed)  Restrictions  RLE Weight Bearing WBAT  Cognition  Overall Cognitive Status Appears within functional limits for tasks assessed/performed  Transfers  Transfers Sit to Stand;Stand to Sit  Sit to Stand 1: +2 Total assist;From chair/3-in-1  Sit to Stand: Patient Percentage 60%  Stand to Sit 1: +2 Total assist;With upper extremity assist;With armrests;To chair/3-in-1  Stand to Sit: Patient Percentage 40%  Details for Transfer Assistance max v/c's for hand placement and R LE management  Ambulation/Gait  Ambulation/Gait Assistance 1: +2 Total assist  Ambulation/Gait: Patient Percentage 50%  Ambulation Distance (Feet) 3 Feet  Assistive device Rolling walker  Ambulation/Gait Assistance Details max directional v/c's assist to off weight R LE, max verbal cues to increase bilat UE WBing. pt c/o onset of fatigue not the pain in R LE. Patient able to advance R LE with minmal assist. significant increase in trunk flexion  Gait Pattern Step-to pattern;Decreased step length - right;Decreased stance time - right;Decreased stride length  Gait velocity slow  Stairs No  Exercises  Exercises Total Joint (HEP hand out provided)  Total Joint Exercises  Ankle Circles/Pumps AROM;Both;10 reps  Quad Sets AROM;Right;10 reps;Seated  Gluteal Sets AROM;Both;10 reps;Seated  Heel Slides AAROM;Right;10 reps;Seated  Long Arc Quad  AROM;Right;10 reps;Seated  PT - End of Session  Equipment Utilized During Treatment Gait belt  Activity Tolerance Patient limited by fatigue  Patient left in chair;with call bell/phone within reach;with family/visitor present  Nurse Communication Mobility status  PT - Assessment/Plan  Comments on Treatment Session Pt with significant improvement in ability to participate in PT and ability to stay awake. Pt remains to benefit from SNF placement to achieve maximal functional recovery for safe d/c home. Patient remains to require maxA for all mobiltiy and ADLs.  PT Plan Discharge plan remains appropriate;Frequency remains appropriate  PT Frequency Min 5X/week  Follow Up Recommendations Skilled nursing facility  Equipment Recommended Defer to next venue  Acute Rehab PT Goals  Time For Goal Achievement 06/10/12  PT Goal: Sit to Stand - Progress Progressing toward goal  PT Goal: Ambulate - Progress Progressing toward goal  PT Goal: Perform Home Exercise Program - Progress Progressing toward goal  Additional Goals  PT Goal: Additional Goal #1 - Progress Progressing toward goal  PT General Charges  $$ ACUTE PT VISIT 1 Procedure  PT Treatments  $Gait Training 8-22 mins  $Therapeutic Exercise 8-22 mins     Pain: R LE not to bad in regard of pain  Lewis Shock, PT, DPT Pager #: [phone] Office #: [phone]

## 2012-05-29 NOTE — Progress Notes (Signed)
Patient/family has chosen [facility] for short term SNF. Spoke with daughter Caroline Lewis- requested that family proceed with signing admission papers as we await auth from [facility] as well as medical stability per MD.  CSW will continue to monitor.  Lorri Frederick. [place]  [phone]

## 2012-05-29 NOTE — Clinical Social Work Psychosocial (Signed)
     Clinical Social Work Department BRIEF PSYCHOSOCIAL ASSESSMENT 05/29/2012  Patient:  Caroline Lewis, Caroline Lewis     Account Number:  [id number]     Admit date:  05/25/2012  Clinical Social Worker:  Burnard Hawthorne  Date/Time:  05/28/2012 03:00 PM  Referred by:  Physician  Date Referred:  05/28/2012 Referred for  SNF Placement   Other Referral:   Interview type:  Other - See comment Other interview type:   Met with patient and 2 daughters- Larita Fife and [place]    PSYCHOSOCIAL DATA Living Status:  ALONE Admitted from facility:   Level of care:   Primary support name:  Jenean Lindau  [phone] Primary support relationship to patient:  CHILD, ADULT Degree of support available:   Strong support by daughters. Someone is staying with her around the clock per family.    CURRENT CONCERNS Current Concerns  Post-Acute Placement   Other Concerns:    SOCIAL WORK ASSESSMENT / PLAN C'SW referred to assist this patient with short term SNF placement. She has been a resident in the past of [facility]. Patient would like to return there and referral made. They currently do not have any beds available.  Discussed bed search process and will initiate bed search in [facility]. Daughters will be very involved in placement process.   Assessment/plan status:  Psychosocial Support/Ongoing Assessment of Needs Other assessment/ plan:   Information/referral to community resources:   SNF bed search list provided  Discussed home care options of Home Health, private duty care and DME as patient and family wanted to know "options" for care    PATIENTS/FAMILYS RESPONSE TO PLAN OF CARE: Patient is alert, oriented and very pleasant. She has been a resident in th epast of [facility] and is agreeable to return there.  Daughters feel that SNF is indicated but want patient to be placed in a "good SNF where she will receive appropriate care." They are invovled and supportive of their mother.

## 2012-05-29 NOTE — Clinical Social Work Placement (Addendum)
    Clinical Social Work Department CLINICAL SOCIAL WORK PLACEMENT NOTE 05/29/2012  Patient:  Caroline Lewis, Caroline Lewis  Account Number:  [id number] Admit date:  05/25/2012  Clinical Social Worker:  Lupita Leash Val Farnam, BSW  Date/time:  05/28/2012 01:30 PM  Clinical Social Work is seeking post-discharge placement for this patient at the following level of care:   SKILLED NURSING   (*CSW will update this form in Epic as items are completed)   05/28/2012  Patient/family provided with [facility] Department of Clinical Social Work's list of facilities offering this level of care within the geographic area requested by the patient (or if unable, by the patient's family).  05/28/2012  Patient/family informed of their freedom to choose among providers that offer the needed level of care, that participate in Medicare, Medicaid or managed care program needed by the patient, have an available bed and are willing to accept the patient.  05/28/2012  Patient/family informed of [facility]' ownership interest in [facility], as well as of the fact that they are under no obligation to receive care at this facility.  PASARR submitted to EDS on EXISTING PASARR # PASARR number received from EDS on   FL2 transmitted to all facilities in geographic area requested by pt/family on  05/28/2012 FL2 transmitted to all facilities within larger geographic area on   Patient informed that his/her managed care company has contracts with or will negotiate with  certain facilities, including the following:   [employer]- requires authorization prior to SNF placement.    Has existing PASARR number     Patient/family informed of bed offers received:  05/28/2012 Patient chooses bed at [facility] Physician recommends and patient chooses bed at  [facility]  Patient to be transferred to  on  05/30/2012 Dorma Russell) Patient to be transferred to facility by Darnell Level)  The following physician request were  entered in Epic:   Additional Comments:

## 2012-05-29 NOTE — Progress Notes (Signed)
Received call from [facility] at [facility] Auth number received!  Discussed with Dr. Venetia Constable- he will consider d/c tomorrow if [facility] is stable.  Ok for SNF tomorrow and report left for weekend CSW.  Notified patient's daughters of above.  Lorri Frederick. Hannah Crill, LCSWA  [phone]

## 2012-05-29 NOTE — Progress Notes (Signed)
Subjective: 3 Days Post-Op Procedure(s) (LRB): ARTHROPLASTY BIPOLAR HIP (Right)  Activity level:  slow oob Diet tolerance:  light Voiding:  ok Patient reports pain as 2 on 0-10 scale.    Objective: Vital signs in last 24 hours: Temp:  [98 F (36.7 C)-98.9 F (37.2 C)] 98.9 F (37.2 C) (07/05 0702) Pulse Rate:  [77-80] 77  (07/05 0702) Resp:  [16-18] 18  (07/05 0702) BP: (120-123)/(72-76) 123/76 mmHg (07/05 0702) SpO2:  [97 %-98 %] 97 % (07/05 0702) Weight:  [45.36 kg (100 lb)] 45.36 kg (100 lb) (07/04 1453)  Labs:  Basename 05/29/12 0600 05/28/12 0420 05/27/12 0500  HGB 8.9* 9.6* 11.9*    Basename 05/29/12 0600 05/28/12 0420  WBC 7.4 9.6  RBC 2.81* 3.03*  HCT 27.6* 30.0*  PLT 172 177    Basename 05/29/12 0600 05/28/12 0420  NA 139 137  K 4.0 4.1  CL 103 101  CO2 30 28  BUN 12 14  CREATININE 0.64 0.53  GLUCOSE 103* 107*  CALCIUM 8.5 8.6   No results found for this basename: LABPT:2,INR:2 in the last 72 hours  Physical Exam:  Neurologically intact ABD soft Neurovascular intact Sensation intact distally Intact pulses distally No cellulitis present Compartment soft Dressing changed  Assessment/Plan:  3 Days Post-Op Procedure(s) (LRB): ARTHROPLASTY BIPOLAR HIP (Right) Advance diet Up with therapy Discharge to SNF Will let med service transfuse prn. Seems ok athgb 8.9 SNF ASA 325 BID x 2 weeks WBAT  RTO 2 weeks    Jennae Hakeem R 05/29/2012, 7:47 AM

## 2012-05-29 NOTE — Progress Notes (Signed)
SUBJECTIVE "I feel all right". No complaints.   1. Closed subcapital fracture of femur   2. CAP (community acquired pneumonia)   3. Hip fracture, right   4. Rheumatoid arthritis   5. UTI (lower urinary tract infection)   6. Frequency of urination     Past Medical History  Diagnosis Date  . Rheumatoid arthritis   . IBS (irritable bowel syndrome)   . Constipation   . Allergy     seasonal  . Osteoporosis   . Sciatica   . Peripheral vascular disease     "beyond help"  . Pneumonia 05/25/12; 2010   Current Facility-Administered Medications  Medication Dose Route Frequency Provider Last Rate Last Dose  . acetaminophen (TYLENOL) tablet 650 mg  650 mg Oral Q6H PRN Ezekial Arns, MD      . bisacodyl (DULCOLAX) EC tablet 5 mg  5 mg Oral Daily PRN Maretta Bees, MD   5 mg at 05/29/12 1013  . calcium carbonate (OS-CAL - dosed in mg of elemental calcium) tablet 500 mg of elemental calcium  1 tablet Oral BID WC Kathlen Mody, MD   500 mg of elemental calcium at 05/29/12 0842  . cholecalciferol (VITAMIN D) tablet 1,000 Units  1,000 Units Oral Daily Kathlen Mody, MD   1,000 Units at 05/29/12 1013  . darifenacin (ENABLEX) 24 hr tablet 7.5 mg  7.5 mg Oral Daily Maretta Bees, MD   7.5 mg at 05/29/12 1013  . enoxaparin (LOVENOX) injection 40 mg  40 mg Subcutaneous Q24H Maretta Bees, MD   40 mg at 05/29/12 1013  . folic acid (FOLVITE) tablet 1 mg  1 mg Oral BID Maretta Bees, MD   1 mg at 05/29/12 1013  . HYDROcodone-acetaminophen (NORCO) 5-325 MG per tablet 1-2 tablet  1-2 tablet Oral Q6H PRN Maretta Bees, MD   1 tablet at 05/29/12 1018  . HYDROmorphone (DILAUDID) injection 0.25-0.5 mg  0.25-0.5 mg Intravenous Q5 min PRN Remonia Richter, MD      . levofloxacin [facility]) IVPB 750 mg  750 mg Intravenous Q48H Ilai Hiller, MD   750 mg at 05/27/12 2140  . methocarbamol (ROBAXIN) tablet 500 mg  500 mg Oral Q6H PRN Maretta Bees, MD   500 mg at 05/26/12 0554   Or  .  methocarbamol (ROBAXIN) 500 mg in dextrose 5 % 50 mL IVPB  500 mg Intravenous Q6H PRN Maretta Bees, MD      . methotrexate (RHEUMATREX) tablet 20 mg  20 mg Oral Q Wed Shanker Levora Dredge, MD   20 mg at 05/27/12 1004  . metoCLOPramide (REGLAN) tablet 5-10 mg  5-10 mg Oral Q8H PRN Prince Rome, PA       Or  . metoCLOPramide (REGLAN) injection 5-10 mg  5-10 mg Intravenous Q8H PRN Prince Rome, PA      . morphine 2 MG/ML injection 0.5 mg  0.5 mg Intravenous Q2H PRN Maretta Bees, MD      . ondansetron [facility]) tablet 4 mg  4 mg Oral Q6H PRN Prince Rome, PA       Or  . ondansetron [facility]) injection 4 mg  4 mg Intravenous Q6H PRN Prince Rome, PA      . oxybutynin (DITROPAN-XL) 24 hr tablet 10 mg  10 mg Oral Daily Maretta Bees, MD   10 mg at 05/29/12 1013  . oxyCODONE (OXYCONTIN) 12 hr tablet 10 mg  10 mg Oral Q12H Shanker M  Ghimire, MD   10 mg at 05/26/12 2251  . senna (SENOKOT) tablet 8.6 mg  1 tablet Oral BID Maretta Bees, MD   8.6 mg at 05/29/12 1013  . DISCONTD: vancomycin (VANCOCIN) 750 mg in sodium chloride 0.9 % 150 mL IVPB  750 mg Intravenous Q24H Sabrea Sankey, MD   750 mg at 05/28/12 2037   No Known Allergies Principal Problem:  *Hip fracture, right Active Problems:  Rheumatoid arthritis  PNA (pneumonia)  UTI (lower urinary tract infection)  Osteoporosis   Vital signs in last 24 hours: Temp:  [98.6 F (37 C)-98.9 F (37.2 C)] 98.9 F (37.2 C) (07/05 0702) Pulse Rate:  [77-78] 77  (07/05 0702) Resp:  [16-18] 18  (07/05 1150) BP: (120-123)/(75-76) 123/76 mmHg (07/05 0702) SpO2:  [96 %-98 %] 96 % (07/05 1150) Weight:  [45.36 kg (100 lb)] 45.36 kg (100 lb) (07/04 1453) Weight change:  Last BM Date: 05/23/12 (ducolax given today)  Intake/Output from previous day: 07/04 0701 - 07/05 0700 In: 390 [P.O.:240; IV Piggyback:150] Out: -  Intake/Output this shift:    Lab Results:  Basename 05/29/12 0600 05/28/12 0420  WBC 7.4 9.6  HGB  8.9* 9.6*  HCT 27.6* 30.0*  PLT 172 177   BMET  Basename 05/29/12 0600 05/28/12 0420  NA 139 137  K 4.0 4.1  CL 103 101  CO2 30 28  GLUCOSE 103* 107*  BUN 12 14  CREATININE 0.64 0.53  CALCIUM 8.5 8.6    Studies/Results: No results found.  Medications: I have reviewed the patient's current medications.   Physical exam GENERAL- alert HEAD- normal atraumatic, no neck masses, normal thyroid, no jvd RESPIRATORY- appears well, vitals normal, no respiratory distress, acyanotic, normal RR, ear and throat exam is normal, neck free of mass or lymphadenopathy, chest clear, no wheezing, crepitations, rhonchi, normal symmetric air entry CVS- regular rate and rhythm, S1, S2 normal, no murmur, click, rub or gallop ABDOMEN- abdomen is soft without significant tenderness, masses, organomegaly or guarding NEURO- Grossly normal EXTREMITIES- extremities normal, atraumatic, no cyanosis or edema  Plan  * Hip fracture, right- Traumatic versus pathological. Appreciate ortho. S/p fixation. For str when bed available.  * PNA (pneumonia)/ E.Coli/citrobacter UTI (lower urinary tract infection)-Defervesced. Reviewed UC sensititivities. BC no growth to date. Will deescalate abx to levaquin only, to complete 7 days. * Osteoporosis/Rheumatoid arthritis- continue home meds.  * Acute blood loss anemia post op- may need prbc transfusion. Check Hb in am, if lower transfuse, wants daughter to help her decide if she will go ahead with transfusion or not.  * dvt prophylaxis.        Caroline Lewis 05/29/2012 1:27 PM Pager: [phone].

## 2012-05-30 DIAGNOSIS — D638 Anemia in other chronic diseases classified elsewhere: Secondary | ICD-10-CM | POA: Diagnosis not present

## 2012-05-30 LAB — CBC
HCT: 28.1 % — ABNORMAL LOW (ref 36.0–46.0)
Hemoglobin: 9.4 g/dL — ABNORMAL LOW (ref 12.0–15.0)
MCV: 99.3 fL (ref 78.0–100.0)
WBC: 6.6 10*3/uL (ref 4.0–10.5)

## 2012-05-30 MED ORDER — HYDROCODONE-ACETAMINOPHEN 5-500 MG PO TABS: 1.0000 | ORAL_TABLET | Freq: Four times a day (QID) | ORAL | Status: DC | PRN

## 2012-05-30 MED ORDER — ASPIRIN EC 325 MG PO TBEC: 325.0000 mg | DELAYED_RELEASE_TABLET | Freq: Every day | ORAL | Status: AC

## 2012-05-30 MED ORDER — LEVOFLOXACIN 750 MG PO TABS: 750.0000 mg | ORAL_TABLET | ORAL | Status: AC

## 2012-05-30 MED ORDER — ACETAMINOPHEN 325 MG PO TABS: 650.0000 mg | ORAL_TABLET | Freq: Four times a day (QID) | ORAL | Status: DC | PRN

## 2012-05-30 MED ORDER — BISACODYL 5 MG PO TBEC: 5.0000 mg | DELAYED_RELEASE_TABLET | Freq: Every day | ORAL | Status: AC | PRN

## 2012-05-30 MED ORDER — OXYCODONE HCL 10 MG PO TB12: 10.0000 mg | ORAL_TABLET | Freq: Two times a day (BID) | ORAL | Status: DC

## 2012-05-30 MED ORDER — FERROUS SULFATE 325 (65 FE) MG PO TABS: 325.0000 mg | ORAL_TABLET | Freq: Three times a day (TID) | ORAL | Status: DC

## 2012-05-30 MED ORDER — METHOCARBAMOL 500 MG PO TABS: 500.0000 mg | ORAL_TABLET | Freq: Four times a day (QID) | ORAL | Status: AC | PRN

## 2012-05-30 NOTE — Progress Notes (Signed)
Pt to transfer to [facility] today via [facility]. D/C packet complete with signed FL2, chart copy, and signed hard scripts.  Pt, pt's dtr, Orlie Pollen (at bedside), and SNF aware of d/c.  RN to call CSW when ready for transport. CSW signing off as no other CSW needs identified Dellie Burns, MSW, [place] [phone] (weekend)

## 2012-05-30 NOTE — Progress Notes (Signed)
PATIENT ID: Caroline Lewis   4 Days Post-Op Procedure(s) (LRB): ARTHROPLASTY BIPOLAR HIP (Right)  Subjective: Doing well. No new Concerns.  Objective:  Filed Vitals:   05/30/12 0411  BP: 100/52  Pulse: 83  Temp: 98.3 F (36.8 C)  Resp: 16     Hip dressing clean dry and intact. Distally neurovascularly intact.  Labs:   [facility] 05/30/12 0650 05/29/12 0600 05/28/12 0420  HGB 9.4* 8.9* 9.6*   Basename 05/30/12 0650 05/29/12 0600  WBC 6.6 7.4  RBC 2.83* 2.81*  HCT 28.1* 27.6*  PLT 195 172   Basename 05/29/12 0600 05/28/12 0420  NA 139 137  K 4.0 4.1  CL 103 101  CO2 30 28  BUN 12 14  CREATININE 0.64 0.53  GLUCOSE 103* 107*  CALCIUM 8.5 8.6    Assessment and Plan: Status post right hip hemiarthroplasty for fracture. Okay for DC from ortho standpoint. Continue weightbearing as tolerated with posterior hip precautions Followup Dr. Jerl Santos 2 weeks from surgery. VTE proph: Enteric-coated aspirin twice daily after discharge for 2 weeks.

## 2012-05-30 NOTE — Progress Notes (Signed)
Orthopedic Tech Progress Note Patient Details:  Caroline Lewis 1924-03-17 [id number]  Patient ID: Caroline Lewis, female   DOB: 1924/08/27, 78 y.o.   MRN: [id number] Patient has brace on knee.  Manasvi Dickard T 05/30/2012, 1:07 PM

## 2012-05-30 NOTE — Discharge Summary (Signed)
DISCHARGE SUMMARY  Caroline Lewis  MR#: [id number]  DOB:Dec 01, 1923  Date of Admission: 05/25/2012 Date of Discharge: 05/30/2012  Attending Physician:Glenisha Gundry  Patient's WUJ:WJXB,JYNWGNF, MD  Consults:Treatment Team:  Velna Ochs, MD  Discharge Diagnoses: Present on Admission:  .Hip fracture, right .PNA (pneumonia) .Rheumatoid arthritis .UTI (lower urinary tract infection) .Osteoporosis  Hospital Course: Ms belzer was admitted with right hip pain and found to have right femoral fracture, which was subsequently fixed by Dr Jerl Santos. She was also found to have community acquired pneumonia, and E.coli/citrobacter uti. She has done well with abx, and should complete 5 more doses of levaquin. She developed acute blood loss anemia, and remains hemodynamically stable. She should take iron supplements, and continue aspirin for 2 weeks per ortho recommendation. Ortho will follow patient in office in 2 weeks. She is discharged in stable condition to snf.  Medication List  As of 05/30/2012  9:44 AM   TAKE these medications         acetaminophen 325 MG tablet   Commonly known as: TYLENOL   Take 2 tablets (650 mg total) by mouth every 6 (six) hours as needed (first dose now pliz.).      aspirin EC 325 MG tablet   Take 1 tablet (325 mg total) by mouth daily.      bisacodyl 5 MG EC tablet   Commonly known as: DULCOLAX   Take 1 tablet (5 mg total) by mouth daily as needed.      calcium carbonate 600 MG Tabs   Commonly known as: OS-CAL   Take 600 mg by mouth 2 (two) times daily with a meal.      ferrous sulfate 325 (65 FE) MG tablet   Take 1 tablet (325 mg total) by mouth 3 (three) times daily with meals.      folic acid 1 MG tablet   Commonly known as: FOLVITE   Take 1 mg by mouth 2 (two) times daily.      GLUCOSAMINE PO   Take 5-10 mLs by mouth daily.      HYDROcodone-acetaminophen 5-500 MG per tablet   Commonly known as: VICODIN   Take 1 tablet by mouth every 6 (six)  hours as needed.      levofloxacin 750 MG tablet   Commonly known as: LEVAQUIN   Take 1 tablet (750 mg total) by mouth every other day.      methocarbamol 500 MG tablet   Commonly known as: ROBAXIN   Take 1 tablet (500 mg total) by mouth every 6 (six) hours as needed.      methotrexate 2.5 MG tablet   Take 20 mg by mouth once a week. Take 8 tablets by mouth every wednesday      ONE-A-DAY WOMENS PO   Take 1 tablet by mouth daily.      oxybutynin 10 MG 24 hr tablet   Commonly known as: DITROPAN-XL   Take 10 mg by mouth daily.      oxyCODONE 10 MG 12 hr tablet   Commonly known as: OXYCONTIN   Take 1 tablet (10 mg total) by mouth every 12 (twelve) hours.      STOOL SOFTENER LAXATIVE PO   Take 1 tablet by mouth daily.      traMADol 50 MG tablet   Commonly known as: ULTRAM   Take 100 mg by mouth every 6 (six) hours as needed.      VESICARE 5 MG tablet   Generic drug: solifenacin   Take 10  mg by mouth daily.      Vitamin D 1000 UNITS capsule   Take 1,000 Units by mouth daily.             Day of Discharge BP 100/52  Pulse 83  Temp 98.3 F (36.8 C) (Oral)  Resp 16  Ht 5' (1.524 m)  Wt 45.36 kg (100 lb)  BMI 19.53 kg/m2  SpO2 95%  Physical Exam: Comfortable at rest.  Results for orders placed during the hospital encounter of 05/25/12 (from the past 24 hour(s))  CBC     Status: Abnormal   Collection Time   05/30/12  6:50 AM      Component Value Range   WBC 6.6  4.0 - 10.5 K/uL   RBC 2.83 (*) 3.87 - 5.11 MIL/uL   Hemoglobin 9.4 (*) 12.0 - 15.0 g/dL   HCT 40.9 (*) 81.1 - 91.4 %   MCV 99.3  78.0 - 100.0 fL   MCH 33.2  26.0 - 34.0 pg   MCHC 33.5  30.0 - 36.0 g/dL   RDW 78.2  95.6 - 21.3 %   Platelets 195  150 - 400 K/uL    Disposition: to snf today.   Follow-up Appts: Discharge Orders    Future Orders Please Complete By Expires   Diet - low sodium heart healthy      Increase activity slowly         Follow-up Information    Follow up with  Velna Ochs, MD in 2 weeks. ([facility]AT ,ASA 325 bid for 2 weeks)    Contact information:   [address] [place] [address] [phone]          Tests Needing Follow-up: Cbc in 1 week.  Time spent in discharge (includes decision making & examination of pt):  Signed: Filiberto Wamble 05/30/2012, 9:44 AM

## 2012-05-30 NOTE — Progress Notes (Signed)
Weekend CSW following for possible d/c. Please call CSW at number below if pt stable for d/c today.  Dellie Burns, MSW, [place] [phone] (weekend)

## 2012-06-02 LAB — CULTURE, BLOOD (ROUTINE X 2): Culture: NO GROWTH

## 2012-07-01 ENCOUNTER — Ambulatory Visit: Payer: Medicare HMO | Admitting: Family Medicine

## 2012-07-10 ENCOUNTER — Ambulatory Visit (INDEPENDENT_AMBULATORY_CARE_PROVIDER_SITE_OTHER): Payer: Medicare HMO | Admitting: Family Medicine

## 2012-07-10 ENCOUNTER — Encounter: Payer: Self-pay | Admitting: Family Medicine

## 2012-07-10 VITALS — BP 120/69 | HR 99 | Temp 98.2°F | Ht 60.0 in | Wt 98.4 lb

## 2012-07-10 DIAGNOSIS — Z13220 Encounter for screening for lipoid disorders: Secondary | ICD-10-CM

## 2012-07-10 DIAGNOSIS — M069 Rheumatoid arthritis, unspecified: Secondary | ICD-10-CM

## 2012-07-10 DIAGNOSIS — N3941 Urge incontinence: Secondary | ICD-10-CM

## 2012-07-10 DIAGNOSIS — Z96649 Presence of unspecified artificial hip joint: Secondary | ICD-10-CM

## 2012-07-10 DIAGNOSIS — M810 Age-related osteoporosis without current pathological fracture: Secondary | ICD-10-CM

## 2012-07-10 MED ORDER — TOLTERODINE TARTRATE ER 2 MG PO CP24: 2.0000 mg | ORAL_CAPSULE | Freq: Every day | ORAL | Status: DC

## 2012-07-10 NOTE — Patient Instructions (Addendum)
Let me know if you have fosamax.  I will need to know the strength and how often you take it.  Remember, it must be taken while sitting up and on an empty stomach. I sent one prescription for a generic drug that replaces the vesicare.  You can use up the vesicare you already have. You should not get the shingles vaccine as long as you are on methotrexate. Please remember to do the shoulder exercises. I will call with your cholesterol results. Try to get the bone density results from [place].

## 2012-07-11 LAB — LIPID PANEL
HDL: 85 mg/dL (ref >39)
LDL Cholesterol: 88 mg/dL (ref 0–99)
Triglycerides: 81 mg/dL (ref <150)
VLDL: 16 mg/dL (ref 0–40)

## 2012-07-13 ENCOUNTER — Other Ambulatory Visit: Payer: Self-pay | Admitting: Family Medicine

## 2012-07-13 ENCOUNTER — Encounter: Payer: Self-pay | Admitting: Family Medicine

## 2012-07-13 DIAGNOSIS — M810 Age-related osteoporosis without current pathological fracture: Secondary | ICD-10-CM

## 2012-07-13 MED ORDER — ALENDRONATE SODIUM 70 MG PO TABS: 70.0000 mg | ORAL_TABLET | ORAL | Status: DC

## 2012-07-13 NOTE — Assessment & Plan Note (Signed)
Called to inform about cholesterol.  Not taking fosamax.  Will prescribe.  Reminded about taking properly.

## 2012-07-13 NOTE — Progress Notes (Signed)
  Subjective:    Patient ID: Caroline Lewis, female    DOB: [birth year], 78 y.o.   MRN: [id number]  HPI  Initial visit: Seems to be doing reasonably well.  Recent hip replacement.  She has been receiving care on a regular basis.  Issues are: 1. Would like me to refill methotrexate.  Declined and advised that requires monitoring due to potential serious side effects. 2. Left shoulder discomfort x 1 month.  Previous rt shoulder surg for rotator cuff issues. 3. Osteoporosis.  She may or may not be on fosamax.  She will check.  Also, did not seem to be aware of how to take the med. 4. On vesicare for urge incontinence.  Will switch to generic detrol for cost savings. 5. Recent hip surg.  Recovering well.  Still weak and at risk for falls.    Review of Systems  Denies CP, SOB, change in bowel or bladder, bleeding.     Objective:   Physical Exam Heent Nl Lungs clear Cardiac RRR without m or g Abd benign. Ext Lt shoulder + impingement.       Assessment & Plan:

## 2012-07-13 NOTE — Assessment & Plan Note (Signed)
Switch from vesicare to detrol for cost savings.

## 2012-07-13 NOTE — Assessment & Plan Note (Signed)
Check labs 

## 2012-07-13 NOTE — Assessment & Plan Note (Signed)
Continue current Rx.  No zostavax (live virus) while on methotrexate.

## 2012-07-13 NOTE — Assessment & Plan Note (Signed)
Reports BMD in last year while in [facility].  Will try to get results.  Also, check if on fosamax and let me know.

## 2012-07-31 ENCOUNTER — Encounter: Payer: Self-pay | Admitting: Family Medicine

## 2012-08-04 ENCOUNTER — Encounter: Payer: Self-pay | Admitting: Family Medicine

## 2012-08-12 ENCOUNTER — Encounter: Payer: Self-pay | Admitting: Family Medicine

## 2012-08-12 ENCOUNTER — Ambulatory Visit (INDEPENDENT_AMBULATORY_CARE_PROVIDER_SITE_OTHER): Payer: Medicare HMO | Admitting: Family Medicine

## 2012-08-12 VITALS — BP 118/66 | HR 86 | Temp 98.2°F | Ht 60.0 in | Wt 106.7 lb

## 2012-08-12 DIAGNOSIS — N3941 Urge incontinence: Secondary | ICD-10-CM

## 2012-08-12 DIAGNOSIS — H16139 Photokeratitis, unspecified eye: Secondary | ICD-10-CM

## 2012-08-12 MED ORDER — TOLTERODINE TARTRATE ER 2 MG PO CP24: 2.0000 mg | ORAL_CAPSULE | Freq: Two times a day (BID) | ORAL | Status: DC

## 2012-08-12 NOTE — Patient Instructions (Addendum)
OK to take your detrol twice daily I froze off several benign skin problems. Make an appointment to see me to have the problem on your back cut out.  Keep taking your fosamax. Remind me to freeze the nose problem

## 2012-08-13 ENCOUNTER — Telehealth: Payer: Self-pay | Admitting: *Deleted

## 2012-08-13 DIAGNOSIS — H16139 Photokeratitis, unspecified eye: Secondary | ICD-10-CM | POA: Insufficient documentation

## 2012-08-13 NOTE — Progress Notes (Signed)
  Subjective:    Patient ID: Caroline Lewis, female    DOB: 09/01/1924, 78 y.o.   MRN: [id number]  HPI Concerning skin lesions on back. One is where a BCE was previously removed. Chronic back pain stable Also wants to use detrol twice daily.  It seems to only last 12 hours.  It does work well    Review of Systems     Objective:   Physical Exam Three small lesions - either BCE or AKs frozen on back.  The one near the previous surgical scar is large and will need eliptical removal.        Assessment & Plan:

## 2012-08-13 NOTE — Telephone Encounter (Signed)
PA required for Detrol LA. Form given to Dr. Leveda Anna.

## 2012-08-13 NOTE — Telephone Encounter (Signed)
Form faxed to insurance company. 

## 2012-08-13 NOTE — Assessment & Plan Note (Signed)
Three lesions frozen.  Instructions on local care given.  Return for elective removal of the larger lesion.

## 2012-08-14 NOTE — Telephone Encounter (Signed)
Denial received from insurance for Detrol LA. Notice placed in MD box.

## 2012-08-17 MED ORDER — TOLTERODINE TARTRATE ER 4 MG PO CP24: 4.0000 mg | ORAL_CAPSULE | Freq: Every day | ORAL | Status: DC

## 2012-08-17 NOTE — Telephone Encounter (Signed)
Discussed denial with patient.  We will try detrol 35m daily

## 2012-09-09 ENCOUNTER — Ambulatory Visit: Payer: Medicare HMO | Admitting: Family Medicine

## 2012-09-11 ENCOUNTER — Encounter: Payer: Self-pay | Admitting: Family Medicine

## 2012-09-11 ENCOUNTER — Ambulatory Visit (INDEPENDENT_AMBULATORY_CARE_PROVIDER_SITE_OTHER): Payer: Medicare HMO | Admitting: Family Medicine

## 2012-09-11 VITALS — BP 134/52 | HR 70 | Temp 98.2°F | Ht 60.0 in | Wt 103.0 lb

## 2012-09-11 DIAGNOSIS — M545 Low back pain, unspecified: Secondary | ICD-10-CM

## 2012-09-11 DIAGNOSIS — H16139 Photokeratitis, unspecified eye: Secondary | ICD-10-CM

## 2012-09-11 MED ORDER — GABAPENTIN 100 MG PO CAPS: 100.0000 mg | ORAL_CAPSULE | Freq: Three times a day (TID) | ORAL | Status: DC

## 2012-09-11 NOTE — Progress Notes (Signed)
  Subjective:    Patient ID: Caroline Lewis, female    DOB: 1924-04-20, 78 y.o.   MRN: [id number]  HPI Pain has mult skin lesions that she wants frozen. Also complains of worsening back, hip leg and some abd pain.  "My arthritis has gone crazy."  She has diffuse osteoarthritis and terrible arthritis/scoliosis of the lumbar spine    Review of Systems     Objective:   Physical Exam Lungs clear Abd benign. Ext no edema       Assessment & Plan:

## 2012-09-11 NOTE — Assessment & Plan Note (Signed)
Feel she likely has a neuropathic componant to her chronic pain.  Will add gabapentin rather than go up on narcotics.

## 2012-09-11 NOTE — Assessment & Plan Note (Signed)
Froze multiple on back and arms.

## 2012-09-11 NOTE — Patient Instructions (Addendum)
The new medication is a nerve pain medicine.  Start by taking once a day and build to taking three times daily.  Gabapentin Because of the methotrexate, you should not take the shingles vaccine.

## 2012-11-09 ENCOUNTER — Telehealth: Payer: Self-pay | Admitting: Family Medicine

## 2012-11-09 NOTE — Telephone Encounter (Signed)
Pt is asking for script for a chair that sits on the outside of tub and swivels to the inside - wants to talk to [facility]

## 2012-11-10 NOTE — Telephone Encounter (Signed)
Will forward to Dr Hensel 

## 2012-11-10 NOTE — Telephone Encounter (Signed)
Will forward this also to Dr Leveda Anna

## 2012-11-10 NOTE — Telephone Encounter (Signed)
Called patient.  She would like the Rx for chair called into [facility].  Called [facility].  They say insurance rarely covers a shower chair - they consider it a convenience item.  They asked me to fax an RX and they would submit.  Christoper Allegra fax [phone]

## 2012-11-10 NOTE — Telephone Encounter (Signed)
Patient wants to let Dr. Leveda Anna know that she will be leaving for [place] in a week and she wants to take this chair with her at that time.  [facility] on [address] is the only place her insurance will approve for her to get this chair through.

## 2013-02-19 ENCOUNTER — Encounter: Payer: Self-pay | Admitting: Family Medicine

## 2013-02-19 DIAGNOSIS — S72002A Fracture of unspecified part of neck of left femur, initial encounter for closed fracture: Secondary | ICD-10-CM | POA: Insufficient documentation

## 2013-02-19 DIAGNOSIS — S72002D Fracture of unspecified part of neck of left femur, subsequent encounter for closed fracture with routine healing: Secondary | ICD-10-CM

## 2013-02-19 NOTE — Progress Notes (Signed)
  Subjective:    Patient ID: Caroline Lewis, female    DOB: 1924/11/19, 79 y.o.   MRN: [id number]  HPI Received notes from ortho.  Patient fell 02/14/13 and suffered Lt hip fracture which had screw fixation.      Review of Systems     Objective:   Physical Exam        Assessment & Plan:

## 2013-04-09 ENCOUNTER — Telehealth: Payer: Self-pay | Admitting: Family Medicine

## 2013-04-09 NOTE — Telephone Encounter (Signed)
Pt thinks she is experiencing pain in under her ribs when she coughs or sneezes. Please advise.

## 2013-04-09 NOTE — Telephone Encounter (Signed)
Pt states that she has pain under the left side of her ribs that moves around and is worse when she coughs or sneezes. Denies any radiating pain, chest pain or nausea. Recommended rest, motrin/tylenol - if symptoms get worse then to call office or seek immediate treatment. Appointment made for Monday for further evaluation. Wyatt Haste, RN-BSN

## 2013-04-12 ENCOUNTER — Ambulatory Visit (INDEPENDENT_AMBULATORY_CARE_PROVIDER_SITE_OTHER): Payer: Medicare HMO | Admitting: Family Medicine

## 2013-04-12 VITALS — BP 115/72 | HR 87 | Temp 98.8°F

## 2013-04-12 DIAGNOSIS — R0789 Other chest pain: Secondary | ICD-10-CM

## 2013-04-12 DIAGNOSIS — R071 Chest pain on breathing: Secondary | ICD-10-CM

## 2013-04-12 MED ORDER — NAPROXEN 500 MG PO TABS: 250.0000 mg | ORAL_TABLET | Freq: Two times a day (BID) | ORAL | Status: DC

## 2013-04-12 NOTE — Assessment & Plan Note (Signed)
Most likely pleuritic chest pain due to inflammation- risk factor RA.  Will terat with short course of NSAID- advised caution.  Discussed options for further work-up patient opts-  If no improvement, asked them to call office, will order chest xray and thoracic and lumbar spine to eval for pulmonary and stress fracture as causes of symptoms.  No symptoms of cardiopulmonary etiology. Low likelihood of PE, pleural effusion, pericarditis.  Will continue to monitor symptoms closely- advised to seek further care for any worsening of symptoms

## 2013-04-12 NOTE — Patient Instructions (Addendum)
Take naproxen- one half to one tablet twice a day for pain.  Take with food.    If no improvement in a few days, call office and will order chest xray  If new symptoms or worsening, come back to office for a recheck

## 2013-04-12 NOTE — Progress Notes (Signed)
  Subjective:    Patient ID: Caroline Lewis, female    DOB: 1924/09/20, 79 y.o.   MRN: [id number]  HPI 74 here for evaluation of left rib pain  Duration about 1 week.  Worse when taking a deep breath or having hiccups.  No pain at rest.  Started at left rib, now with pain across chest and upper back.  Overall course stable.  Concerned because not resolved yet.  States similar to episodes of "pleurisy" 3 months ago which self resolved.  I have reviewed patient's  PMH, FH, and Social history and Medications as related to this visit. Sig for fall in march 2014 with hip fracture.  Also with Osteoporosis on alendronate and RA.   Review of Systems No dyspnea, cough, sputum, cold symtpoms, fever.  Otherwise at baseline state of health.     Objective:   Physical Exam GEN: Alert & Oriented, No acute distress, sitting in wheelchair,frail.  Here with her son. CV:  Regular Rate & Rhythm, no murmur, no friction rub Respiratory:  Normal work of breathing, CTA, coarse throughout MSK;  No pain on palpation of spine or ribs Ext: no edema        Assessment & Plan:

## 2013-05-18 ENCOUNTER — Other Ambulatory Visit (HOSPITAL_COMMUNITY): Payer: Self-pay | Admitting: *Deleted

## 2013-05-19 ENCOUNTER — Encounter (HOSPITAL_COMMUNITY)
Admission: RE | Admit: 2013-05-19 | Discharge: 2013-05-19 | Disposition: A | Discharge status: Home / Self Care | Payer: Medicare HMO | Source: Ambulatory Visit | Attending: Internal Medicine | Admitting: Internal Medicine

## 2013-05-19 DIAGNOSIS — Z96649 Presence of unspecified artificial hip joint: Secondary | ICD-10-CM | POA: Insufficient documentation

## 2013-05-19 DIAGNOSIS — M069 Rheumatoid arthritis, unspecified: Secondary | ICD-10-CM | POA: Insufficient documentation

## 2013-05-19 DIAGNOSIS — M810 Age-related osteoporosis without current pathological fracture: Secondary | ICD-10-CM | POA: Insufficient documentation

## 2013-05-19 MED ORDER — ZOLEDRONIC ACID 5 MG/100ML IV SOLN
INTRAVENOUS | Status: AC
  Administered 2013-05-19: 5 mg via INTRAVENOUS
  Filled 2013-05-19: qty 100

## 2013-05-19 MED ORDER — ZOLEDRONIC ACID 5 MG/100ML IV SOLN
5.0000 mg | Freq: Once | INTRAVENOUS | Status: AC
  Administered 2013-05-19: 5 mg via INTRAVENOUS

## 2013-06-10 ENCOUNTER — Telehealth: Payer: Self-pay | Admitting: Family Medicine

## 2013-06-10 NOTE — Telephone Encounter (Signed)
Pt is requesting a overnight catheter and was not sure how to proceed. JW

## 2013-06-10 NOTE — Telephone Encounter (Signed)
This is an unusual request and she is overdue for a visit.  Please ask her to schedule an appointment to discuss further and to check her other problems.

## 2013-06-10 NOTE — Telephone Encounter (Signed)
Pt told to call and schedule an OV.  All she would say is the "her bladder goes crazy" at night sometimes.  Shah Insley, Darlyne Russian, CMA

## 2013-06-10 NOTE — Telephone Encounter (Signed)
Will fwd to MD for advice.  Naylea Wigington L, CMA  

## 2013-06-23 ENCOUNTER — Encounter: Payer: Self-pay | Admitting: Family Medicine

## 2013-06-23 ENCOUNTER — Ambulatory Visit
Admission: RE | Admit: 2013-06-23 | Discharge: 2013-06-23 | Disposition: A | Discharge status: Home / Self Care | Payer: Medicare HMO | Source: Ambulatory Visit | Attending: Family Medicine | Admitting: Family Medicine

## 2013-06-23 ENCOUNTER — Ambulatory Visit (INDEPENDENT_AMBULATORY_CARE_PROVIDER_SITE_OTHER): Payer: Medicare HMO | Admitting: Family Medicine

## 2013-06-23 VITALS — BP 121/49 | HR 97 | Temp 100.4°F

## 2013-06-23 DIAGNOSIS — R509 Fever, unspecified: Secondary | ICD-10-CM

## 2013-06-23 DIAGNOSIS — J189 Pneumonia, unspecified organism: Secondary | ICD-10-CM | POA: Insufficient documentation

## 2013-06-23 DIAGNOSIS — M810 Age-related osteoporosis without current pathological fracture: Secondary | ICD-10-CM

## 2013-06-23 DIAGNOSIS — N3941 Urge incontinence: Secondary | ICD-10-CM

## 2013-06-23 DIAGNOSIS — M069 Rheumatoid arthritis, unspecified: Secondary | ICD-10-CM

## 2013-06-23 LAB — CBC
HCT: 35.8 % — ABNORMAL LOW (ref 36.0–46.0)
Hemoglobin: 11.5 g/dL — ABNORMAL LOW (ref 12.0–15.0)
MCH: 29.2 pg (ref 26.0–34.0)
MCV: 90.9 fL (ref 78.0–100.0)
RBC: 3.94 MIL/uL (ref 3.87–5.11)

## 2013-06-23 MED ORDER — TOLTERODINE TARTRATE 2 MG PO TABS: 2.0000 mg | ORAL_TABLET | Freq: Every day | ORAL | Status: DC

## 2013-06-23 NOTE — Assessment & Plan Note (Signed)
Stable, but I am concerned with Rt. UQ pain on methotrexate.  Check LFTs

## 2013-06-23 NOTE — Assessment & Plan Note (Addendum)
No focal symptoms Update: Only focal symptom was Rt upper abd, lower chest discomfort.  High WBC and questionable CXR is highly suggestive of early pneumonia.  Will treat.

## 2013-06-23 NOTE — Assessment & Plan Note (Signed)
Severe, off alendronate.  Received reclast (once per year) at [facility] on 05/19/13.

## 2013-06-23 NOTE — Patient Instructions (Addendum)
The medicine you received iv on May 19, 2013 is reclast which is once per year to protect your bones. Try the detrol at night for your bladder.  Be careful if you need to get up at night so you don't fall. I will call with lab and Chest X-ray results,

## 2013-06-23 NOTE — Assessment & Plan Note (Signed)
Trial of QHS detrol

## 2013-06-23 NOTE — Progress Notes (Signed)
  Subjective:    Patient ID: Caroline Lewis, female    DOB: [birth year], 79 y.o.   MRN: [id number]  HPI   Low grade fever and weakness x 2 days.  Has some chronic RUQ pain which she wonders if may be related (early pleurisy.)  Denies cough, head congestion, sore throat, skin infections, urgency, frequency or dysuria.  On chronic, low dose methotrexate.  I do not have any recent LFTs.  The RUQ pain highlights the need for these now.  Osteoporosis, not on alendronate.  Could not tolerate.    Urinary frequency at night did not improve much with long acting detrol.  Will try short acting at hs only  Was living in [place] with daughter.  For now, back in [place].  Review of Systems     Objective:   Physical Exam Lungs clear Skin, no infection Chest wall pain in Rt. Lower ribs.  No hepatomegally although kyphosis makes exam difficult       Assessment & Plan:

## 2013-06-24 LAB — COMPLETE METABOLIC PANEL WITH GFR
ALT: 17 U/L (ref 0–35)
AST: 23 U/L (ref 0–37)
Alkaline Phosphatase: 79 U/L (ref 39–117)
Creat: 0.86 mg/dL (ref 0.50–1.10)
Sodium: 135 mEq/L (ref 135–145)
Total Bilirubin: 0.6 mg/dL (ref 0.3–1.2)

## 2013-06-24 MED ORDER — AZITHROMYCIN 500 MG PO TABS: 500.0000 mg | ORAL_TABLET | Freq: Every day | ORAL | Status: DC

## 2013-06-24 NOTE — Addendum Note (Signed)
Addended by: Tivis Ringer on: 06/24/2013 09:11 AM   Modules accepted: Orders, Medications

## 2013-07-01 LAB — POCT URINALYSIS DIPSTICK
Bilirubin, UA: NEGATIVE
Leukocytes, UA: NEGATIVE
Nitrite, UA: NEGATIVE
Protein, UA: NEGATIVE
pH, UA: 8

## 2013-07-01 NOTE — Addendum Note (Signed)
Addended by: [place], Suhana Wilner on: 07/01/2013 11:15 AM   Modules accepted: Orders

## 2013-07-16 IMAGING — CR DG CHEST 2V
2 series · 2 of 2 positions shown · non-contrast
Comparison: 02/28/2010

CLINICAL DATA: Preoperative respiratory exam for knee surgery.

CHEST - 2 VIEW

[w chest pa]
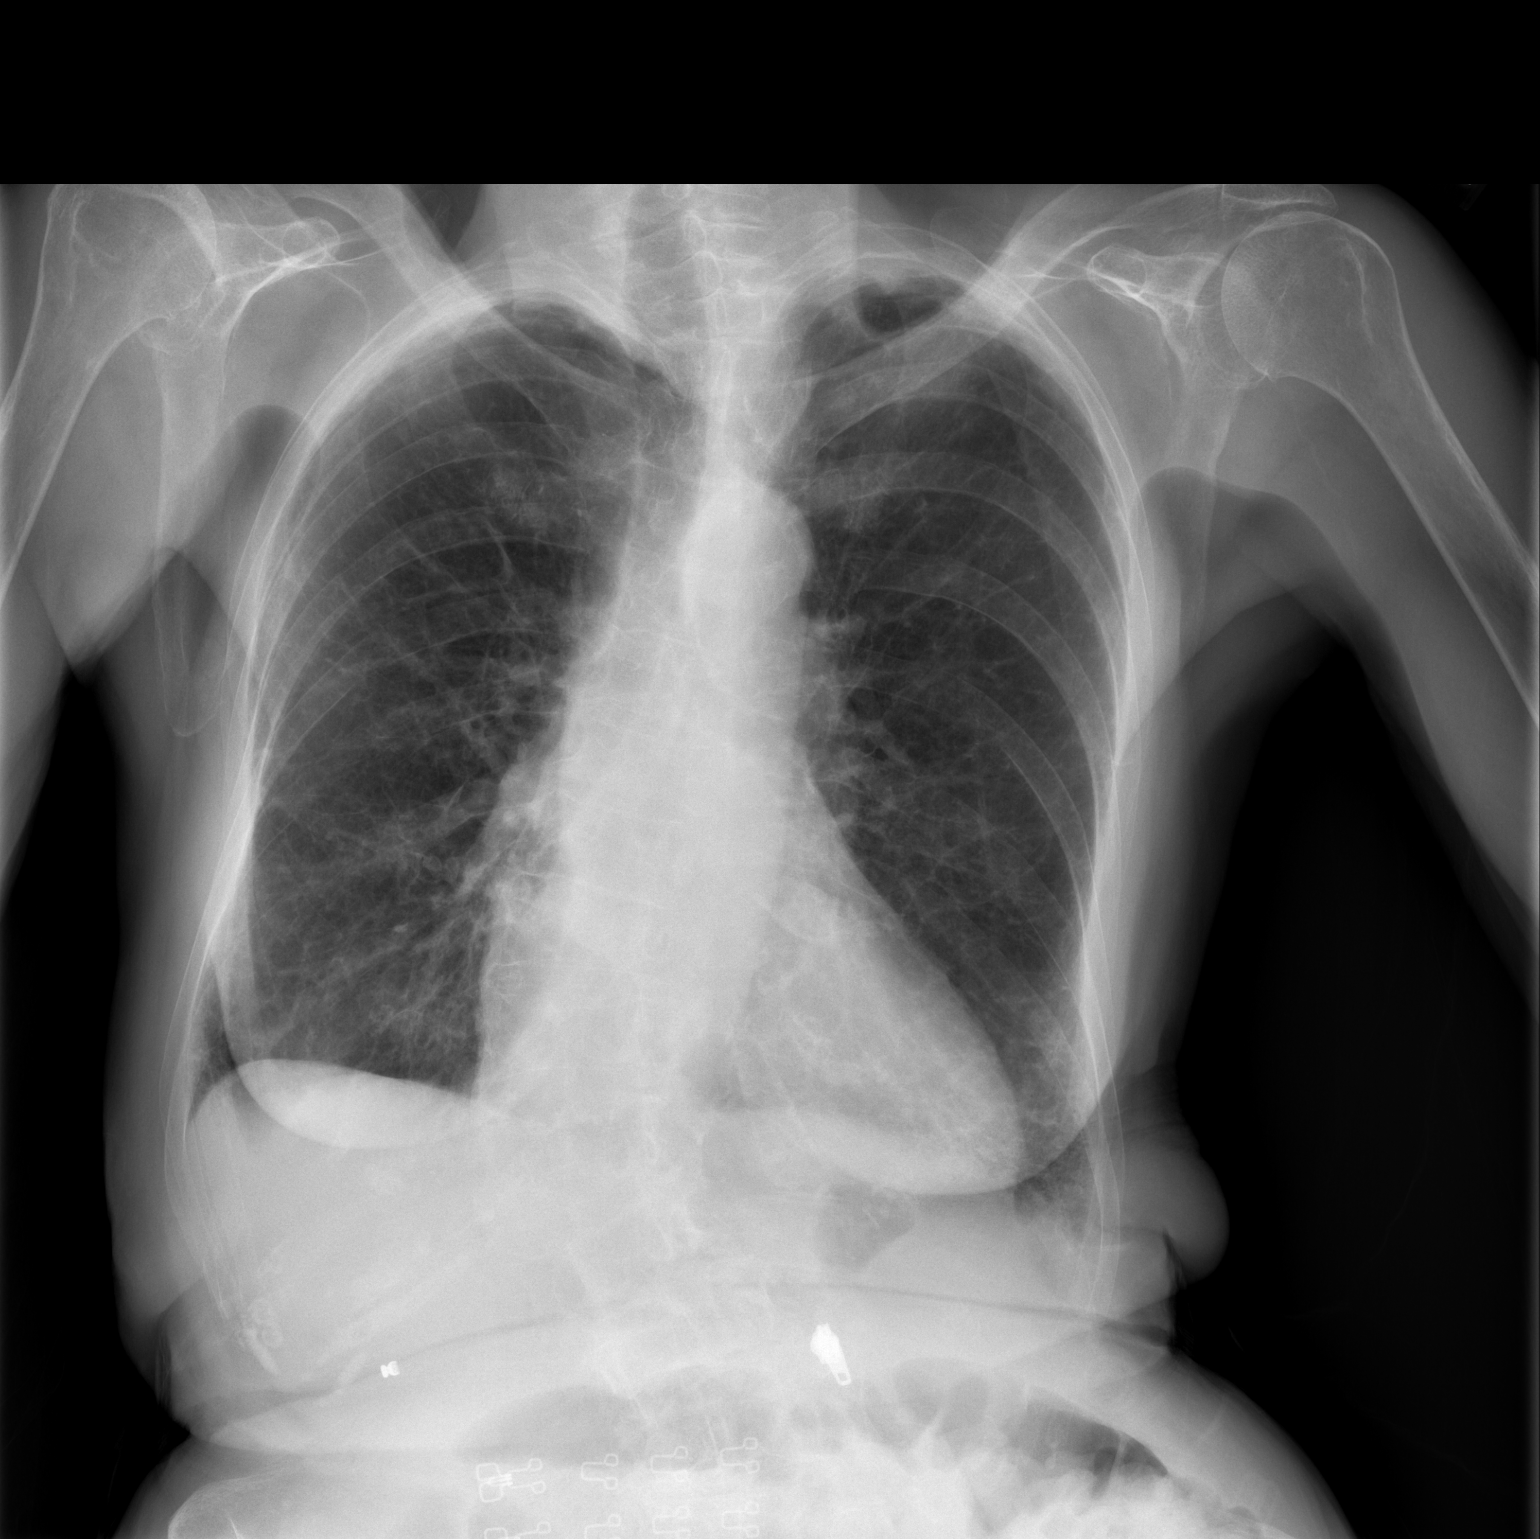

[w chest lat]
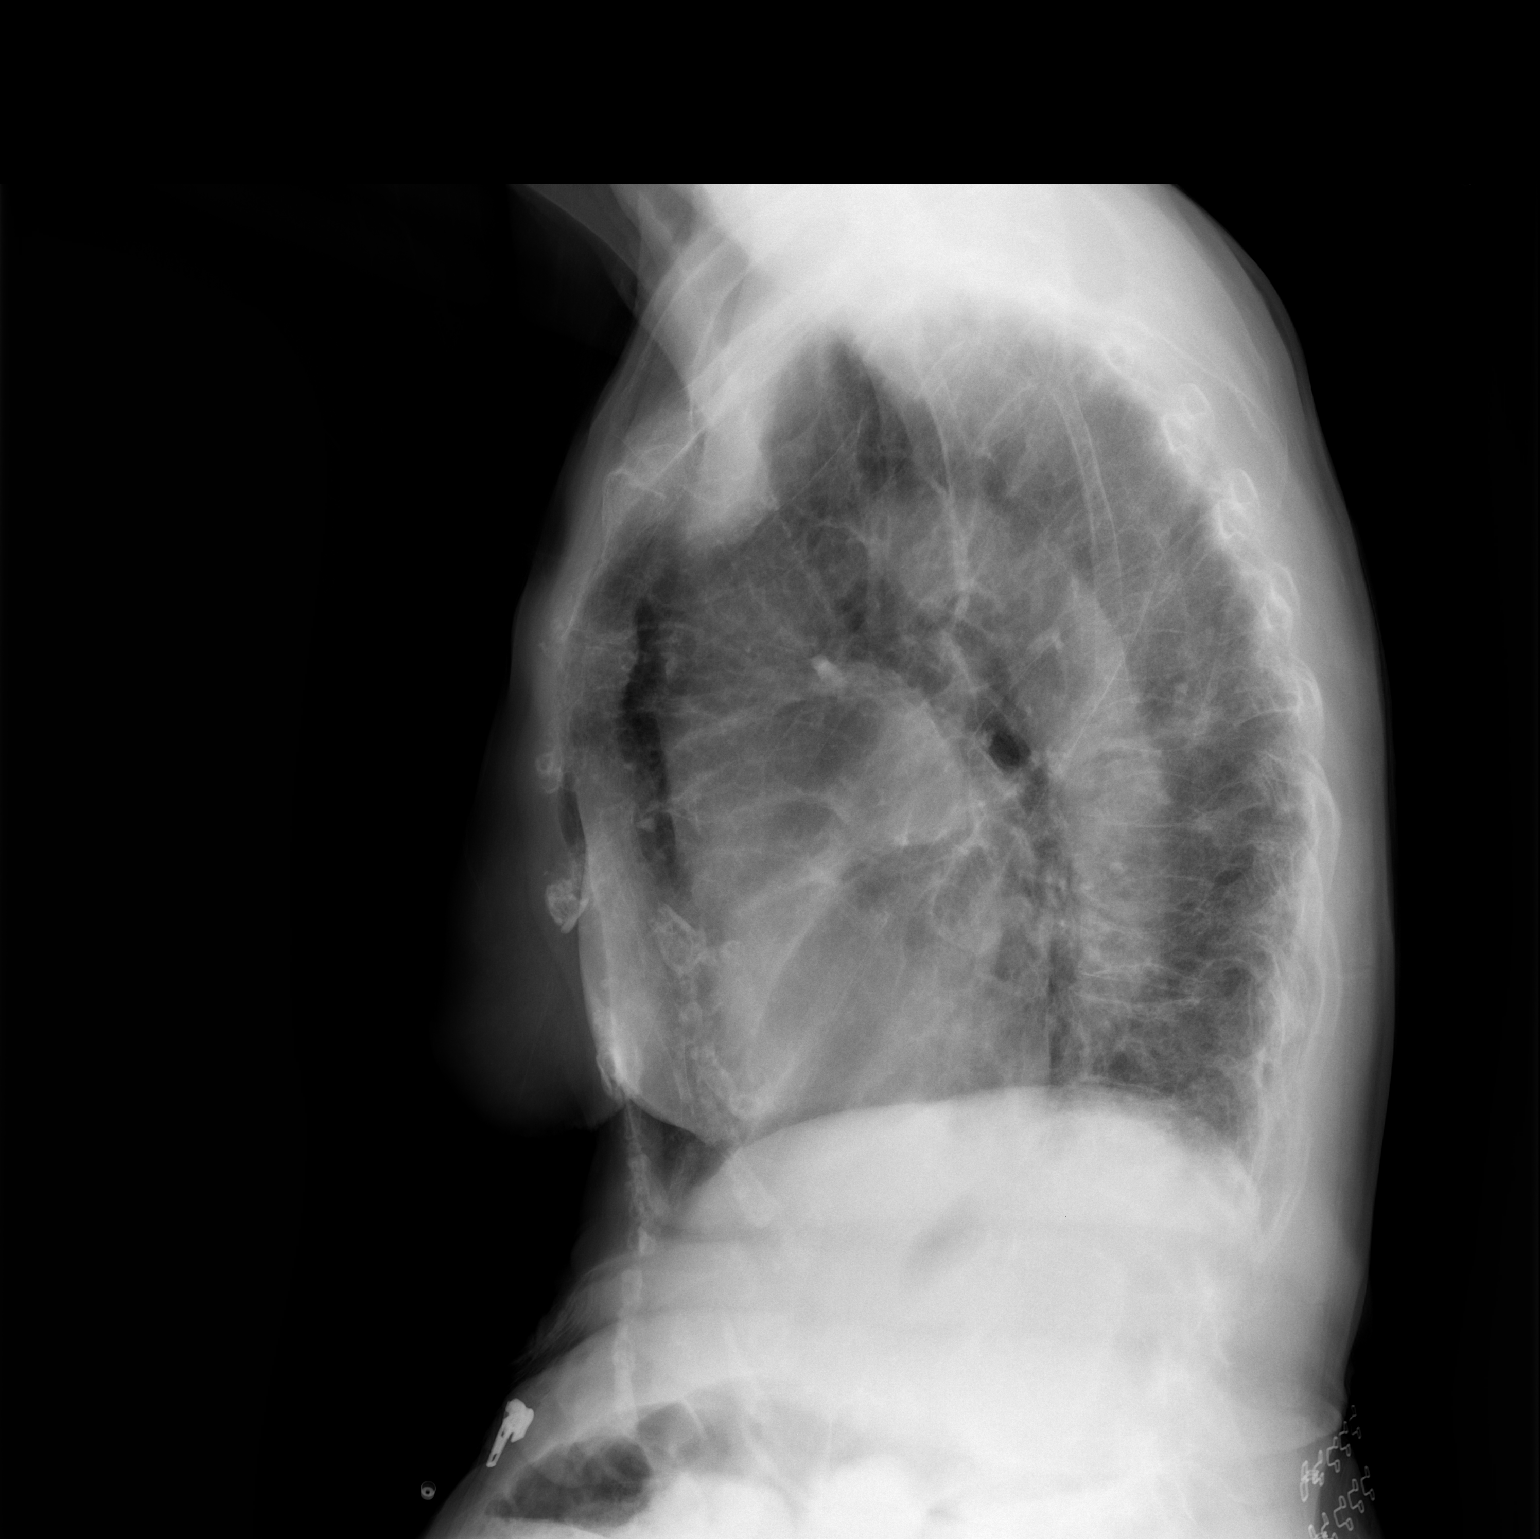

[2 of 2 positions shown; findings below may reference images not displayed]

FINDINGS: Stable chronic lung disease.  No edema, infiltrate or
pulmonary nodule.  Heart size is stable.  No pleural effusions.
Bony thorax shows stable degenerative changes of the spine.
IMPRESSION: Stable chronic lung disease.  No acute findings.

## 2013-08-05 ENCOUNTER — Telehealth: Payer: Self-pay | Admitting: Family Medicine

## 2013-08-05 DIAGNOSIS — M069 Rheumatoid arthritis, unspecified: Secondary | ICD-10-CM

## 2013-08-05 NOTE — Telephone Encounter (Signed)
Pt  Called and would like a referral to see Dr. Alben Deeds. She has been seeing him for over two years but now her insurance needs the referral from [place]. She see him for her arthritis. JW

## 2013-08-09 NOTE — Assessment & Plan Note (Signed)
Referral entered for her rheumatologist.

## 2013-08-10 NOTE — Telephone Encounter (Signed)
LVM for the new patient coordinator to call me back.Caroline Lewis

## 2013-08-11 NOTE — Telephone Encounter (Signed)
LVM once again for new patient coordinator to call back

## 2013-08-17 ENCOUNTER — Telehealth: Payer: Self-pay | Admitting: Family Medicine

## 2013-08-17 NOTE — Telephone Encounter (Signed)
Referral faxed. Zoeann Mol, Virgel Bouquet

## 2013-08-17 NOTE — Telephone Encounter (Signed)
Patient calls stating that [facility] has not paid for 3 of her visit to her Rheumatologist. Rep from [facility] told her that if Dr. Leveda Anna were to sent a referral to [facility], the insurance would pay. Please fax referral to [phone].

## 2013-08-21 IMAGING — CR DG RIBS W/ CHEST 3+V*L*
4 series · 4 of 4 positions shown · non-contrast
Comparison: 09/05/2011

CLINICAL DATA: Left-sided chest pain and anterior rib pain, no
trauma

LEFT RIBS AND CHEST - 3+ VIEW

[view not recorded (1 of 4)]
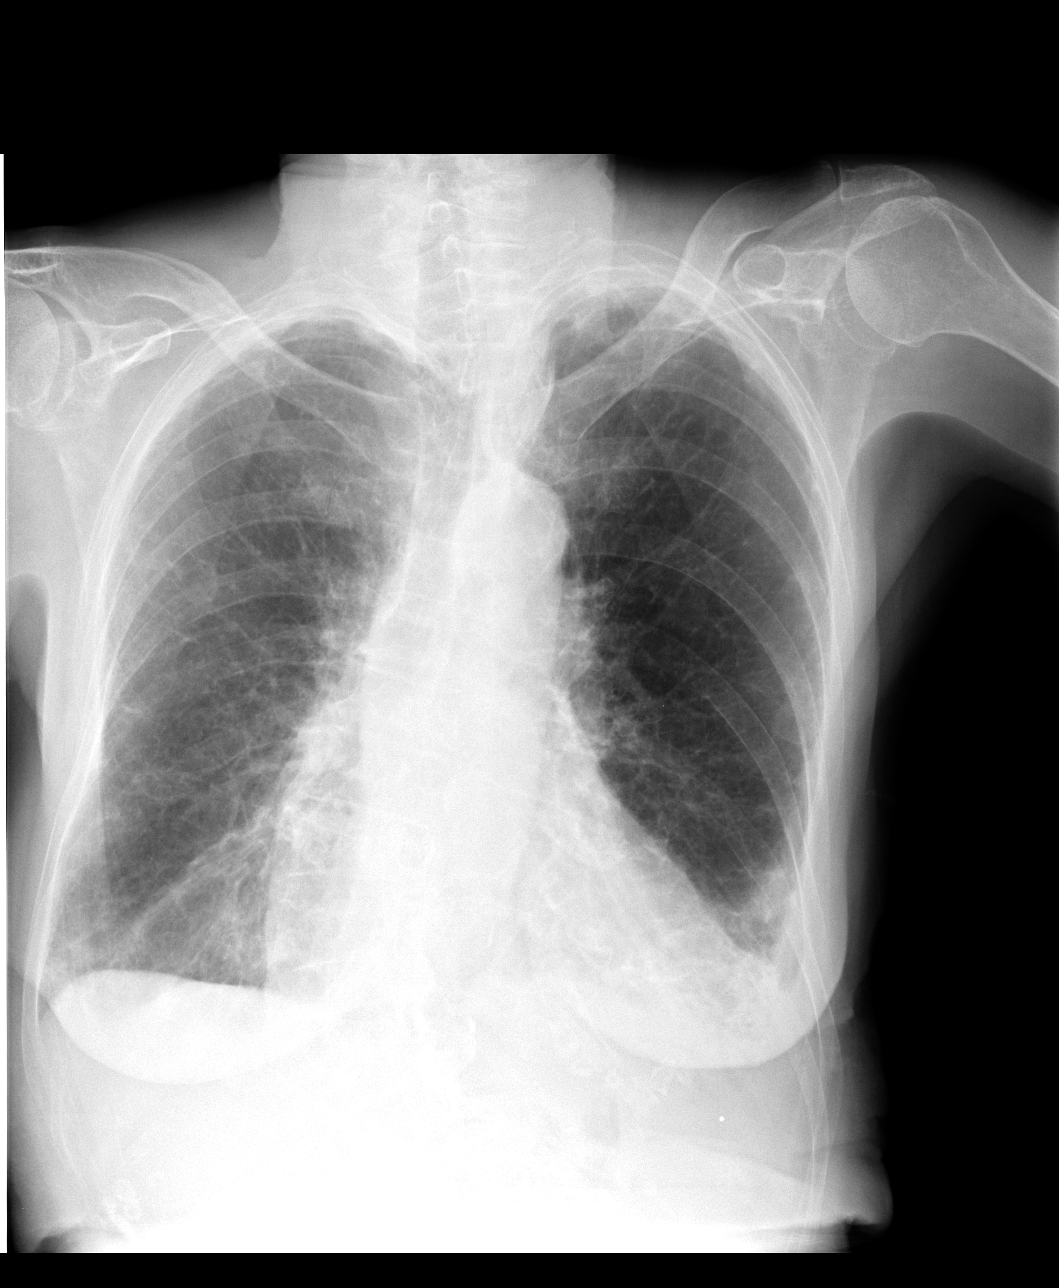

[view not recorded (2 of 4)]
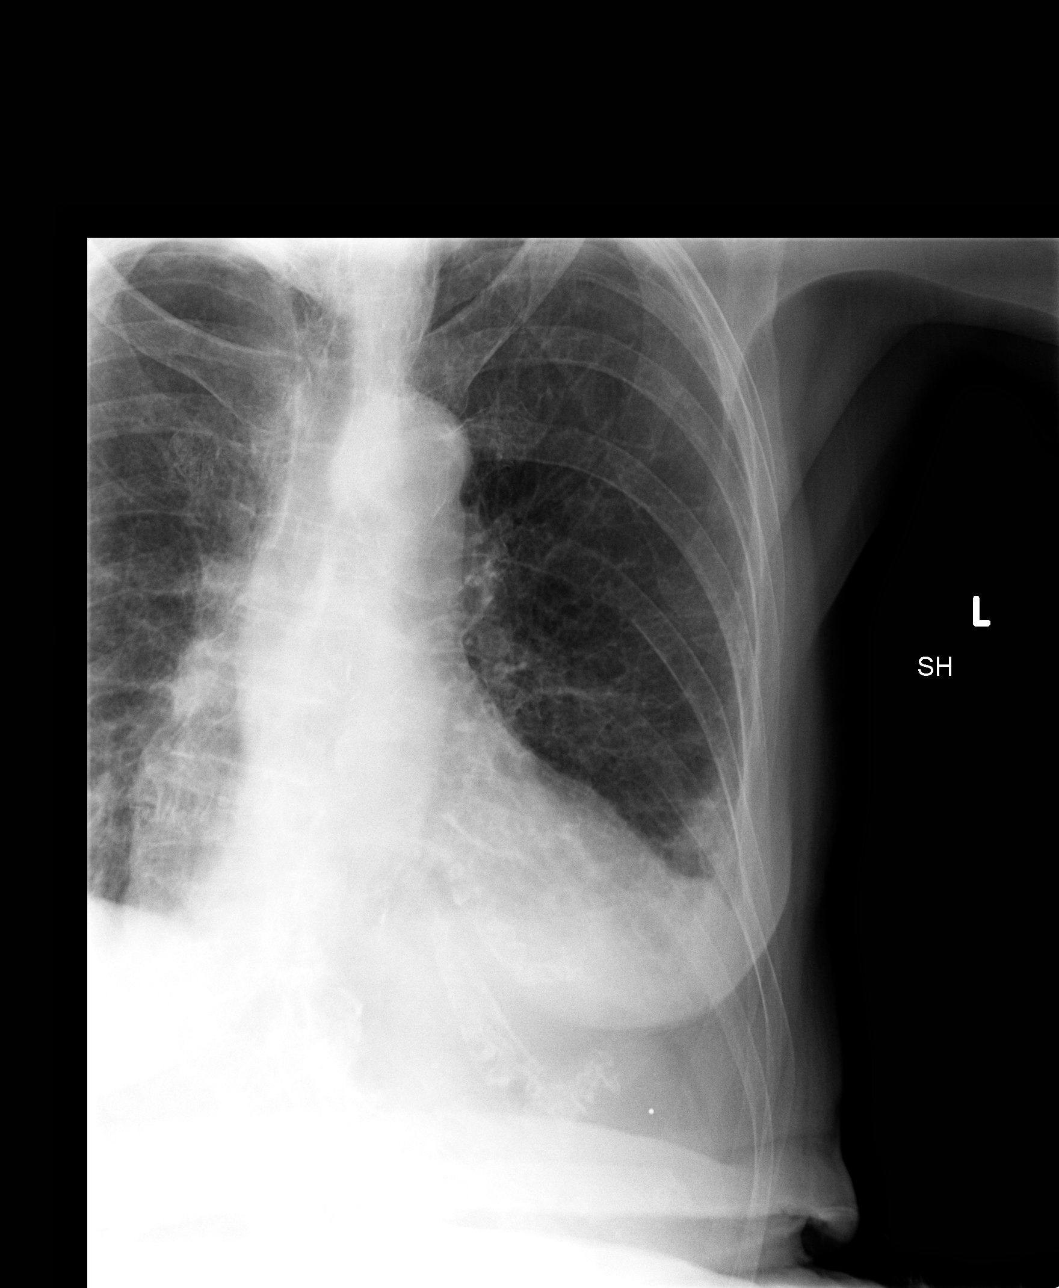

[view not recorded (3 of 4)]
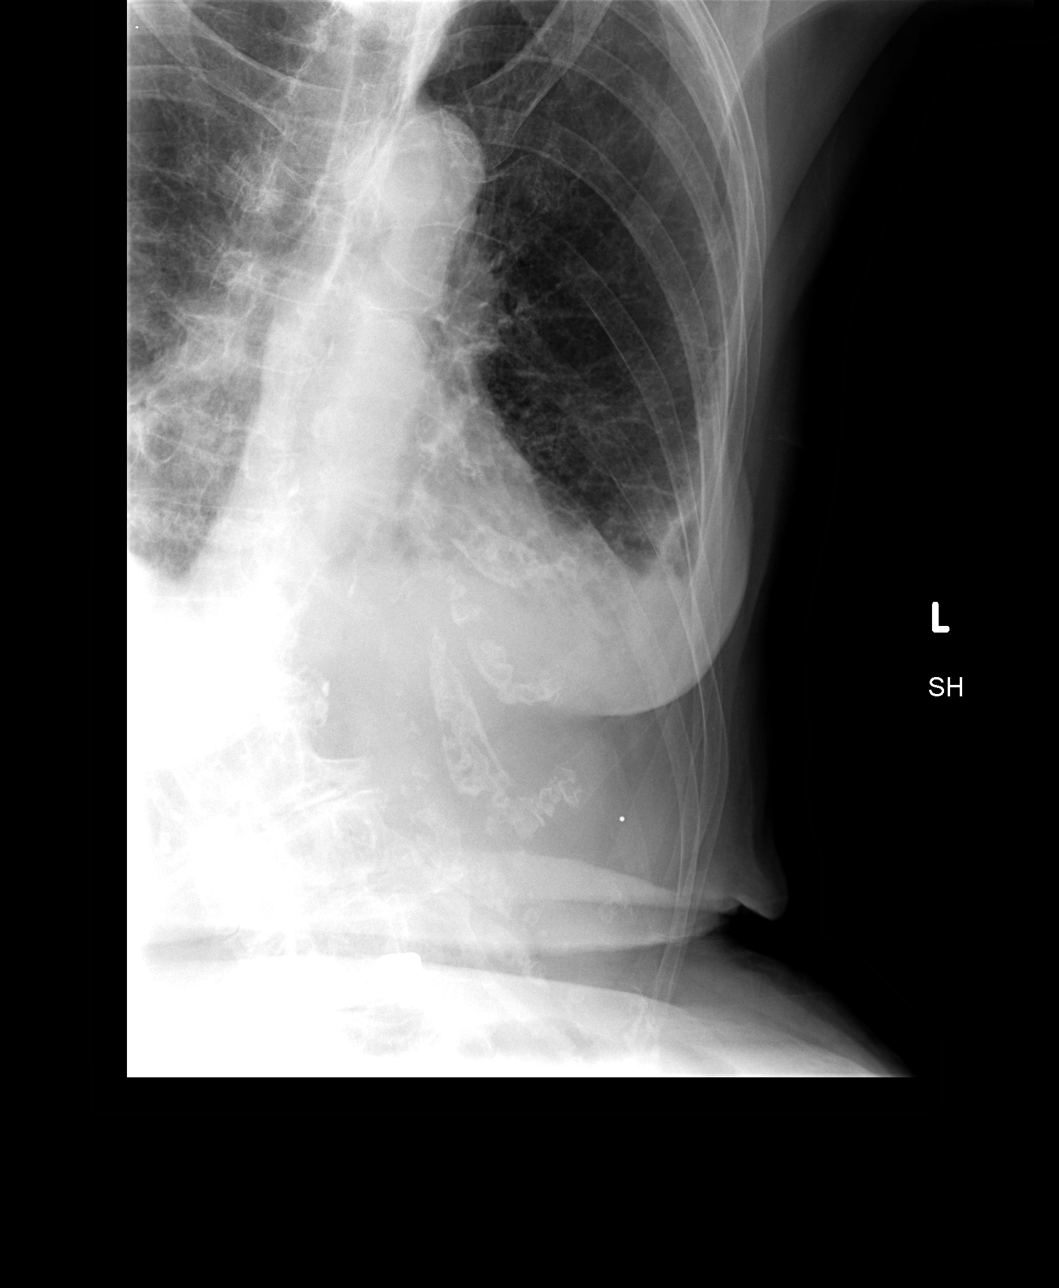

[view not recorded (4 of 4)]
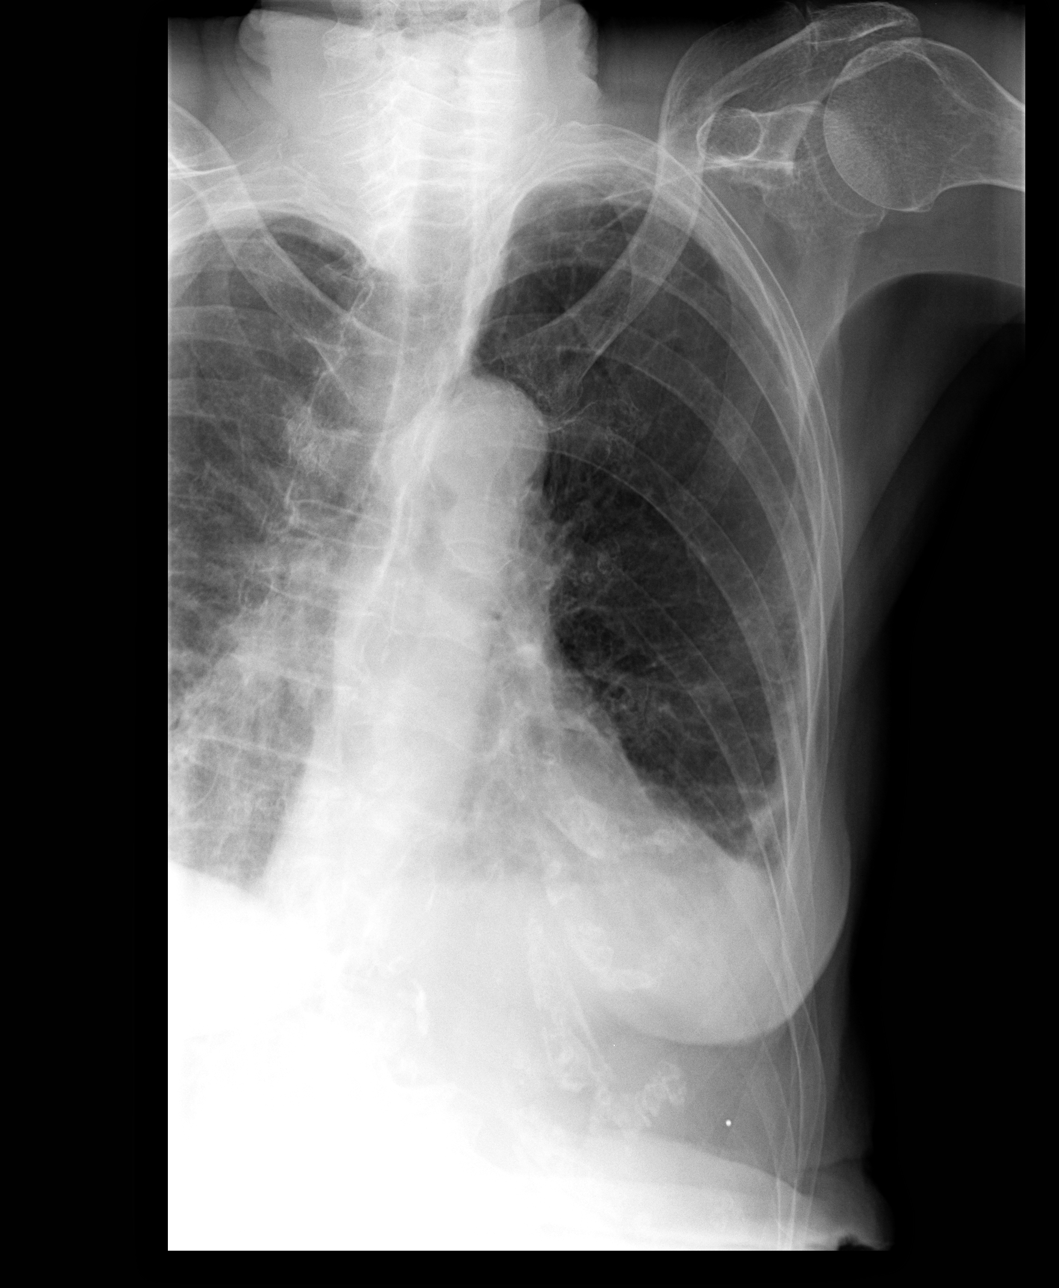

[4 of 4 positions shown; findings below may reference images not displayed]

FINDINGS: Lower thoracic vertebral body compression deformity again
noted but not well seen due to technique.  Chronically prominent
interstitial markings are noted.  There is superimposed new patchy
left lower lobe airspace disease and small left pleural effusion.
Lungs are hyper inflated suggestive of COPD.  No displaced rib
fracture is identified.  Heart size is normal.  No pneumothorax.
IMPRESSION: New patchy left lower lobe airspace opacity and small left
effusion.  This could represent pneumonia given the history of
chest pain.  Other alveolar filling processes would be less likely
given the absence of trauma.  No displaced rib fracture.

## 2013-09-23 ENCOUNTER — Telehealth: Payer: Self-pay | Admitting: Family Medicine

## 2013-09-23 NOTE — Telephone Encounter (Signed)
Pt called because she needs the doctor to do a referral for the insurance company to file her claim. It is for [facility] their number is [phone]. jw

## 2013-09-23 NOTE — Telephone Encounter (Signed)
LVM for patient to call back. I am needing to find out what type/kind of referral is requested and why

## 2013-10-14 ENCOUNTER — Ambulatory Visit (HOSPITAL_COMMUNITY)
Admission: RE | Admit: 2013-10-14 | Discharge: 2013-10-14 | Disposition: A | Discharge status: Home / Self Care | Payer: Medicare HMO | Source: Ambulatory Visit | Attending: Family Medicine | Admitting: Family Medicine

## 2013-10-14 ENCOUNTER — Ambulatory Visit (INDEPENDENT_AMBULATORY_CARE_PROVIDER_SITE_OTHER): Payer: Medicare HMO | Admitting: Family Medicine

## 2013-10-14 VITALS — BP 120/68 | HR 90 | Ht 60.0 in | Wt 108.9 lb

## 2013-10-14 DIAGNOSIS — J189 Pneumonia, unspecified organism: Secondary | ICD-10-CM

## 2013-10-14 DIAGNOSIS — N3941 Urge incontinence: Secondary | ICD-10-CM

## 2013-10-14 DIAGNOSIS — R059 Cough, unspecified: Secondary | ICD-10-CM | POA: Insufficient documentation

## 2013-10-14 DIAGNOSIS — R05 Cough: Secondary | ICD-10-CM | POA: Insufficient documentation

## 2013-10-14 MED ORDER — FESOTERODINE FUMARATE ER 8 MG PO TB24: 8.0000 mg | ORAL_TABLET | Freq: Every day | ORAL | Status: DC

## 2013-10-14 MED ORDER — LEVOFLOXACIN 500 MG PO TABS: 500.0000 mg | ORAL_TABLET | Freq: Every day | ORAL | Status: DC

## 2013-10-14 NOTE — Assessment & Plan Note (Signed)
Likely CAP given productive cough and crackles on exam, some evidence of COPD on past xrays - will treat with levaquin in this elderly/high risk lady - CXR today and call with results - PFTs with Koval when acute illness resolves

## 2013-10-14 NOTE — Assessment & Plan Note (Signed)
Patient complains of continuing symptoms and is insistent on trying another agent today - trial of Toviaz today, follow up with Hensel for further management

## 2013-10-14 NOTE — Patient Instructions (Addendum)
Please go to the hospital when you leave here for your chest xray.  I sent in a prescription for an antibiotic for pneumonia that you will need to take daily for a week  For your incontinence I sent in Toviaz to try. You should discuss this issue further at your next visit with Dr. Leveda Anna.  Please follow up with [place] in 1 month or sooner if you keep coughing or develop wheezing or feel short of breath.  Please also make an appointment in 1-2 weeks to see Dr. Raymondo Band here in our office for lung tests.  Thank you,  Dr. Richarda Blade

## 2013-10-14 NOTE — Progress Notes (Signed)
  Subjective:    Patient ID: Caroline Lewis, female    DOB: Mar 09, 1924, 79 y.o.   MRN: [id number]  HPI Pt present with 2 weeks of productive cough. Her son also has a cough that started after hers. Sputum is variably clear, yellow or green. Endorses minimal rhinorrhea, no sore throat, congestion. Does have mild eye aching but no frank sinus pressure.   Review of Systems  Constitutional: Negative for fever, chills and fatigue.  HENT: Positive for rhinorrhea. Negative for congestion, postnasal drip, sinus pressure, sneezing and sore throat.   Eyes: Positive for pain. Negative for discharge and itching.  Respiratory: Positive for cough. Negative for chest tightness, shortness of breath and wheezing.   Cardiovascular: Negative for chest pain and palpitations.  Gastrointestinal: Negative for abdominal pain.  Neurological: Negative for dizziness, syncope, weakness and light-headedness.  Psychiatric/Behavioral: Negative for confusion.  All other systems reviewed and are negative.       Objective:   Physical Exam  Nursing note and vitals reviewed. Constitutional: She is oriented to person, place, and time. She appears well-developed and well-nourished. No distress.  HENT:  Head: Normocephalic and atraumatic.  Right Ear: External ear normal.  Left Ear: External ear normal.  Nose: Nose normal.  Eyes: Conjunctivae are normal. Right eye exhibits no discharge. Left eye exhibits no discharge. No scleral icterus.  Neck: Normal range of motion. No JVD present. No tracheal deviation present.  Cardiovascular: Normal rate, regular rhythm, normal heart sounds and intact distal pulses.   No murmur heard. Pulmonary/Chest: Effort normal. No accessory muscle usage or stridor. Not tachypneic and not bradypneic. No respiratory distress. She has no wheezes.  Bilateral crackles in all lung fields, worst in bases  Abdominal: Soft. She exhibits no distension. There is no tenderness.  Lymphadenopathy:   She has no cervical adenopathy.  Neurological: She is alert and oriented to person, place, and time.  Skin: Skin is warm and dry. She is not diaphoretic.  Psychiatric: She has a normal mood and affect. Her behavior is normal.          Assessment & Plan:

## 2013-11-22 ENCOUNTER — Telehealth: Payer: Self-pay | Admitting: Family Medicine

## 2013-11-22 DIAGNOSIS — N3941 Urge incontinence: Secondary | ICD-10-CM

## 2013-11-22 MED ORDER — FESOTERODINE FUMARATE ER 8 MG PO TB24: 8.0000 mg | ORAL_TABLET | Freq: Every day | ORAL | Status: DC

## 2013-11-22 NOTE — Telephone Encounter (Signed)
Refilled med and informed patient.

## 2013-11-22 NOTE — Telephone Encounter (Signed)
Pt called because she is out of Toviaz and the pharmacy stated that she had to call us for approval. jw

## 2014-02-04 ENCOUNTER — Telehealth: Payer: Self-pay

## 2014-02-04 NOTE — Telephone Encounter (Signed)
lvm for pt to call back to see if she has received a flu shot and if so where. If not is pt interested. Please schedule if so. ms

## 2014-03-25 ENCOUNTER — Telehealth: Payer: Self-pay | Admitting: *Deleted

## 2014-03-25 DIAGNOSIS — M545 Low back pain, unspecified: Secondary | ICD-10-CM

## 2014-03-25 NOTE — Addendum Note (Signed)
Addended by: Zenia Resides on: 03/25/2014 11:44 AM   Modules accepted: Orders

## 2014-03-25 NOTE — Telephone Encounter (Signed)
Received message from Horatio Pel from [place] stating that pt has [employer] Gold Plus and one of the doctors at [facility] want to give pt supartz injections once a week for five weeks.  A silver back referral needs to put in the system.  If questions regarding this matter please give her a call at [phone].  Derl Barrow, RN

## 2014-03-26 ENCOUNTER — Other Ambulatory Visit: Payer: Self-pay | Admitting: Family Medicine

## 2014-03-30 ENCOUNTER — Other Ambulatory Visit: Payer: Self-pay | Admitting: *Deleted

## 2014-03-30 DIAGNOSIS — N3941 Urge incontinence: Secondary | ICD-10-CM

## 2014-03-31 MED ORDER — FESOTERODINE FUMARATE ER 8 MG PO TB24: 8.0000 mg | ORAL_TABLET | Freq: Every day | ORAL | Status: DC

## 2014-04-08 ENCOUNTER — Encounter: Payer: Self-pay | Admitting: Family Medicine

## 2014-04-08 ENCOUNTER — Ambulatory Visit (INDEPENDENT_AMBULATORY_CARE_PROVIDER_SITE_OTHER): Payer: Commercial Managed Care - HMO | Admitting: Family Medicine

## 2014-04-08 VITALS — BP 138/73 | HR 82 | Temp 98.1°F | Ht 60.0 in | Wt 113.8 lb

## 2014-04-08 DIAGNOSIS — Z9181 History of falling: Secondary | ICD-10-CM | POA: Insufficient documentation

## 2014-04-08 DIAGNOSIS — R1013 Epigastric pain: Secondary | ICD-10-CM

## 2014-04-08 DIAGNOSIS — M545 Low back pain, unspecified: Secondary | ICD-10-CM

## 2014-04-08 DIAGNOSIS — H16139 Photokeratitis, unspecified eye: Secondary | ICD-10-CM

## 2014-04-08 DIAGNOSIS — K3189 Other diseases of stomach and duodenum: Secondary | ICD-10-CM

## 2014-04-08 DIAGNOSIS — K219 Gastro-esophageal reflux disease without esophagitis: Secondary | ICD-10-CM | POA: Insufficient documentation

## 2014-04-08 MED ORDER — FAMOTIDINE 20 MG PO TABS: 20.0000 mg | ORAL_TABLET | Freq: Two times a day (BID) | ORAL | Status: DC

## 2014-04-08 MED ORDER — HYDROCODONE-ACETAMINOPHEN 7.5-325 MG PO TABS: 1.0000 | ORAL_TABLET | Freq: Four times a day (QID) | ORAL | Status: DC | PRN

## 2014-04-08 NOTE — Patient Instructions (Addendum)
Call your rheumatologist and ask about blood work and see if he can send a copy of those results to me.   Remember the leg strengthening exercisies we discussed. Let me know if you want me to set you up for some physical therapy. I sent in a prescription for indigestion medicine that you take once daily.   Be extra careful to avoid falls.

## 2014-04-08 NOTE — Assessment & Plan Note (Signed)
Multiple frozen 

## 2014-04-08 NOTE — Assessment & Plan Note (Signed)
Empiric pepcid

## 2014-04-08 NOTE — Assessment & Plan Note (Signed)
And right knee.  Chronic and progressive.

## 2014-04-08 NOTE — Progress Notes (Signed)
   Subjective:    Patient ID: Caroline Lewis, female    DOB: [birth year], 80 y.o.   MRN: [id number]  HPI Multiple issues Chronic back pain - stable on pain meds.  Needs refill Right knee pain.  Will get injections per ortho.  She has had a left knee replacement.  She knows well what it entails.  She and ortho agree, no right knee replacement. Skin problems on back.  Has had multiple AKs in the past. High risk falls.  Nearly fallen.  Using wheel chair more often.  I am worried wheel chair use will cause quad strength to worsen more quickly. Indigestion.  No bleeding.  She has nicely gained wt. Labs recently done by rheum.  Will try to get copy.    Review of Systems     Objective:   Physical ExamMultiple AKs on back frozen Abd benign Ext Right knee crepitus with ROM        Assessment & Plan:

## 2014-04-08 NOTE — Assessment & Plan Note (Signed)
Recommend strengthening.  Given exercises.  She will consider PT referral.  Wants to get knee injections from ortho first.

## 2014-04-19 NOTE — Telephone Encounter (Signed)
Caroline Lewis from [place] requested directly the referral to [facility] and it was approved.  [place]

## 2014-06-01 ENCOUNTER — Ambulatory Visit: Payer: Medicare HMO | Admitting: Family Medicine

## 2014-07-01 ENCOUNTER — Ambulatory Visit (INDEPENDENT_AMBULATORY_CARE_PROVIDER_SITE_OTHER): Payer: Commercial Managed Care - HMO | Admitting: Family Medicine

## 2014-07-01 ENCOUNTER — Encounter: Payer: Self-pay | Admitting: Family Medicine

## 2014-07-01 VITALS — BP 122/64 | HR 96 | Temp 98.3°F | Ht 60.0 in | Wt 104.1 lb

## 2014-07-01 DIAGNOSIS — K589 Irritable bowel syndrome without diarrhea: Secondary | ICD-10-CM

## 2014-07-01 DIAGNOSIS — M069 Rheumatoid arthritis, unspecified: Secondary | ICD-10-CM

## 2014-07-01 DIAGNOSIS — K219 Gastro-esophageal reflux disease without esophagitis: Secondary | ICD-10-CM

## 2014-07-01 DIAGNOSIS — M545 Low back pain, unspecified: Secondary | ICD-10-CM

## 2014-07-01 DIAGNOSIS — N3941 Urge incontinence: Secondary | ICD-10-CM

## 2014-07-01 DIAGNOSIS — M1711 Unilateral primary osteoarthritis, right knee: Secondary | ICD-10-CM

## 2014-07-01 DIAGNOSIS — E46 Unspecified protein-calorie malnutrition: Secondary | ICD-10-CM

## 2014-07-01 DIAGNOSIS — M1710 Unilateral primary osteoarthritis, unspecified knee: Secondary | ICD-10-CM

## 2014-07-01 DIAGNOSIS — Z23 Encounter for immunization: Secondary | ICD-10-CM

## 2014-07-01 MED ORDER — FAMOTIDINE 20 MG PO TABS: 20.0000 mg | ORAL_TABLET | Freq: Two times a day (BID) | ORAL | Status: DC

## 2014-07-01 MED ORDER — HYDROCODONE-ACETAMINOPHEN 7.5-325 MG PO TABS: 1.0000 | ORAL_TABLET | Freq: Four times a day (QID) | ORAL | Status: DC | PRN

## 2014-07-01 NOTE — Addendum Note (Signed)
Addended by: Johny Shears on: 07/01/2014 01:36 PM   Modules accepted: Orders

## 2014-07-01 NOTE — Assessment & Plan Note (Signed)
Given info about fodmaps.

## 2014-07-01 NOTE — Assessment & Plan Note (Signed)
Worse, increase famotidine.

## 2014-07-01 NOTE — Assessment & Plan Note (Signed)
Worse per patient.  No change for now.

## 2014-07-01 NOTE — Patient Instructions (Addendum)
I suggest that you stop the methotrexate and see what happens.  I suspect you will be fine. Also OK to stop the folic acid.  If we end up restarting the methotrexate, you will also need to restart the folic acid. Look up FODMAP, low fodmap diet on google. For irritable bowel syndrome. I want you to start taking the famotidine twice a day, every day.  Let me know if the hoarseness continues.  If it does, I will send you to the ENT doc. I would leave the back and knee alone if the pain has settled down.  If the pain comes back, the key will be to try to figure out how much of the pain is coming from the knee and how much is referred pain from the back.

## 2014-07-01 NOTE — Progress Notes (Signed)
   Subjective:    Patient ID: Caroline Lewis, female    DOB: 12/22/1923, 80 y.o.   MRN: [id number]  HPI  Lots of issues: 1. Refill chronic pain med due.  Helps her with her limited mobility 2. Had bad spell of pain radiating to right knee.  Has bone on bone osteoarthritis of knee plus a terrible looking back.  Fortunately, pain is OK at this point. 3. Morning hoarseness for several weeks.  Smoking history is very remote.  Some indigestion.  Nighttime suggests reflux is the major issue. 4. IBS, a longtime diagnosis, has flaired.  Wanted to know about foods.  Discussed FODMAPS 5. Can she stop her methotrexate?  Given for rheumatoid.   6. Needs prevnar 7. Urge incontinence is worse.  She is on max dose of toviaz    Review of Systems     Objective:   Physical Exam  Lungs clear Neck no swelling or adenopathy Cardiac RRR with 1/6 SEM Abd benign        Assessment & Plan:

## 2014-07-01 NOTE — Assessment & Plan Note (Signed)
Reiterated my concern about weight.  Especially, if she is going on an elimiation diet.  She will focus on maintaining calories.

## 2014-07-01 NOTE — Assessment & Plan Note (Signed)
Stop methotrexate and folic acid.  See how symptoms evolve.  I think risk benefit ratio clearly favors trial off meds.

## 2014-07-01 NOTE — Assessment & Plan Note (Signed)
I would not rush into knee replacement surgery.  Discussed pros and cons.

## 2014-08-09 ENCOUNTER — Telehealth: Payer: Self-pay | Admitting: Family Medicine

## 2014-08-09 NOTE — Telephone Encounter (Signed)
Fine with me.  She has severe OA and I welcome ortho's input.  I am not sure of the logistics of this.  I can easily put an order in Epic.  I can also give a verbal authorization for the referral.  Obviously, we will not be making any appointments for her.

## 2014-08-09 NOTE — Telephone Encounter (Signed)
Would like a referral to an orthapadic dr in [place] She is in [place] for 6 months Please advise

## 2014-08-11 NOTE — Telephone Encounter (Signed)
Mrs. Cathy called back to ask for referral to Dr. Clovis Cao be faxed to [phone] as soon as possible.  Please inform her when said referral has been made.

## 2014-08-15 ENCOUNTER — Other Ambulatory Visit: Payer: Self-pay | Admitting: Family Medicine

## 2014-08-15 DIAGNOSIS — M545 Low back pain, unspecified: Secondary | ICD-10-CM

## 2014-08-16 NOTE — Telephone Encounter (Signed)
Called again about her referral. Wants to be contacted when referral is done

## 2014-08-26 NOTE — Telephone Encounter (Signed)
Spoke with patient and informed her that referral faxed out

## 2014-08-26 NOTE — Telephone Encounter (Signed)
Pt calling about the referral. Would like to know the status, pls advise patient.

## 2014-08-29 ENCOUNTER — Other Ambulatory Visit: Payer: Self-pay | Admitting: Family Medicine

## 2014-08-29 ENCOUNTER — Telehealth: Payer: Self-pay | Admitting: Family Medicine

## 2014-08-29 DIAGNOSIS — M1711 Unilateral primary osteoarthritis, right knee: Secondary | ICD-10-CM

## 2014-08-29 NOTE — Telephone Encounter (Signed)
Daughter called. The referral to the orthopeatic stated it was for back pain.  Her pain is knee pain.  Please correct and resend

## 2014-09-23 ENCOUNTER — Ambulatory Visit: Payer: Commercial Managed Care - HMO | Admitting: Family Medicine

## 2014-09-28 ENCOUNTER — Ambulatory Visit (INDEPENDENT_AMBULATORY_CARE_PROVIDER_SITE_OTHER): Payer: Commercial Managed Care - HMO | Admitting: Family Medicine

## 2014-09-28 ENCOUNTER — Encounter: Payer: Self-pay | Admitting: Family Medicine

## 2014-09-28 VITALS — BP 134/65 | HR 93 | Temp 98.1°F | Ht 60.0 in | Wt 108.1 lb

## 2014-09-28 DIAGNOSIS — E46 Unspecified protein-calorie malnutrition: Secondary | ICD-10-CM

## 2014-09-28 DIAGNOSIS — M1711 Unilateral primary osteoarthritis, right knee: Secondary | ICD-10-CM

## 2014-09-28 DIAGNOSIS — L821 Other seborrheic keratosis: Secondary | ICD-10-CM

## 2014-09-28 DIAGNOSIS — M545 Low back pain: Secondary | ICD-10-CM

## 2014-09-28 MED ORDER — HYDROCODONE-ACETAMINOPHEN 7.5-325 MG PO TABS: 1.0000 | ORAL_TABLET | Freq: Four times a day (QID) | ORAL | Status: DC | PRN

## 2014-09-28 NOTE — Patient Instructions (Signed)
The question of right knee replacement surgery does not have a right or wrong answer.   You have choices with risk each way.  Given you age, osteoporosis and the difficulty of rehabilitation,  I think the safest choice is treatment with pain meds, not surgery. You will have pain with transfers no matter what.  If I can keep your pain under control for the rest of the time, we are doing good. Your skin problems are seborrheic keratoses.   I gave you your pain prescription. The skin areas that I froze with blister, fall off and then heal normally. See me in three months. I strongly recommend a flu shot.

## 2014-09-28 NOTE — Progress Notes (Signed)
   Subjective:    Patient ID: Caroline Lewis, female    DOB: December 03, 1923, 80 y.o.   MRN: [id number]  HPI Refuses flu shot Mult issues Considering knee replacement surg.  She currently lives a bed to chair existence.  Only does transfers and walks a few steps.  Knee pain is one limiting factor.  Back pain and general weakness are others.  Also has profound osteoporosis which make the surgery more risky.  Multiple skin lesions she wants frozen. Needs prescription for her pain medication.      Review of Systems     Objective:   Physical Exam Multiple seb k on back and chest frozen.   Rt knee crepitus and pain with movement.  She is likely correct when she says she has bone on bone joint.        Assessment & Plan:

## 2014-09-29 DIAGNOSIS — L821 Other seborrheic keratosis: Secondary | ICD-10-CM | POA: Insufficient documentation

## 2014-09-29 NOTE — Assessment & Plan Note (Signed)
Improved weight and general appearance.

## 2014-09-29 NOTE — Assessment & Plan Note (Signed)
Multiple frozen 

## 2014-09-29 NOTE — Assessment & Plan Note (Signed)
I have discouraged surgery.  I don't think she would improve her functional status.  Her pain might improve - but only after a prolonged recovery period fraught with many potential complications.

## 2014-10-07 ENCOUNTER — Ambulatory Visit: Payer: Commercial Managed Care - HMO | Admitting: Family Medicine

## 2014-10-27 ENCOUNTER — Telehealth: Payer: Self-pay | Admitting: Family Medicine

## 2014-10-27 NOTE — Telephone Encounter (Signed)
Daughter called and wanted to know the status of her mother's referral to Dr. Abigail Miyamoto in [place]. She called [employer] and they said that we have not requested this yet. Can we process this daughter said mother has been waiting for over three months. jw

## 2014-11-10 ENCOUNTER — Ambulatory Visit: Payer: Medicare HMO | Admitting: Physical Therapy

## 2014-11-11 ENCOUNTER — Encounter: Payer: Self-pay | Admitting: Family Medicine

## 2014-11-11 ENCOUNTER — Ambulatory Visit (INDEPENDENT_AMBULATORY_CARE_PROVIDER_SITE_OTHER): Payer: Commercial Managed Care - HMO | Admitting: Family Medicine

## 2014-11-11 VITALS — BP 117/72 | HR 89 | Temp 98.4°F | Ht 60.0 in | Wt 100.0 lb

## 2014-11-11 DIAGNOSIS — Z7189 Other specified counseling: Secondary | ICD-10-CM

## 2014-11-11 DIAGNOSIS — G8929 Other chronic pain: Secondary | ICD-10-CM

## 2014-11-11 MED ORDER — HYDROCODONE-ACETAMINOPHEN 10-325 MG PO TABS: 1.0000 | ORAL_TABLET | Freq: Three times a day (TID) | ORAL | Status: DC | PRN

## 2014-11-11 NOTE — Assessment & Plan Note (Signed)
Will refill.

## 2014-11-11 NOTE — Patient Instructions (Signed)
I will see you after you get back from [place]. Thank you for signing the pain contract I hope irrigating your ear helps. I will work on the referral.

## 2014-11-11 NOTE — Progress Notes (Signed)
   Subjective:    Patient ID: Caroline Lewis, female    DOB: 1924/05/15, 80 y.o.   MRN: [id number]  HPI In donut hole.  Wants alternative to [employer] and then says nothing else works.  No change. Chronic right ear deafness.  Complains of recent fullness Wants increase in her pain meds.  I did increase from 7.5 to 10 hydrocodone.  Signed pain contract.      Review of Systems     Objective:   Physical Exam Cerumen impaction right ear, irrigated clear Lungs clear Cardiac RRR without m or g       Assessment & Plan:

## 2014-11-15 ENCOUNTER — Other Ambulatory Visit: Payer: Self-pay | Admitting: Family Medicine

## 2014-11-15 DIAGNOSIS — M1711 Unilateral primary osteoarthritis, right knee: Secondary | ICD-10-CM

## 2014-11-15 NOTE — Assessment & Plan Note (Signed)
At the last office visit, Caroline Lewis and her son asked me to reorder an ortho referral to evaluate options for treatment of her knee pain.  Also, gave me an e mail from [facility] [place], Nicanor Bake in [facility].  We have previously tried to order x 2 and the referral does not make its way.  I suspect the problem is that they want an out of network physician in [place] to handle.  Clearly, Caroline Lewis meets criteria for an orthopedic referral.  I do not know if [employer] has higher criteria or special requirements for such long distance referrals. Daughter and patient want the [place] physician because daughter could better help care for mom in the recovery period of any surgery.   Contact numbers: Orthopedist is [place] Dr.  Clovis Cao [phone]  [place] [address] [phone] Fax [phone]  [employer] clinical intake department [phone]  Daughter in [place] [phone]

## 2014-11-28 ENCOUNTER — Other Ambulatory Visit: Payer: Self-pay | Admitting: Family Medicine

## 2014-12-07 ENCOUNTER — Ambulatory Visit: Payer: Commercial Managed Care - HMO | Admitting: Family Medicine

## 2014-12-14 ENCOUNTER — Encounter: Payer: Self-pay | Admitting: Family Medicine

## 2014-12-14 NOTE — Progress Notes (Signed)
Patient ID: Caroline Lewis Lewis, female   DOB: 01-Mar-1924, 81 y.o.   MRN: [id number] Continued trouble with the [place] referral.  Here is a copy of my e mail correspondence.  Ms. Caroline Lewis Lewis, Let me start by apologizing - it should not take this long to get your mom seen.  Then let me add, the problem has been with [employer].  My staff has spent hours trying to make this happen.  My understanding is that we did get the referral put in for Dr. Esmeralda Arthur.  I believe we have made progress.  Yesterday, we received paperwork from Dr. Boston Service office asking that we submit additional paperwork for approval by [facility].  We have completed that paperwork, submitted it to [place] is reviewing that additional paperwork now.  I hope that your mom will see Dr. Esmeralda Arthur soon.  It should not be this hard to make a referral.  We, too, are frustrated by the process.  Caroline A. Caroline Frames, MD Director, [facility] Direct Office # [phone] Page # [phone] Mobile # [phone]  From: [facility] [[email or web address]]  Sent: Tuesday, December 13, 2014 12:33 PM To: Caroline Lewis, Caroline Lewis: Caroline Lewis Lewis Subject: RE: Caroline Lewis Lewis referral  Dr Caroline Lewis Lewis,  Thank you so much for your efforts on behalf of [name], Caroline Lewis Lewis.  This is to update you on the ongoing situation.  It is still not resolved and the weeks just keep going by with [name] still not able to see the orthopedic specialist.  I received a call from your office January 5th or 6th.  They suggested the problem might be that the doctor we were trying to see in [place] (Dr Caroline Lewis Lewis in [facility])  is not an Surveyor, quantity.  She also suggested the name of a doctor in the same area who is in-network.  We agreed to change to him, Doctor Caroline Lewis Lewis in [place].  I called Dr Boston Service office Friday, January 15th to see if they had received anything regarding the referral.  I spoke with Debroah Baller in that office.  She said they  have received [name]'s medical records but have not received the referral from [facility].  Apparently there are certain codes they need and the codes must come from [facility] and not from the referring doctor's office.    I just this moment finished speaking with [employer] Clinical Intake.  They tell me they have not received any referral since back in 2014.  Again, their instructions for your office are to call [place] at [place].  And their fax number for your use is [phone].    It seems to me that something somewhere is not being done or there is an "I" that hasn't been dotted or a "t" crossed.  Again, [name] and I thank you for your help.   Caroline Lewis [place] [address] [phone]  From: [email or web address] To: [email or web address] Lewis: [email or web address] Subject: [place] referral Date: Tue, 15 Nov 2014 20:09:41 +0000 S[place] S[place], At her last visit, your mom gave me a copy of the e mail you sent.  I am sorry for all the troubles.  I know that we have tried to submit the referral on at least two occasions.  My office will try again.   One sentence in your e mail caught my attention: It was the one about [place] calling [employer] "and explain why the referral  was medically necessary."  That phrase might be the key to the hold up.  No one can argue that your mom would benefit from seeing an orthopedic surgeon.  She has terrible arthritis problems in her knees and elsewhere.  There is not much I can do as a family doc and I would welcome the help of the specialist.     Now let me channel a [employer] account representative and talk about in network versus out of network referrals.  [employer] likely contracts with local orthopedists (and hospitals, labs and X rays) and thus would end up paying less money to a local doc than to Dr. Abigail Lewis in [place].  I do not expect any challenge from them saying that she needs to see an  orthopedist: I do expect a challenge from them asking why she needs to see an orthopedist in [place].  Typically, such insurance companies only approve out of network referrals for procedures not available in network.  The typical response to you helping with the recovery is for you to support her in [place] rather than them paying more for the care in [place].  I'm not saying this is right.  I'm simply saying this is how they think and respond.   We will try again.  Any additional justification you can provide that would make the case that she does indeed NEED (not want but need) to receive her care in [place] would be greatly appreciated and improve the likelihood that the referral will be approved.     I do enjoy caring for your mom and will try to help with this request.    Caroline Lewis Lewis. Caroline Frames, MD Director, [facility] NOTICE: This message may contain confidential information intended only for the recipient.  If you have received this communication in error, please notify the sender immediately by replying to the message and deleting it from your computer.

## 2015-02-15 ENCOUNTER — Ambulatory Visit: Payer: Medicare HMO | Admitting: Family Medicine

## 2015-03-24 ENCOUNTER — Encounter: Payer: Self-pay | Admitting: Family Medicine

## 2015-03-24 ENCOUNTER — Ambulatory Visit (INDEPENDENT_AMBULATORY_CARE_PROVIDER_SITE_OTHER): Payer: Commercial Managed Care - HMO | Admitting: Family Medicine

## 2015-03-24 VITALS — BP 125/72 | HR 97 | Ht 60.0 in

## 2015-03-24 DIAGNOSIS — K5900 Constipation, unspecified: Secondary | ICD-10-CM

## 2015-03-24 DIAGNOSIS — Z7189 Other specified counseling: Secondary | ICD-10-CM

## 2015-03-24 DIAGNOSIS — R1310 Dysphagia, unspecified: Secondary | ICD-10-CM

## 2015-03-24 DIAGNOSIS — M1711 Unilateral primary osteoarthritis, right knee: Secondary | ICD-10-CM | POA: Diagnosis not present

## 2015-03-24 DIAGNOSIS — G8929 Other chronic pain: Secondary | ICD-10-CM

## 2015-03-24 DIAGNOSIS — N3941 Urge incontinence: Secondary | ICD-10-CM | POA: Diagnosis not present

## 2015-03-24 DIAGNOSIS — K219 Gastro-esophageal reflux disease without esophagitis: Secondary | ICD-10-CM

## 2015-03-24 MED ORDER — PREGABALIN 150 MG PO CAPS: 150.0000 mg | ORAL_CAPSULE | Freq: Two times a day (BID) | ORAL | Status: DC

## 2015-03-24 MED ORDER — FESOTERODINE FUMARATE ER 8 MG PO TB24: 8.0000 mg | ORAL_TABLET | Freq: Every day | ORAL | Status: DC

## 2015-03-24 MED ORDER — OMEPRAZOLE 40 MG PO CPDR: 40.0000 mg | DELAYED_RELEASE_CAPSULE | Freq: Every day | ORAL | Status: DC

## 2015-03-24 MED ORDER — HYDROCODONE-ACETAMINOPHEN 10-325 MG PO TABS: 1.0000 | ORAL_TABLET | Freq: Three times a day (TID) | ORAL | Status: DC | PRN

## 2015-03-24 NOTE — Assessment & Plan Note (Signed)
Patient reports a two month history of discomfort on swallowing solids. Possibly due to uncontrolled GERD.  - prescribed 40mg  omeprazole BID - return in 2-3 months to re-evaluate with possible referral to GI for an upper endoscopy to rule out other etiologies (motility disorders, esophagitis, carcinoma)  Dois Davenport, MS3

## 2015-03-24 NOTE — Patient Instructions (Signed)
Good to see you again. I started two new medications Lyrica is a nerve pain medicine. Omeprazole is a strong indigestion/acid reducer medicine See me in 2-3 months to decide what we need to next.

## 2015-03-24 NOTE — Assessment & Plan Note (Signed)
Patient reports using Miralax if she goes 2 days without a BM. Due to recent colonic pain post-eating, suggested that patient start a regular course of Miralax every other day.   Written by: Dois Davenport, MS3

## 2015-03-24 NOTE — Assessment & Plan Note (Addendum)
Pain in right knee that has gotten worse over the past few months with addition of radicular pain through posterior aspect of joint. Patient is unable to ambulate more than a few steps.  - Orthopedic surgery referral - Lyrica prescription - Refilled hydrocodone  Dois Davenport, MS3  Agree with above.

## 2015-03-24 NOTE — Progress Notes (Signed)
Subjective:     Patient ID: Caroline Lewis, female   DOB: September 17, 1924, 81 y.o.   MRN: [id number] Written by: Dois Davenport, MS3  HPI Mrs. Larios is an 81 year old female who presents today with her daughter for follow-up on various medical problems.  Ostearthritis: Patient reports that pain in her right knee has gotten worse over the past few months. She is taking Vicodin 1-3 times per day which provides short-term relief.  She requests an orthopedic surgery referral today. She also reports a new "electric shock" pain on the posterior aspect of her knee. It does not radiate past the knee.   Dysphagia: Patient reports a 2 month history of discomfort on swallowing solids. She does not report the problem with liquids. Sitting up straight and drinking water while eating improves symptoms. She has a history of GERD, but has not been taking Pepcid because it did not improve her symptoms. She denies epigastric pain or burning pain in her esophagus.  Constipation: She reports that Colace did not help her symptoms. She has been using Miralax as needed if she did not have BM in two days. She also notes colonic pain after eating, but attributes this to a return of her IBS.  Urinary Incontinence: Lisbeth Ply has significantly helped with the urinary incontinence. She describes one episode of incontinence in the past month. The medicine costs her $47/month and she is interested in discussing alternatives.    Review of Systems See HPI.    Objective:   Physical Exam  BP 125/72 mmHg  Pulse 97  Ht 5' (1.524 m) Gen: well-appearing in NAD CV: RRR, no MRG, nl S1 and S2 Pulmonary: CTA bilaterally, no crackles or wheezes Extremities: right knee with obvious bony enlargement and tenderness.     Assessment:     Please see Problem List.     Plan:     Please see Problem List.

## 2015-03-25 NOTE — Assessment & Plan Note (Signed)
Refill meds

## 2015-03-25 NOTE — Progress Notes (Signed)
   Subjective:    Patient ID: Caroline Lewis, female    DOB: 30-Jan-1924, 81 y.o.   MRN: [id number]  HPI  I personally repeated and/or was present for all elements of the history and PE by [facility].  Briefly, several issues: Pain med refill for chronic pain. Wants to go to ortho for consideration of hyaluronidase Right knee injections Known GERD, worse off PPI (substituted H2 blocker because of osteoporosis concern.)  Now some dysphagia Concerned of cost of toviaz.  Did not respond well to detrol    Review of Systems     Objective:   Physical Exam Confined to wheelchair Lungs clear Cardiac RRR without m or g Ext Rt knee bony enlargement, no effusion.  Lax ligaments lateral angulation of tibia.      Assessment & Plan:

## 2015-03-25 NOTE — Assessment & Plan Note (Signed)
After discussion, she will stick with Lisbeth Ply

## 2015-03-25 NOTE — Assessment & Plan Note (Signed)
Worse off PPI.  Restart.  Will need GI referral and endo if dysphagia continues on PPI.

## 2015-03-27 ENCOUNTER — Telehealth: Payer: Self-pay

## 2015-03-27 NOTE — Telephone Encounter (Signed)
LVM for patient to return call. Please advise patient that an appt has been scheduled with Dr. Dorna Leitz at [facility] for [place]. Mar 30, 2015 at 2:15pm.   Address: [address], [place], [place] [address] [phone]

## 2015-04-10 ENCOUNTER — Encounter: Payer: Self-pay | Admitting: Family Medicine

## 2015-04-10 NOTE — Progress Notes (Signed)
Patient called and left message on medical records line wanting a backdated referral done for when she was sent to see Dr. Amil Amen on 09/29/2014.  She says that she is getting billed because a referral was not placed and when they talked to the billing dept they suggested she call us to get a referral put in .  Please call her regarding this. [phone]

## 2015-04-11 NOTE — Progress Notes (Signed)
Spoke with patient and informed her that [employer] doesn't retropay as of 08/25/2014.  Pt voiced understanding and will let our office know when she needs another appt with Dr. Amil Amen so we can get a referral authorization first. [facility]

## 2015-05-11 ENCOUNTER — Telehealth: Payer: Self-pay | Admitting: Family Medicine

## 2015-05-11 NOTE — Telephone Encounter (Signed)
Will forward to MD to make him aware. Tracee Mccreery,CMA  

## 2015-05-11 NOTE — Telephone Encounter (Signed)
[name] the patient's daughter called to let the doctor know that her job will be sending over [place] papers for him to fill out about her mother. Caroline Lewis

## 2015-05-15 NOTE — Telephone Encounter (Signed)
Done

## 2015-06-16 ENCOUNTER — Ambulatory Visit (INDEPENDENT_AMBULATORY_CARE_PROVIDER_SITE_OTHER): Payer: Commercial Managed Care - HMO | Admitting: Family Medicine

## 2015-06-16 ENCOUNTER — Encounter: Payer: Self-pay | Admitting: Family Medicine

## 2015-06-16 VITALS — BP 109/60 | HR 97 | Temp 99.5°F | Ht 60.0 in | Wt 106.8 lb

## 2015-06-16 DIAGNOSIS — D638 Anemia in other chronic diseases classified elsewhere: Secondary | ICD-10-CM

## 2015-06-16 DIAGNOSIS — L821 Other seborrheic keratosis: Secondary | ICD-10-CM | POA: Diagnosis not present

## 2015-06-16 DIAGNOSIS — E46 Unspecified protein-calorie malnutrition: Secondary | ICD-10-CM | POA: Diagnosis not present

## 2015-06-16 DIAGNOSIS — M5441 Lumbago with sciatica, right side: Secondary | ICD-10-CM

## 2015-06-16 DIAGNOSIS — Z13220 Encounter for screening for lipoid disorders: Secondary | ICD-10-CM

## 2015-06-16 DIAGNOSIS — M5442 Lumbago with sciatica, left side: Secondary | ICD-10-CM

## 2015-06-16 DIAGNOSIS — M1711 Unilateral primary osteoarthritis, right knee: Secondary | ICD-10-CM

## 2015-06-16 LAB — COMPREHENSIVE METABOLIC PANEL
ALT: <8 U/L (ref 0–35)
AST: 13 U/L (ref 0–37)
Albumin: 3.4 g/dL — ABNORMAL LOW (ref 3.5–5.2)
Alkaline Phosphatase: 54 U/L (ref 39–117)
BUN: 10 mg/dL (ref 6–23)
CALCIUM: 8 mg/dL — AB (ref 8.4–10.5)
CHLORIDE: 102 meq/L (ref 96–112)
CO2: 28 mEq/L (ref 19–32)
Creat: 0.65 mg/dL (ref 0.50–1.10)
Glucose, Bld: 99 mg/dL (ref 70–99)
Potassium: 4.3 mEq/L (ref 3.5–5.3)
Sodium: 136 mEq/L (ref 135–145)
TOTAL PROTEIN: 6.1 g/dL (ref 6.0–8.3)
Total Bilirubin: 0.3 mg/dL (ref 0.2–1.2)

## 2015-06-16 MED ORDER — HYDROCODONE-ACETAMINOPHEN 10-325 MG PO TABS: 1.0000 | ORAL_TABLET | Freq: Three times a day (TID) | ORAL | Status: DC | PRN

## 2015-06-16 NOTE — Patient Instructions (Signed)
I will call Monday with blood test results See me in 3 months. Keep getting the calories in you. I am sorry about the cost of the lyrica.

## 2015-06-16 NOTE — Assessment & Plan Note (Signed)
Check cMP to make sure serum proteins OK.

## 2015-06-16 NOTE — Assessment & Plan Note (Signed)
Check labs 

## 2015-06-16 NOTE — Progress Notes (Signed)
   Subjective:    Patient ID: Caroline Lewis, female    DOB: May 18, 1924, 81 y.o.   MRN: [id number]  HPI  Here for refill of pain meds.  Tolerating OK.  Ambulation is now quite limited.  Uses wheelchair for everything. Also has irritated skin lesion on back that she wants frozen. Weight is low - but stable for her.    Review of Systems     Objective:   Physical Exam Lungs clear. Back kyphotic Skin froze an irritated seb K on her right scapular area.         Assessment & Plan:

## 2015-06-16 NOTE — Assessment & Plan Note (Signed)
Froze one that was symptomatic.

## 2015-06-16 NOTE — Assessment & Plan Note (Signed)
Recheck labs 

## 2015-06-16 NOTE — Assessment & Plan Note (Signed)
Refill chronic pain meds 

## 2015-06-17 LAB — CBC
HEMATOCRIT: 29.4 % — AB (ref 36.0–46.0)
Hemoglobin: 8.8 g/dL — ABNORMAL LOW (ref 12.0–15.0)
MCH: 25 pg — ABNORMAL LOW (ref 26.0–34.0)
MCHC: 29.9 g/dL — ABNORMAL LOW (ref 30.0–36.0)
MCV: 83.5 fL (ref 78.0–100.0)
MPV: 8.3 fL — AB (ref 8.6–12.4)
Platelets: 210 10*3/uL (ref 150–400)
RBC: 3.52 MIL/uL — ABNORMAL LOW (ref 3.87–5.11)
RDW: 24.4 % — AB (ref 11.5–15.5)
WBC: 6 10*3/uL (ref 4.0–10.5)

## 2015-06-21 ENCOUNTER — Telehealth: Payer: Self-pay | Admitting: Family Medicine

## 2015-06-21 NOTE — Telephone Encounter (Signed)
Pt called and would like a referral to see Dr. Berenice Primas again and this time about her stomach. jw

## 2015-06-27 MED ORDER — TOLTERODINE TARTRATE ER 2 MG PO CP24: 2.0000 mg | ORAL_CAPSULE | Freq: Every day | ORAL | Status: DC

## 2015-06-27 NOTE — Telephone Encounter (Signed)
Discussed.  Does not need ortho referral at this time. Would like to go back to detrol LA.  Cost is less and Toviaz "does not work that well"  Rx sent.

## 2015-07-05 ENCOUNTER — Telehealth: Payer: Self-pay | Admitting: Family Medicine

## 2015-07-05 DIAGNOSIS — M069 Rheumatoid arthritis, unspecified: Secondary | ICD-10-CM

## 2015-07-05 NOTE — Telephone Encounter (Signed)
Pt called because she needs a referral to Dr. Leigh Aurora so that she can get her medication from him. jw

## 2015-07-05 NOTE — Assessment & Plan Note (Signed)
Referral entered as requested.  

## 2015-07-05 NOTE — Telephone Encounter (Signed)
Referral entered  

## 2015-07-06 NOTE — Telephone Encounter (Signed)
Spoke with pt, informed her the referral has been sent and she should hear from someone in the next few days. Ottis Stain, CMA

## 2015-07-22 ENCOUNTER — Encounter (HOSPITAL_COMMUNITY): Payer: Self-pay

## 2015-07-22 ENCOUNTER — Emergency Department (HOSPITAL_COMMUNITY)
Admission: EM | Admit: 2015-07-22 | Discharge: 2015-07-22 | Disposition: A | Discharge status: Home / Self Care | Payer: Commercial Managed Care - HMO | Attending: Emergency Medicine | Admitting: Emergency Medicine

## 2015-07-22 ENCOUNTER — Emergency Department (HOSPITAL_COMMUNITY): Payer: Commercial Managed Care - HMO

## 2015-07-22 DIAGNOSIS — Z87891 Personal history of nicotine dependence: Secondary | ICD-10-CM | POA: Diagnosis not present

## 2015-07-22 DIAGNOSIS — R109 Unspecified abdominal pain: Secondary | ICD-10-CM | POA: Diagnosis present

## 2015-07-22 DIAGNOSIS — Z8701 Personal history of pneumonia (recurrent): Secondary | ICD-10-CM | POA: Insufficient documentation

## 2015-07-22 DIAGNOSIS — K5900 Constipation, unspecified: Secondary | ICD-10-CM | POA: Insufficient documentation

## 2015-07-22 DIAGNOSIS — R1110 Vomiting, unspecified: Secondary | ICD-10-CM

## 2015-07-22 DIAGNOSIS — Z8679 Personal history of other diseases of the circulatory system: Secondary | ICD-10-CM | POA: Diagnosis not present

## 2015-07-22 DIAGNOSIS — K529 Noninfective gastroenteritis and colitis, unspecified: Secondary | ICD-10-CM

## 2015-07-22 DIAGNOSIS — Z79899 Other long term (current) drug therapy: Secondary | ICD-10-CM | POA: Insufficient documentation

## 2015-07-22 DIAGNOSIS — M810 Age-related osteoporosis without current pathological fracture: Secondary | ICD-10-CM | POA: Insufficient documentation

## 2015-07-22 DIAGNOSIS — R197 Diarrhea, unspecified: Secondary | ICD-10-CM

## 2015-07-22 LAB — URINALYSIS, ROUTINE W REFLEX MICROSCOPIC
Glucose, UA: NEGATIVE mg/dL
Hgb urine dipstick: NEGATIVE
KETONES UR: 40 mg/dL — AB
LEUKOCYTES UA: NEGATIVE
Nitrite: NEGATIVE
Protein, ur: NEGATIVE mg/dL
Specific Gravity, Urine: 1.028 (ref 1.005–1.030)
UROBILINOGEN UA: 1 mg/dL (ref 0.0–1.0)
pH: 6 (ref 5.0–8.0)

## 2015-07-22 LAB — COMPREHENSIVE METABOLIC PANEL
ALT: 9 U/L — AB (ref 14–54)
ANION GAP: 6 (ref 5–15)
AST: 19 U/L (ref 15–41)
Albumin: 3.4 g/dL — ABNORMAL LOW (ref 3.5–5.0)
Alkaline Phosphatase: 60 U/L (ref 38–126)
BUN: 17 mg/dL (ref 6–20)
CALCIUM: 8.1 mg/dL — AB (ref 8.9–10.3)
CO2: 24 mmol/L (ref 22–32)
CREATININE: 0.6 mg/dL (ref 0.44–1.00)
Chloride: 106 mmol/L (ref 101–111)
Glucose, Bld: 114 mg/dL — ABNORMAL HIGH (ref 65–99)
Potassium: 4.1 mmol/L (ref 3.5–5.1)
SODIUM: 136 mmol/L (ref 135–145)
Total Bilirubin: 0.4 mg/dL (ref 0.3–1.2)
Total Protein: 6.8 g/dL (ref 6.5–8.1)

## 2015-07-22 LAB — CBC
HCT: 37.2 % (ref 36.0–46.0)
HEMOGLOBIN: 10.9 g/dL — AB (ref 12.0–15.0)
MCH: 25.3 pg — ABNORMAL LOW (ref 26.0–34.0)
MCHC: 29.3 g/dL — ABNORMAL LOW (ref 30.0–36.0)
MCV: 86.3 fL (ref 78.0–100.0)
PLATELETS: 263 10*3/uL (ref 150–400)
RBC: 4.31 MIL/uL (ref 3.87–5.11)
RDW: 25.8 % — ABNORMAL HIGH (ref 11.5–15.5)
WBC: 9.3 10*3/uL (ref 4.0–10.5)

## 2015-07-22 LAB — LIPASE, BLOOD: Lipase: 17 U/L — ABNORMAL LOW (ref 22–51)

## 2015-07-22 MED ORDER — SODIUM CHLORIDE 0.9 % IV SOLN
Freq: Once | INTRAVENOUS | Status: AC
  Administered 2015-07-22: 17:00:00 via INTRAVENOUS

## 2015-07-22 MED ORDER — ONDANSETRON HCL 4 MG/2ML IJ SOLN
4.0000 mg | Freq: Once | INTRAMUSCULAR | Status: AC
  Administered 2015-07-22: 4 mg via INTRAVENOUS
  Filled 2015-07-22: qty 2

## 2015-07-22 MED ORDER — IOHEXOL 300 MG/ML  SOLN: 50.0000 mL | Freq: Once | INTRAMUSCULAR | Status: DC | PRN

## 2015-07-22 MED ORDER — IOHEXOL 300 MG/ML  SOLN
100.0000 mL | Freq: Once | INTRAMUSCULAR | Status: AC | PRN
  Administered 2015-07-22: 80 mL via INTRAVENOUS

## 2015-07-22 MED ORDER — MORPHINE SULFATE (PF) 2 MG/ML IV SOLN
1.0000 mg | Freq: Once | INTRAVENOUS | Status: AC
  Administered 2015-07-22: 1 mg via INTRAVENOUS
  Filled 2015-07-22: qty 1

## 2015-07-22 MED ORDER — ONDANSETRON HCL 4 MG PO TABS: 4.0000 mg | ORAL_TABLET | Freq: Three times a day (TID) | ORAL | Status: DC | PRN

## 2015-07-22 NOTE — Discharge Instructions (Signed)
Return without fail for worsening symptoms, including worsening pain, fevers, inability to keep down food or fluids, or any other symptoms concerning to you. Please follow-up with your primary care provider on the next 2-3 days to make sure symptoms are improving.  Diarrhea Diarrhea is watery poop (stool). It can make you feel weak, tired, thirsty, or give you a dry mouth (signs of dehydration). Watery poop is a sign of another problem, most often an infection. It often lasts 2-3 days. It can last longer if it is a sign of something serious. Take care of yourself as told by your doctor. HOME CARE   Drink 1 cup (8 ounces) of fluid each time you have watery poop.  Do not drink the following fluids:  Those that contain simple sugars (fructose, glucose, galactose, lactose, sucrose, maltose).  Sports drinks.  Fruit juices.  Whole milk products.  Sodas.  Drinks with caffeine (coffee, tea, soda) or alcohol.  Oral rehydration solution may be used if the doctor says it is okay. You may make your own solution. Follow this recipe:   - teaspoon table salt.   teaspoon baking soda.   teaspoon salt substitute containing potassium chloride.  1 tablespoons sugar.  1 liter (34 ounces) of water.  Avoid the following foods:  High fiber foods, such as raw fruits and vegetables.  Nuts, seeds, and whole grain breads and cereals.   Those that are sweetened with sugar alcohols (xylitol, sorbitol, mannitol).  Try eating the following foods:  Starchy foods, such as rice, toast, pasta, low-sugar cereal, oatmeal, baked potatoes, crackers, and bagels.  Bananas.  Applesauce.  Eat probiotic-rich foods, such as yogurt and milk products that are fermented.  Wash your hands well after each time you have watery poop.  Only take medicine as told by your doctor.  Take a warm bath to help lessen burning or pain from having watery poop. GET HELP RIGHT AWAY IF:   You cannot drink fluids without  throwing up (vomiting).  You keep throwing up.  You have blood in your poop, or your poop looks black and tarry.  You do not pee (urinate) in 6-8 hours, or there is only a small amount of very dark pee.  You have belly (abdominal) pain that gets worse or stays in the same spot (localizes).  You are weak, dizzy, confused, or light-headed.  You have a very bad headache.  Your watery poop gets worse or does not get better.  You have a fever or lasting symptoms for more than 2-3 days.  You have a fever and your symptoms suddenly get worse. MAKE SURE YOU:   Understand these instructions.  Will watch your condition.  Will get help right away if you are not doing well or get worse. Document Released: 04/29/2008 Document Revised: 03/28/2014 Document Reviewed: 07/19/2012 [employer] Patient Information 2015 [place], [place]. This information is not intended to replace advice given to you by your health care provider. Make sure you discuss any questions you have with your health care provider.  Nausea and Vomiting Nausea is a sick feeling that often comes before throwing up (vomiting). Vomiting is a reflex where stomach contents come out of your mouth. Vomiting can cause severe loss of body fluids (dehydration). Children and elderly adults can become dehydrated quickly, especially if they also have diarrhea. Nausea and vomiting are symptoms of a condition or disease. It is important to find the cause of your symptoms. CAUSES   Direct irritation of the stomach lining. This irritation  can result from increased acid production (gastroesophageal reflux disease), infection, food poisoning, taking certain medicines (such as nonsteroidal anti-inflammatory drugs), alcohol use, or tobacco use.  Signals from the brain.These signals could be caused by a headache, heat exposure, an inner ear disturbance, increased pressure in the brain from injury, infection, a tumor, or a concussion, pain, emotional  stimulus, or metabolic problems.  An obstruction in the gastrointestinal tract (bowel obstruction).  Illnesses such as diabetes, hepatitis, gallbladder problems, appendicitis, kidney problems, cancer, sepsis, atypical symptoms of a heart attack, or eating disorders.  Medical treatments such as chemotherapy and radiation.  Receiving medicine that makes you sleep (general anesthetic) during surgery. DIAGNOSIS Your caregiver may ask for tests to be done if the problems do not improve after a few days. Tests may also be done if symptoms are severe or if the reason for the nausea and vomiting is not clear. Tests may include:  Urine tests.  Blood tests.  Stool tests.  Cultures (to look for evidence of infection).  X-rays or other imaging studies. Test results can help your caregiver make decisions about treatment or the need for additional tests. TREATMENT You need to stay well hydrated. Drink frequently but in small amounts.You may wish to drink water, sports drinks, clear broth, or eat frozen ice pops or gelatin dessert to help stay hydrated.When you eat, eating slowly may help prevent nausea.There are also some antinausea medicines that may help prevent nausea. HOME CARE INSTRUCTIONS   Take all medicine as directed by your caregiver.  If you do not have an appetite, do not force yourself to eat. However, you must continue to drink fluids.  If you have an appetite, eat a normal diet unless your caregiver tells you differently.  Eat a variety of complex carbohydrates (rice, wheat, potatoes, bread), lean meats, yogurt, fruits, and vegetables.  Avoid high-fat foods because they are more difficult to digest.  Drink enough water and fluids to keep your urine clear or pale yellow.  If you are dehydrated, ask your caregiver for specific rehydration instructions. Signs of dehydration may include:  Severe thirst.  Dry lips and mouth.  Dizziness.  Dark urine.  Decreasing urine  frequency and amount.  Confusion.  Rapid breathing or pulse. SEEK IMMEDIATE MEDICAL CARE IF:   You have blood or brown flecks (like coffee grounds) in your vomit.  You have black or bloody stools.  You have a severe headache or stiff neck.  You are confused.  You have severe abdominal pain.  You have chest pain or trouble breathing.  You do not urinate at least once every 8 hours.  You develop cold or clammy skin.  You continue to vomit for longer than 24 to 48 hours.  You have a fever. MAKE SURE YOU:   Understand these instructions.  Will watch your condition.  Will get help right away if you are not doing well or get worse. Document Released: 11/11/2005 Document Revised: 02/03/2012 Document Reviewed: 04/10/2011 [facility] Patient Information 2015 [place], [place]. This information is not intended to replace advice given to you by your health care provider. Make sure you discuss any questions you have with your health care provider.

## 2015-07-22 NOTE — ED Provider Notes (Signed)
CSN: [id number]     Arrival date & time 07/22/15  1416 History   First MD Initiated Contact with Patient 07/22/15 1548     Chief Complaint  Patient presents with  . Abdominal Pain  . Emesis  . Diarrhea     (Consider location/radiation/quality/duration/timing/severity/associated sxs/prior Treatment) HPI 81 year old female who presents with abdominal pain, nausea, vomiting, and diarrhea. She has no prior history of abdominal surgeries and has a history of IBS. Reports being in her usual state of health in 2 days ago developed low abdominal pain with dry heaves and multiple episodes of watery stools. LLQ and suprapubic pain, non-radiating, and constant. She denies any recent antibiotic use, fevers, chills, or urinary symptoms. She has been having decreasing by mouth intake.    Past Medical History  Diagnosis Date  . Rheumatoid arthritis(714.0)   . IBS (irritable bowel syndrome)   . Constipation   . Allergy     seasonal  . Osteoporosis   . Sciatica   . Peripheral vascular disease     "beyond help"  . Pneumonia 05/25/12; 2010   Past Surgical History  Procedure Laterality Date  . Replacement total knee  ~ 2010    left  . Facial cosmetic surgery      left cheek  . Rotator cuff repair      right  . Bunionectomy  1990's    bilateral  . Joint replacement    . Partial hip arthroplasty  05/26/12    right  . Cataract extraction w/ intraocular lens  implant, bilateral  ~ 2003  . Eye surgery  ~ 2003    right; "precancerous; took out section in lower part of eye"  . Hip arthroplasty  05/26/2012    Procedure: ARTHROPLASTY BIPOLAR HIP;  Surgeon: Hessie Dibble, MD;  Location: [facility];  Service: Orthopedics;  Laterality: Right;   Family History  Problem Relation Age of Onset  . Lung cancer Sister   . Uterine cancer Sister   . Colon cancer Neg Hx   . Heart disease Father   . GI problems Son   . Irritable bowel syndrome Son    Social History  Substance Use Topics  . Smoking status:  Former Smoker -- 0.10 packs/day for 50 years    Types: Cigarettes    Quit date: 11/26/1987  . Smokeless tobacco: Never Used  . Alcohol Use: No   OB History    No data available     Review of Systems 10/14 systems reviewed and are negative other than those stated in the HPI    Allergies  Fosamax  Home Medications   Prior to Admission medications   Medication Sig Start Date End Date Taking? Authorizing Provider  calcium carbonate (OS-CAL) 600 MG TABS Take 600 mg by mouth 2 (two) times daily with a meal.     Yes Historical Provider, MD  HYDROcodone-acetaminophen (NORCO) 10-325 MG per tablet Take 1 tablet by mouth every 8 (eight) hours as needed. Patient taking differently: Take 1 tablet by mouth every 8 (eight) hours as needed for moderate pain.  06/16/15  Yes Zenia Resides, MD  methotrexate (RHEUMATREX) 2.5 MG tablet Take 2.5 mg by mouth once a week. Caution:Chemotherapy. Protect from light.   Yes Historical Provider, MD  polyethylene glycol (MIRALAX / GLYCOLAX) packet Take 17 g by mouth daily as needed for mild constipation.   Yes Historical Provider, MD  omeprazole (PRILOSEC) 40 MG capsule Take 1 capsule (40 mg total) by mouth daily. Patient not  taking: Reported on 07/22/2015 03/24/15   Zenia Resides, MD  ondansetron (ZOFRAN) 4 MG tablet Take 1 tablet (4 mg total) by mouth every 8 (eight) hours as needed for nausea or vomiting. 07/22/15   Forde Dandy, MD  tolterodine (DETROL LA) 2 MG 24 hr capsule Take 1 capsule (2 mg total) by mouth daily. Patient not taking: Reported on 07/22/2015 06/27/15   Zenia Resides, MD   BP 116/67 mmHg  Pulse 99  Temp(Src) 98.3 F (36.8 C) (Oral)  Resp 16  SpO2 92% Physical Exam Physical Exam  Nursing note and vitals reviewed. Constitutional: Well developed, well nourished, non-toxic, and in no acute distress Head: Normocephalic and atraumatic.  Mouth/Throat: Oropharynx is clear. Mucous membranes dry..  Neck: Normal range of motion. Neck  supple.  Cardiovascular: Normal rate and regular rhythm.   Pulmonary/Chest: Effort normal and breath sounds normal.  Abdominal: Soft. No distension. Tender suprapubic and LLQ. No CVA tenderness. There is no rebound and no guarding.  Musculoskeletal: Normal range of motion.  Neurological: Alert, no facial droop, fluent speech, moves all extremities symmetrically Skin: Skin is warm and dry.  Psychiatric: Cooperative  ED Course  Procedures (including critical care time) Labs Review Labs Reviewed  LIPASE, BLOOD - Abnormal; Notable for the following:    Lipase 17 (*)    All other components within normal limits  COMPREHENSIVE METABOLIC PANEL - Abnormal; Notable for the following:    Glucose, Bld 114 (*)    Calcium 8.1 (*)    Albumin 3.4 (*)    ALT 9 (*)    All other components within normal limits  CBC - Abnormal; Notable for the following:    Hemoglobin 10.9 (*)    MCH 25.3 (*)    MCHC 29.3 (*)    RDW 25.8 (*)    All other components within normal limits  URINALYSIS, ROUTINE W REFLEX MICROSCOPIC (NOT AT [facility]) - Abnormal; Notable for the following:    Color, Urine AMBER (*)    APPearance CLOUDY (*)    Bilirubin Urine SMALL (*)    Ketones, ur 40 (*)    All other components within normal limits    Imaging Review Ct Abdomen Pelvis W Contrast  07/22/2015   CLINICAL DATA:  Abdominal pain, nausea, vomiting, diarrhea. Symptoms began 2 days ago. Left lower quadrant and suprapubic pain.  EXAM: CT ABDOMEN AND PELVIS WITH CONTRAST  TECHNIQUE: Multidetector CT imaging of the abdomen and pelvis was performed using the standard protocol following bolus administration of intravenous contrast.  CONTRAST:  8mL OMNIPAQUE IOHEXOL 300 MG/ML  SOLN  COMPARISON:  08/29/2009  FINDINGS: Areas of scarring/ fibrosis in the lung bases. No pleural effusions. There is cardiomegaly. Coronary artery calcifications.  Low-density areas within the liver most compatible with cysts. Gallbladder, spleen, pancreas,  adrenals and kidneys unremarkable. Small cysts in the kidneys bilaterally. No hydronephrosis.  There is free fluid adjacent to the liver and spleen as well as throughout the abdomen and pelvis into the cul-de-sac. Abnormal wall thickening noted within mid and distal small bowel loops most compatible with enteritis. This could be infectious or ischemic. Stomach and large bowel are unremarkable. Aorta is calcified and tortuous, non aneurysmal. Calcified plaque noted at the origin of the celiac artery and superior mesenteric artery, but both vessels appear widely patent. Inferior mesenteric artery is patent, but difficult to visualize the origin.  Urinary bladder and lower pelvic structures difficult to visualize due to beam hardening artifact from bilateral hip hardware.  IMPRESSION: Abnormal wall thickening within the mid and distal small bowel loops compatible with enteritis, likely infectious or ischemic. Associated free fluid in the abdomen and pelvis.  Aorta is calcified, but the celiac artery and superior mesenteric artery appear widely patent.  Coronary artery disease.  Cardiomegaly.  Scarring/ fibrosis in the lung bases.   Electronically Signed   By: Rolm Baptise M.D.   On: 07/22/2015 16:57   I have personally reviewed and evaluated these images and lab results as part of my medical decision-making.   MDM   Final diagnoses:  Enteritis  Vomiting and diarrhea    The patient is an 81 year old female with no prior abdominal surgeries who presents with abdominal pain, nausea, vomiting, and diarrhea. She is nontoxic in no acute distress on presentation. She hasn't near tachycardia with a heart rate in the 90s, and appears dehydrated on exam, but is normotensive. Her abdomen is soft and non-peritoneal, but there is focal left lower quadrant and suprapubic tenderness to palpation. Remainder of exam is otherwise unremarkable. Basic blood work including CBC, comp Metabolic Panel, and Lipase Are Unremarkable.  Concern for Possible Diverticulitis or Other Acute Intra-Abdominal Process at This Time. She Will Undergo CT Abdomen and Pelvis. This is visualized and reviewed with radiology. Evidence of enteritis, without diverticulitis. At this time I do not think antibiotics are indicated,  she has no risk factors for C. Difficile.  Supportively treated with IVF, antiemetics, and analgesics. She feels symptomatically improved, and is able to eat a sandwich here in the emergency department. She is felt stable for discharge home. Strict return and follow-up instructions are reviewed. She expressed understanding of all discharge instructions and felt comfortable to plan of care.   Forde Dandy, MD 07/23/15 [phone]

## 2015-07-22 NOTE — ED Notes (Signed)
Pt c/o upper abdominal pain and n/v/d x 2 days.  Pain score 8/10.  Hx of IBS.  Denies recent antibiotic use.

## 2015-07-24 ENCOUNTER — Telehealth: Payer: Self-pay | Admitting: Family Medicine

## 2015-07-24 MED ORDER — TOLTERODINE TARTRATE ER 2 MG PO CP24
2.0000 mg | ORAL_CAPSULE | Freq: Every day | ORAL | Status: DC
Start: 2015-07-24 — End: 2015-09-06

## 2015-07-24 NOTE — Telephone Encounter (Signed)
Pt called and would like a refill on Detrol called in. jw

## 2015-09-06 ENCOUNTER — Encounter: Payer: Self-pay | Admitting: Family Medicine

## 2015-09-06 ENCOUNTER — Telehealth: Payer: Self-pay | Admitting: Family Medicine

## 2015-09-06 ENCOUNTER — Ambulatory Visit (INDEPENDENT_AMBULATORY_CARE_PROVIDER_SITE_OTHER): Payer: Commercial Managed Care - HMO | Admitting: Family Medicine

## 2015-09-06 VITALS — BP 115/69 | HR 117 | Temp 98.2°F

## 2015-09-06 DIAGNOSIS — K5900 Constipation, unspecified: Secondary | ICD-10-CM | POA: Diagnosis not present

## 2015-09-06 DIAGNOSIS — K582 Mixed irritable bowel syndrome: Secondary | ICD-10-CM | POA: Diagnosis not present

## 2015-09-06 MED ORDER — SIMETHICONE 80 MG PO TABS: 80.0000 mg | ORAL_TABLET | Freq: Three times a day (TID) | ORAL | Status: AC

## 2015-09-06 MED ORDER — SIMETHICONE 80 MG PO TABS: 80.0000 mg | ORAL_TABLET | Freq: Three times a day (TID) | ORAL | Status: DC

## 2015-09-06 MED ORDER — MAGNESIUM CITRATE PO SOLN: 0.5000 | Freq: Once | ORAL | Status: AC

## 2015-09-06 MED ORDER — TOLTERODINE TARTRATE ER 2 MG PO CP24: 2.0000 mg | ORAL_CAPSULE | Freq: Every day | ORAL | Status: DC

## 2015-09-06 MED ORDER — DOCUSATE SODIUM 283 MG RE ENEM: 1.0000 | ENEMA | Freq: Every day | RECTAL | Status: AC

## 2015-09-06 MED ORDER — MAGNESIUM CITRATE PO SOLN: 0.5000 | Freq: Once | ORAL | Status: DC

## 2015-09-06 MED ORDER — DOCUSATE SODIUM 283 MG RE ENEM: 1.0000 | ENEMA | Freq: Every day | RECTAL | Status: DC

## 2015-09-06 NOTE — Progress Notes (Signed)
Patient ID: Caroline Lewis, female   DOB: Dec 22, 1923, 81 y.o.   MRN: [id number]    Subjective: [id number]  HPI: Patient is a 81 y.o. female with a past medical history of constipation, IBS, GERD and chronic pain presenting to  clinic today for a SDA for abdominal pain and constipation  Has been constipated since Saturday/Sunday. She had small soft bowel movement Monday. No hematochezia or melana. No diarrhea. She's had some bloating that has gotten progressively worse. She last passed gas yesterday.  She has centrally located abdominal pain that does not radiate. She is unable to characterize the pain but notes it starts off mild then progressively worsens, then self resolves all within approximately 5 minutes.  She's has some nausea that began Sunday/Monday but no vomiting. She is still eating and drinking, but notes that the amount has decreased due to the bloating. She takes MiraLax intermittently and last took it Saturday- notes she stopped taking it because she was full and uncomfortable drinking it. She tried to insert a Fleets enema but states it was difficult and didn't work.   No recent illness or fevers. She uses well water. No unusual foods. No previous abdominal surgeries. She has not been taking fiber and notes she isn't aware of what gas forming foods she should avoid. They did not bring in her [employer] form. She is taking narcotics regularly.   Social History: former smoker   ROS: All other systems reviewed and are negative.  Past Medical History Patient Active Problem List   Diagnosis Date Noted  . Dysphagia 03/24/2015  . Encounter for chronic pain management 11/11/2014  . Seborrheic keratoses 09/29/2014  . Protein-calorie malnutrition ([place]) 07/01/2014  . Irritable bowel syndrome 07/01/2014  . Osteoarthritis of right knee 07/01/2014  . Gastroesophageal reflux disease 04/08/2014  . At high risk for falls 04/08/2014  . Fracture of femoral neck, left, closed 02/19/2013    . Low back pain 09/11/2012  . Actinic keratitis 08/13/2012  . Screening cholesterol level 07/10/2012  . Anemia of chronic disease 05/30/2012  . S/P hip replacement 05/25/2012  . Osteoporosis 05/25/2012  . Urge incontinence 10/11/2011  . Rheumatoid arthritis ([place]) 05/31/2011  . Constipation 03/06/2011    Medications- reviewed and updated Current Outpatient Prescriptions  Medication Sig Dispense Refill  . calcium carbonate (OS-CAL) 600 MG TABS Take 600 mg by mouth 2 (two) times daily with a meal.      . docusate sodium (ENEMEEZ) 283 MG enema Place 1 enema (283 mg total) rectally daily. 10 each 0  . HYDROcodone-acetaminophen (NORCO) 10-325 MG per tablet Take 1 tablet by mouth every 8 (eight) hours as needed. (Patient taking differently: Take 1 tablet by mouth every 8 (eight) hours as needed for moderate pain. ) 270 tablet 0  . magnesium citrate SOLN Take 148 mLs (0.5 Bottles total) by mouth once. 195 mL 1  . methotrexate (RHEUMATREX) 2.5 MG tablet Take 2.5 mg by mouth once a week. Caution:Chemotherapy. Protect from light.    Marland Kitchen omeprazole (PRILOSEC) 40 MG capsule Take 1 capsule (40 mg total) by mouth daily. (Patient not taking: Reported on 07/22/2015) 90 capsule 3  . ondansetron (ZOFRAN) 4 MG tablet Take 1 tablet (4 mg total) by mouth every 8 (eight) hours as needed for nausea or vomiting. 12 tablet 0  . polyethylene glycol (MIRALAX / GLYCOLAX) packet Take 17 g by mouth daily as needed for mild constipation.    . Simethicone 80 MG TABS Take 1 tablet (80 mg total) by mouth  4 (four) times daily -  before meals and at bedtime. 60 tablet 0  . tolterodine (DETROL LA) 2 MG 24 hr capsule Take 1 capsule (2 mg total) by mouth daily. 30 capsule 6   No current facility-administered medications for this visit.    Objective: Office vital signs reviewed. BP 115/69 mmHg  Pulse 117  Temp(Src) 98.2 F (36.8 C) (Oral)  Wt    Physical Examination:  General: Awake, alert, well- nourished, non-toxic in  appearance.  HEENT: moist mucous membranes.  Cardio: Tachycardic to low 100s on my exam, regular rhythm, no m/r/g noted. No pitting edema.   Pulm: No increased WOB.  CTAB, without wheezes, rhonchi or crackles noted.  GI: +BS in all 4 quadrants. Soft, non-distended, denies any tenderness to palpation in all 4 quadrants. DRE: Normal tone. No prominent hemorrhoids noted. No stool/fecal impaction noted.  MSK: Requires assistance with transfer from wheel chair to exam table. Skin: dry, intact, no rashes or lesions   Assessment/Plan: Irritable bowel syndrome Gave information about gas-forming foods to avoid to assist with bloating. Could consider Linzess in the future, however I am not convince that she's failed alternative treatments (as she still has lifestyle modifications to make and is not taking PEG routinely).   Constipation Patient reports going 1-2 says without a BM. No evidence of fecal impaction on DRE. Patient notes being able to pass flatus after DRE which is reassuring. Pt has good bowel sounds in all 4 quadrates, no h/o of abdominal surgeries, and passed flatus after DRE which is reassuring that this is not a SBO. We do not have enemas, suppositories, or barium enema kits that we can administer/provide in clinic. Daughter understandably frustrated that her mother is in discomfort and inquired about direct admission which was not felt to be appropriate. Patient's PCP, Dr. Andria Frames, spoke with the patient and daughter concerning the best course of action at this time and family was in agreement.  Giving pain medications at this time would only further worsen the constipation. Given the patient's age, I do not fee an antispasmodic would be appropriate given the anticholingeric effects.  - Provided Rx for magnesium citrate solution (unfortunately insurance does not cover the pill form). - Rx for docusate enema, advised to discontinue Fleets enema - Rx for simethicone for bloating, unsure how  helpful this will be but given her age, it is a better alternative than an antispasmodic.  - Strict RTC precautions: worsening pain, change in pain, inability to pass flatus or have a BM, inability to take PO, or fevers.  - I suspect in the future the patient should be on scheduled MiraLax given once the constipation/bloating begins she does not wish to take the medication as it is uncomfortable.     No orders of the defined types were placed in this encounter.    Meds ordered this encounter  Medications  . DISCONTD: Simethicone 80 MG TABS    Sig: Take 1 tablet (80 mg total) by mouth 4 (four) times daily -  before meals and at bedtime.    Dispense:  60 tablet    Refill:  0  . DISCONTD: docusate sodium (ENEMEEZ) 283 MG enema    Sig: Place 1 enema (283 mg total) rectally daily.    Dispense:  10 each    Refill:  0  . DISCONTD: magnesium citrate SOLN    Sig: Take 148 mLs (0.5 Bottles total) by mouth once.    Dispense:  195 mL    Refill:  1  . Simethicone 80 MG TABS    Sig: Take 1 tablet (80 mg total) by mouth 4 (four) times daily -  before meals and at bedtime.    Dispense:  60 tablet    Refill:  0  . docusate sodium (ENEMEEZ) 283 MG enema    Sig: Place 1 enema (283 mg total) rectally daily.    Dispense:  10 each    Refill:  0  . magnesium citrate SOLN    Sig: Take 148 mLs (0.5 Bottles total) by mouth once.    Dispense:  195 mL    Refill:  1  . tolterodine (DETROL LA) 2 MG 24 hr capsule    Sig: Take 1 capsule (2 mg total) by mouth daily.    Dispense:  30 capsule    Refill:  [place] PGY-2, [place]

## 2015-09-06 NOTE — Patient Instructions (Addendum)
START taking Magnesium citrate  I have also prescribed an enema, I'm sorry that we could not give her one while she was here. Start taking a gummy fiber supplement Avoid gas- forming foods such as the ones I provided in a list.  Start taking Simethicone four times a day (before meals and at bedtime) for bloating. Follow up with your PCP in 2 weeks or sooner if pain does not improve.

## 2015-09-07 ENCOUNTER — Encounter: Payer: Self-pay | Admitting: Family Medicine

## 2015-09-07 NOTE — Assessment & Plan Note (Signed)
Gave information about gas-forming foods to avoid to assist with bloating. Could consider Linzess in the future, however I am not convince that she's failed alternative treatments (as she still has lifestyle modifications to make and is not taking PEG routinely).

## 2015-09-07 NOTE — Assessment & Plan Note (Signed)
Patient reports going 1-2 says without a BM. No evidence of fecal impaction on DRE. Patient notes being able to pass flatus after DRE which is reassuring. Pt has good bowel sounds in all 4 quadrates, no h/o of abdominal surgeries, and passed flatus after DRE which is reassuring that this is not a SBO. We do not have enemas, suppositories, or barium enema kits that we can administer/provide in clinic. Daughter understandably frustrated that her mother is in discomfort and inquired about direct admission which was not felt to be appropriate. Patient's PCP, Dr. Andria Frames, spoke with the patient and daughter concerning the best course of action at this time and family was in agreement.  Giving pain medications at this time would only further worsen the constipation. Given the patient's age, I do not fee an antispasmodic would be appropriate given the anticholingeric effects.  - Provided Rx for magnesium citrate solution (unfortunately insurance does not cover the pill form). - Rx for docusate enema, advised to discontinue Fleets enema - Rx for simethicone for bloating, unsure how helpful this will be but given her age, it is a better alternative than an antispasmodic.  - Strict RTC precautions: worsening pain, change in pain, inability to pass flatus or have a BM, inability to take PO, or fevers.  - I suspect in the future the patient should be on scheduled MiraLax given once the constipation/bloating begins she does not wish to take the medication as it is uncomfortable.

## 2015-09-19 ENCOUNTER — Encounter: Payer: Self-pay | Admitting: Family Medicine

## 2015-09-19 ENCOUNTER — Ambulatory Visit (INDEPENDENT_AMBULATORY_CARE_PROVIDER_SITE_OTHER): Payer: Commercial Managed Care - HMO | Admitting: Family Medicine

## 2015-09-19 VITALS — BP 110/60 | HR 101 | Temp 98.2°F | Ht 60.0 in | Wt 100.0 lb

## 2015-09-19 DIAGNOSIS — N950 Postmenopausal bleeding: Secondary | ICD-10-CM | POA: Diagnosis not present

## 2015-09-19 DIAGNOSIS — R609 Edema, unspecified: Secondary | ICD-10-CM | POA: Diagnosis not present

## 2015-09-19 DIAGNOSIS — K219 Gastro-esophageal reflux disease without esophagitis: Secondary | ICD-10-CM

## 2015-09-19 DIAGNOSIS — K920 Hematemesis: Secondary | ICD-10-CM | POA: Diagnosis not present

## 2015-09-19 DIAGNOSIS — R600 Localized edema: Secondary | ICD-10-CM | POA: Insufficient documentation

## 2015-09-19 DIAGNOSIS — R110 Nausea: Secondary | ICD-10-CM

## 2015-09-19 DIAGNOSIS — R0600 Dyspnea, unspecified: Secondary | ICD-10-CM

## 2015-09-19 LAB — COMPREHENSIVE METABOLIC PANEL
ALK PHOS: 57 U/L (ref 33–130)
ALT: 8 U/L (ref 6–29)
AST: 17 U/L (ref 10–35)
Albumin: 2.9 g/dL — ABNORMAL LOW (ref 3.6–5.1)
BILIRUBIN TOTAL: 0.6 mg/dL (ref 0.2–1.2)
BUN: 14 mg/dL (ref 7–25)
CALCIUM: 7.8 mg/dL — AB (ref 8.6–10.4)
CO2: 30 mmol/L (ref 20–31)
Chloride: 99 mmol/L (ref 98–110)
Creat: 0.58 mg/dL — ABNORMAL LOW (ref 0.60–0.88)
GLUCOSE: 113 mg/dL — AB (ref 65–99)
Potassium: 4 mmol/L (ref 3.5–5.3)
SODIUM: 138 mmol/L (ref 135–146)
TOTAL PROTEIN: 5.7 g/dL — AB (ref 6.1–8.1)

## 2015-09-19 LAB — FERRITIN: Ferritin: 27 ng/mL (ref 10–291)

## 2015-09-19 LAB — POCT HEMOGLOBIN: HEMOGLOBIN: 10.1 g/dL — AB (ref 12.2–16.2)

## 2015-09-19 LAB — TSH: TSH: 5.273 u[IU]/mL — AB (ref 0.350–4.500)

## 2015-09-19 MED ORDER — FERROUS SULFATE 325 (65 FE) MG PO TABS: 325.0000 mg | ORAL_TABLET | Freq: Three times a day (TID) | ORAL | Status: AC

## 2015-09-19 MED ORDER — OMEPRAZOLE 40 MG PO CPDR: 40.0000 mg | DELAYED_RELEASE_CAPSULE | Freq: Every day | ORAL | Status: AC

## 2015-09-19 MED ORDER — ONDANSETRON HCL 4 MG PO TABS: 4.0000 mg | ORAL_TABLET | Freq: Three times a day (TID) | ORAL | Status: AC | PRN

## 2015-09-19 NOTE — Assessment & Plan Note (Addendum)
Concern for malignancy in 81 year old patient with vaginal bleeding. Discussed next step of work up with patient and daughter, including endometrial biopsy. At this time, patient and family elected to pursue work up for GI bleed first. It was discussed with the patient and her family that post-menopausal vaginal bleeding can be the first symptoms of endometrial cancer and that an endometrial biopsy should be performed to rule this out. Patient was instructed to follow up as soon as possible for endometrial biopsy.   Will check INR today.

## 2015-09-19 NOTE — Patient Instructions (Signed)
We are referring you to a GI doctor for your vomiting and difficulty swallowing food. You will receive a phone call this week to help schedule this appointment. Please also start taking an iron supplement. Please take ferrous sulfate 325mg  three times a day with meal. Please also start taking the prilosec medication. If you start to have large amounts of vomiting, bloody stools, passing out, chest pain, or shortness of breath, please seek medical care immediately.  We will check blood work for your swelling. We will also like for you to get an echocardiogram to look at the function of your heart.  You need an endometrial biopsy to check for endometrial cancer because of your vaginal bleeding. You can schedule that here as soon as convenient.   Please come back to follow up with Dr Andria Frames in 1-2 weeks.  Take care,  Dr Jerline Pain

## 2015-09-19 NOTE — Assessment & Plan Note (Signed)
Patient also with evidence of ascites and possible pulmonary edema on exam. Will check CMP, BNP, and TSH. Will also refer to cardiology for echocardiogram. Return precautions reviewed.   Follow up with PCP in 1-2 weeks.  Precepted with Dr Erin Hearing.

## 2015-09-19 NOTE — Progress Notes (Signed)
Subjective:  Caroline Lewis is a 81 y.o. female who presents to the [facility] today for same day appointment for "vomiting black blood."  HPI:  Hematemesis.  Patient reports that she has been "throwing up black stuff" since this morning. Also reports have one episode of vomiting last week with similar appearance. Reports that she has vomited 2-3 times today. No coffee ground appearing material, just black. Last BM 2 days ago. No melana or hematochezia. Reports that she feels intermittently nauseous. Additionally reports that she feels like solid food "gets stuck" when swallowing.  Also reports pain in her epigastric area. Abdominal pain is worse after eating and has been going on for months. Pain described as sharp. Denies any recent NSAID use. Patient is unsure if she has been taking prilosec regularly.  Vaginal Bleeding Patient reports intermittent vaginal bleeding for the past several months. Reports that she only has this when wiping after peeing.  Lower Extremity Edema Patient also reports that she has had noticed increased swelling in her lower extremities for the past several weeks.   Shortness of breath at baseline. No chest pain.   ROS: Per HPI  PMH:  The following were reviewed and entered/updated in epic: Past Medical History  Diagnosis Date  . Rheumatoid arthritis(714.0)   . IBS (irritable bowel syndrome)   . Constipation   . Allergy     seasonal  . Osteoporosis   . Sciatica   . Peripheral vascular disease ([place])     "beyond help"  . Pneumonia 05/25/12; 2010   Patient Active Problem List   Diagnosis Date Noted  . Dark emesis 09/19/2015  . Post-menopausal bleeding 09/19/2015  . Bilateral lower extremity edema 09/19/2015  . Dysphagia 03/24/2015  . Encounter for chronic pain management 11/11/2014  . Seborrheic keratoses 09/29/2014  . Protein-calorie malnutrition ([place]) 07/01/2014  . Irritable bowel syndrome 07/01/2014  . Osteoarthritis of right knee 07/01/2014    . Gastroesophageal reflux disease 04/08/2014  . At high risk for falls 04/08/2014  . Fracture of femoral neck, left, closed 02/19/2013  . Low back pain 09/11/2012  . Actinic keratitis 08/13/2012  . Screening cholesterol level 07/10/2012  . Anemia of chronic disease 05/30/2012  . S/P hip replacement 05/25/2012  . Osteoporosis 05/25/2012  . Urge incontinence 10/11/2011  . Rheumatoid arthritis ([place]) 05/31/2011  . Constipation 03/06/2011   Past Surgical History  Procedure Laterality Date  . Replacement total knee  ~ 2010    left  . Facial cosmetic surgery      left cheek  . Rotator cuff repair      right  . Bunionectomy  1990's    bilateral  . Joint replacement    . Partial hip arthroplasty  05/26/12    right  . Cataract extraction w/ intraocular lens  implant, bilateral  ~ 2003  . Eye surgery  ~ 2003    right; "precancerous; took out section in lower part of eye"  . Hip arthroplasty  05/26/2012    Procedure: ARTHROPLASTY BIPOLAR HIP;  Surgeon: Hessie Dibble, MD;  Location: [facility];  Service: Orthopedics;  Laterality: Right;     Objective:  Physical Exam: BP 110/60 mmHg  Pulse 101  Temp(Src) 98.2 F (36.8 C) (Oral)  Ht 5' (1.524 m)  Wt 100 lb (45.36 kg)  BMI 19.53 kg/m2  Gen: Elderly appearing 81yo female in NAD, resting comfortably CV: Slightly tachycardic. No murmurs appreciated.  Lungs: NWOB, Crackles in bilateral bases noted, otherwise CTAB.  GI: +BS,  Diffusely distended abdomen consistent with ascites. Mildly tender to palpation in epigastric area.  MSK: 2 to 3+ pitting edema in BLE to mid shins. No cyanosis noted.  Skin: warm, dry Neuro: grossly normal, moves all extremities Psych: Normal affect and thought content  Results for orders placed or performed in visit on 09/19/15 (from the past 72 hour(s))  POCT hemoglobin     Status: Abnormal   Collection Time: 09/19/15  2:20 PM  Result Value Ref Range   Hemoglobin 10.1 (A) 12.2 - 16.2 g/dL      Assessment/Plan:  Dark emesis Concern for GI bleed. Hgb and vitals stable today, does not need admission. Will refer to GI for endoscopy, especially given her history of progressive dysphagia. Concern for PUD given history of epigastric pain worse after eating. Will restart daily PPI. Will start oral iron supplementation. Follow up in 1-2 weeks with PCP.   Will check CBC and ferritin today.   Precepted with Dr Erin Hearing  Post-menopausal bleeding Concern for malignancy in 81 year old patient with vaginal bleeding. Discussed next step of work up with patient and daughter, including endometrial biopsy. At this time, patient and family elected to pursue work up for GI bleed first. It was discussed with the patient and her family that post-menopausal vaginal bleeding can be the first symptoms of endometrial cancer and that an endometrial biopsy should be performed to rule this out. Patient was instructed to follow up as soon as possible for endometrial biopsy.   Will check INR today.   Bilateral lower extremity edema Patient also with evidence of ascites and possible pulmonary edema on exam. Will check CMP, BNP, and TSH. Will also refer to cardiology for echocardiogram. Return precautions reviewed.   Follow up with PCP in 1-2 weeks.  Precepted with Dr Erin Hearing.     Algis Greenhouse. Jerline Pain, [place] [facility] Resident PGY-2 09/19/2015 3:26 PM

## 2015-09-19 NOTE — Addendum Note (Signed)
Addended by: Vivi Barrack on: 09/19/2015 05:07 PM   Modules accepted: Orders

## 2015-09-19 NOTE — Assessment & Plan Note (Signed)
Concern for GI bleed. Hgb and vitals stable today, does not need admission. Will refer to GI for endoscopy, especially given her history of progressive dysphagia. Concern for PUD given history of epigastric pain worse after eating. Will restart daily PPI. Will start oral iron supplementation. Follow up in 1-2 weeks with PCP.   Will check CBC and ferritin today.   Precepted with Dr Erin Hearing

## 2015-09-20 ENCOUNTER — Emergency Department (HOSPITAL_COMMUNITY): Payer: Commercial Managed Care - HMO

## 2015-09-20 ENCOUNTER — Ambulatory Visit: Payer: Medicare HMO | Admitting: Family Medicine

## 2015-09-20 ENCOUNTER — Other Ambulatory Visit: Payer: Self-pay | Admitting: Family Medicine

## 2015-09-20 ENCOUNTER — Encounter (HOSPITAL_COMMUNITY): Payer: Self-pay | Admitting: *Deleted

## 2015-09-20 ENCOUNTER — Telehealth: Payer: Self-pay | Admitting: Gastroenterology

## 2015-09-20 ENCOUNTER — Inpatient Hospital Stay (HOSPITAL_COMMUNITY)
Admission: EM | Admit: 2015-09-20 | Discharge: 2015-09-25 | DRG: 299 | Disposition: A | Discharge status: Home Health | Payer: Commercial Managed Care - HMO | Attending: Family Medicine | Admitting: Family Medicine

## 2015-09-20 ENCOUNTER — Other Ambulatory Visit: Payer: Self-pay

## 2015-09-20 DIAGNOSIS — I82402 Acute embolism and thrombosis of unspecified deep veins of left lower extremity: Secondary | ICD-10-CM | POA: Diagnosis not present

## 2015-09-20 DIAGNOSIS — G8929 Other chronic pain: Secondary | ICD-10-CM | POA: Diagnosis present

## 2015-09-20 DIAGNOSIS — Z87891 Personal history of nicotine dependence: Secondary | ICD-10-CM

## 2015-09-20 DIAGNOSIS — Z681 Body mass index (BMI) 19 or less, adult: Secondary | ICD-10-CM

## 2015-09-20 DIAGNOSIS — K449 Diaphragmatic hernia without obstruction or gangrene: Secondary | ICD-10-CM | POA: Diagnosis present

## 2015-09-20 DIAGNOSIS — O2230 Deep phlebothrombosis in pregnancy, unspecified trimester: Secondary | ICD-10-CM | POA: Diagnosis present

## 2015-09-20 DIAGNOSIS — I5030 Unspecified diastolic (congestive) heart failure: Secondary | ICD-10-CM | POA: Diagnosis present

## 2015-09-20 DIAGNOSIS — K210 Gastro-esophageal reflux disease with esophagitis: Secondary | ICD-10-CM | POA: Diagnosis present

## 2015-09-20 DIAGNOSIS — M7989 Other specified soft tissue disorders: Secondary | ICD-10-CM | POA: Insufficient documentation

## 2015-09-20 DIAGNOSIS — D529 Folate deficiency anemia, unspecified: Secondary | ICD-10-CM | POA: Insufficient documentation

## 2015-09-20 DIAGNOSIS — B3781 Candidal esophagitis: Secondary | ICD-10-CM | POA: Diagnosis present

## 2015-09-20 DIAGNOSIS — E039 Hypothyroidism, unspecified: Secondary | ICD-10-CM | POA: Diagnosis present

## 2015-09-20 DIAGNOSIS — M069 Rheumatoid arthritis, unspecified: Secondary | ICD-10-CM | POA: Diagnosis present

## 2015-09-20 DIAGNOSIS — K920 Hematemesis: Secondary | ICD-10-CM | POA: Diagnosis present

## 2015-09-20 DIAGNOSIS — K2940 Chronic atrophic gastritis without bleeding: Secondary | ICD-10-CM | POA: Diagnosis present

## 2015-09-20 DIAGNOSIS — E46 Unspecified protein-calorie malnutrition: Secondary | ICD-10-CM

## 2015-09-20 DIAGNOSIS — R4702 Dysphasia: Secondary | ICD-10-CM | POA: Diagnosis present

## 2015-09-20 DIAGNOSIS — N950 Postmenopausal bleeding: Secondary | ICD-10-CM | POA: Diagnosis present

## 2015-09-20 DIAGNOSIS — Z96641 Presence of right artificial hip joint: Secondary | ICD-10-CM | POA: Diagnosis present

## 2015-09-20 DIAGNOSIS — R54 Age-related physical debility: Secondary | ICD-10-CM | POA: Diagnosis not present

## 2015-09-20 DIAGNOSIS — I272 Pulmonary hypertension, unspecified: Secondary | ICD-10-CM

## 2015-09-20 DIAGNOSIS — I82432 Acute embolism and thrombosis of left popliteal vein: Secondary | ICD-10-CM | POA: Diagnosis present

## 2015-09-20 DIAGNOSIS — I509 Heart failure, unspecified: Secondary | ICD-10-CM | POA: Diagnosis not present

## 2015-09-20 DIAGNOSIS — Z66 Do not resuscitate: Secondary | ICD-10-CM | POA: Diagnosis present

## 2015-09-20 DIAGNOSIS — Z96652 Presence of left artificial knee joint: Secondary | ICD-10-CM | POA: Diagnosis present

## 2015-09-20 DIAGNOSIS — R1310 Dysphagia, unspecified: Secondary | ICD-10-CM | POA: Diagnosis present

## 2015-09-20 DIAGNOSIS — E43 Unspecified severe protein-calorie malnutrition: Secondary | ICD-10-CM | POA: Diagnosis present

## 2015-09-20 DIAGNOSIS — I82442 Acute embolism and thrombosis of left tibial vein: Secondary | ICD-10-CM | POA: Diagnosis present

## 2015-09-20 DIAGNOSIS — Z8249 Family history of ischemic heart disease and other diseases of the circulatory system: Secondary | ICD-10-CM | POA: Diagnosis not present

## 2015-09-20 DIAGNOSIS — M1711 Unilateral primary osteoarthritis, right knee: Secondary | ICD-10-CM | POA: Diagnosis present

## 2015-09-20 DIAGNOSIS — I82412 Acute embolism and thrombosis of left femoral vein: Secondary | ICD-10-CM | POA: Diagnosis present

## 2015-09-20 DIAGNOSIS — D638 Anemia in other chronic diseases classified elsewhere: Secondary | ICD-10-CM | POA: Diagnosis present

## 2015-09-20 DIAGNOSIS — R29898 Other symptoms and signs involving the musculoskeletal system: Secondary | ICD-10-CM | POA: Insufficient documentation

## 2015-09-20 DIAGNOSIS — D62 Acute posthemorrhagic anemia: Secondary | ICD-10-CM | POA: Diagnosis not present

## 2015-09-20 DIAGNOSIS — J449 Chronic obstructive pulmonary disease, unspecified: Secondary | ICD-10-CM | POA: Diagnosis present

## 2015-09-20 DIAGNOSIS — M810 Age-related osteoporosis without current pathological fracture: Secondary | ICD-10-CM | POA: Diagnosis present

## 2015-09-20 DIAGNOSIS — R0602 Shortness of breath: Secondary | ICD-10-CM | POA: Diagnosis present

## 2015-09-20 LAB — I-STAT TROPONIN, ED: TROPONIN I, POC: 0.01 ng/mL (ref 0.00–0.08)

## 2015-09-20 LAB — BASIC METABOLIC PANEL
Anion gap: 8 (ref 5–15)
BUN: 15 mg/dL (ref 6–20)
CALCIUM: 8.1 mg/dL — AB (ref 8.9–10.3)
CO2: 29 mmol/L (ref 22–32)
CREATININE: 0.65 mg/dL (ref 0.44–1.00)
Chloride: 101 mmol/L (ref 101–111)
GFR calc non Af Amer: >60 mL/min (ref >60)
GLUCOSE: 98 mg/dL (ref 65–99)
Potassium: 3.9 mmol/L (ref 3.5–5.1)
Sodium: 138 mmol/L (ref 135–145)

## 2015-09-20 LAB — CBC
HCT: 31.1 % — ABNORMAL LOW (ref 36.0–46.0)
HCT: 30.4 % — ABNORMAL LOW (ref 36.0–46.0)
HEMOGLOBIN: 9.6 g/dL — AB (ref 12.0–15.0)
Hemoglobin: 8.9 g/dL — ABNORMAL LOW (ref 12.0–15.0)
MCH: 24.9 pg — ABNORMAL LOW (ref 26.0–34.0)
MCH: 25.1 pg — AB (ref 26.0–34.0)
MCHC: 30.9 g/dL (ref 30.0–36.0)
MCHC: 29.3 g/dL — AB (ref 30.0–36.0)
MCV: 80.8 fL (ref 78.0–100.0)
MCV: 85.6 fL (ref 78.0–100.0)
MPV: 8.2 fL — AB (ref 8.6–12.4)
PLATELETS: 146 10*3/uL — AB (ref 150–400)
Platelets: 179 10*3/uL (ref 150–400)
RBC: 3.85 MIL/uL — AB (ref 3.87–5.11)
RBC: 3.55 MIL/uL — ABNORMAL LOW (ref 3.87–5.11)
RDW: 26.3 % — ABNORMAL HIGH (ref 11.5–15.5)
RDW: 27.6 % — AB (ref 11.5–15.5)
WBC: 4.2 10*3/uL (ref 4.0–10.5)
WBC: 5.7 10*3/uL (ref 4.0–10.5)

## 2015-09-20 LAB — BRAIN NATRIURETIC PEPTIDE
B Natriuretic Peptide: 147.1 pg/mL — ABNORMAL HIGH (ref 0.0–100.0)
Brain Natriuretic Peptide: 146.4 pg/mL — ABNORMAL HIGH (ref 0.0–100.0)

## 2015-09-20 LAB — PROTIME-INR
INR: 1.09 (ref <1.50)
Prothrombin Time: 14.2 seconds (ref 11.6–15.2)

## 2015-09-20 MED ORDER — HEPARIN (PORCINE) IN NACL 100-0.45 UNIT/ML-% IJ SOLN: 650.0000 [IU]/h | INTRAMUSCULAR | Status: DC

## 2015-09-20 MED ORDER — DEXTROSE-NACL 5-0.9 % IV SOLN
INTRAVENOUS | Status: DC
  Administered 2015-09-21 (×2): via INTRAVENOUS

## 2015-09-20 MED ORDER — PANTOPRAZOLE SODIUM 40 MG PO TBEC: 40.0000 mg | DELAYED_RELEASE_TABLET | Freq: Every day | ORAL | Status: DC

## 2015-09-20 MED ORDER — SODIUM CHLORIDE 0.9 % IV SOLN: 250.0000 mL | INTRAVENOUS | Status: DC | PRN

## 2015-09-20 MED ORDER — METHOTREXATE 2.5 MG PO TABS: 2.5000 mg | ORAL_TABLET | ORAL | Status: DC

## 2015-09-20 MED ORDER — FERROUS SULFATE 325 (65 FE) MG PO TABS
325.0000 mg | ORAL_TABLET | Freq: Three times a day (TID) | ORAL | Status: DC
  Administered 2015-09-21 (×3): 325 mg via ORAL
  Filled 2015-09-20: qty 1

## 2015-09-20 MED ORDER — SODIUM CHLORIDE 0.9 % IJ SOLN
3.0000 mL | Freq: Two times a day (BID) | INTRAMUSCULAR | Status: DC
  Administered 2015-09-21 – 2015-09-25 (×7): 3 mL via INTRAVENOUS

## 2015-09-20 MED ORDER — SIMETHICONE 80 MG PO CHEW
80.0000 mg | CHEWABLE_TABLET | Freq: Three times a day (TID) | ORAL | Status: DC
  Administered 2015-09-21 – 2015-09-25 (×16): 80 mg via ORAL
  Filled 2015-09-20 (×16): qty 1

## 2015-09-20 MED ORDER — POLYETHYLENE GLYCOL 3350 17 G PO PACK: 17.0000 g | PACK | Freq: Every day | ORAL | Status: DC | PRN

## 2015-09-20 MED ORDER — ENOXAPARIN SODIUM 60 MG/0.6ML ~~LOC~~ SOLN
1.0000 mg/kg | Freq: Once | SUBCUTANEOUS | Status: AC
  Administered 2015-09-20: 45 mg via SUBCUTANEOUS
  Filled 2015-09-20: qty 0.6

## 2015-09-20 MED ORDER — SODIUM CHLORIDE 0.9 % IJ SOLN: 3.0000 mL | INTRAMUSCULAR | Status: DC | PRN

## 2015-09-20 MED ORDER — DOCUSATE SODIUM 283 MG RE ENEM
1.0000 | ENEMA | Freq: Every day | RECTAL | Status: DC
  Filled 2015-09-20: qty 1

## 2015-09-20 MED ORDER — IOHEXOL 350 MG/ML SOLN
80.0000 mL | Freq: Once | INTRAVENOUS | Status: AC | PRN
  Administered 2015-09-20: 80 mL via INTRAVENOUS

## 2015-09-20 MED ORDER — HYDROCODONE-ACETAMINOPHEN 10-325 MG PO TABS
1.0000 | ORAL_TABLET | Freq: Three times a day (TID) | ORAL | Status: AC | PRN
Start: 2015-09-20 — End: ?

## 2015-09-20 MED ORDER — HYDROCODONE-ACETAMINOPHEN 10-325 MG PO TABS
1.0000 | ORAL_TABLET | Freq: Three times a day (TID) | ORAL | Status: DC | PRN
  Administered 2015-09-21: 1 via ORAL
  Filled 2015-09-20: qty 1

## 2015-09-20 MED ORDER — PANTOPRAZOLE SODIUM 40 MG IV SOLR
40.0000 mg | Freq: Two times a day (BID) | INTRAVENOUS | Status: DC
  Administered 2015-09-21 (×2): 40 mg via INTRAVENOUS
  Filled 2015-09-20 (×2): qty 40

## 2015-09-20 MED ORDER — ENOXAPARIN SODIUM 60 MG/0.6ML ~~LOC~~ SOLN: 1.0000 mg/kg | Freq: Two times a day (BID) | SUBCUTANEOUS | Status: DC

## 2015-09-20 NOTE — Telephone Encounter (Signed)
Yes. [facility] ED.

## 2015-09-20 NOTE — Telephone Encounter (Signed)
Patient's son was going to let his sister know that Rx is ready for pick up.  Derl Barrow, RN

## 2015-09-20 NOTE — ED Notes (Signed)
Pt reports swelling to legs for over a week, more severe in left leg. Also had recent difficult eating/swallowing due to feeling like food gets lodged in throat and lower abd pain. Reports mild sob. ekg done at triage, spo2 94%.

## 2015-09-20 NOTE — Progress Notes (Signed)
ANTICOAGULATION CONSULT NOTE - Initial Consult  Pharmacy Consult for heparin Indication: DVT  Allergies  Allergen Reactions  . Fosamax [Alendronate Sodium]     It affected my IBS    Patient Measurements: Height: 5' (152.4 cm) Weight: 100 lb 1.6 oz (45.405 kg) IBW/kg (Calculated) : 45.5  Vital Signs: Temp: 98.9 F (37.2 C) (10/26 2217) Temp Source: Oral (10/26 2217) BP: 116/82 mmHg (10/26 2217) Pulse Rate: 90 (10/26 2217)  Labs:  Recent Labs  09/19/15 1445 09/19/15 1446 09/20/15 1616  HGB  --  9.6* 8.9*  HCT  --  31.1* 30.4*  PLT  --  179 146*  LABPROT 14.2  --   --   INR 1.09  --   --   CREATININE  --  0.58* 0.65    Estimated Creatinine Clearance: 34.2 mL/min (by C-G formula based on Cr of 0.65).   Medical History: Past Medical History  Diagnosis Date  . Rheumatoid arthritis(714.0)   . IBS (irritable bowel syndrome)   . Constipation   . Allergy     seasonal  . Osteoporosis   . Sciatica   . Peripheral vascular disease ([place])     "beyond help"  . Pneumonia 05/25/12; 2010    Medications:  Prescriptions prior to admission  Medication Sig Dispense Refill Last Dose  . fesoterodine (TOVIAZ) 8 MG TB24 tablet Take 8 mg by mouth daily.   09/20/2015 at Unknown time  . folic acid (FOLVITE) 1 MG tablet Take 1 mg by mouth daily.   unknown at [place]  . methotrexate (RHEUMATREX) 2.5 MG tablet Take 2.5 mg by mouth once a week. Caution:Chemotherapy. Protect from light. Take on Fridays   Past Week at Unknown time  . polyethylene glycol (MIRALAX / GLYCOLAX) packet Take 17 g by mouth daily as needed for mild constipation.   Past Week at Unknown time  . docusate sodium (ENEMEEZ) 283 MG enema Place 1 enema (283 mg total) rectally daily. (Patient not taking: Reported on 09/20/2015) 10 each 0 Not Taking at Unknown time  . ferrous sulfate 325 (65 FE) MG tablet Take 1 tablet (325 mg total) by mouth 3 (three) times daily with meals. (Patient not taking: Reported on 09/20/2015)  3 Not  Taking at Unknown time  . HYDROcodone-acetaminophen (NORCO) 10-325 MG tablet Take 1 tablet by mouth every 8 (eight) hours as needed for moderate pain. (Patient not taking: Reported on 09/20/2015) 270 tablet 0 Not Taking at Unknown time  . magnesium citrate SOLN Take 148 mLs (0.5 Bottles total) by mouth once. (Patient not taking: Reported on 09/20/2015) 195 mL 1 Not Taking at Unknown time  . omeprazole (PRILOSEC) 40 MG capsule Take 1 capsule (40 mg total) by mouth daily. (Patient not taking: Reported on 09/20/2015) 90 capsule 3 Not Taking at Unknown time  . ondansetron (ZOFRAN) 4 MG tablet Take 1 tablet (4 mg total) by mouth every 8 (eight) hours as needed for nausea or vomiting. (Patient not taking: Reported on 09/20/2015) 30 tablet 0 Not Taking at Unknown time  . Simethicone 80 MG TABS Take 1 tablet (80 mg total) by mouth 4 (four) times daily -  before meals and at bedtime. (Patient not taking: Reported on 09/20/2015) 60 tablet 0 Not Taking at Unknown time  . tolterodine (DETROL LA) 2 MG 24 hr capsule Take 1 capsule (2 mg total) by mouth daily. (Patient not taking: Reported on 09/20/2015) 30 capsule 6 Not Taking at Unknown time   Scheduled:  . [START ON 09/21/2015] docusate sodium  1 enema Rectal Daily  . [START ON 09/21/2015] ferrous sulfate  325 mg Oral TID WC  . methotrexate  2.5 mg Oral Weekly  . pantoprazole (PROTONIX) IV  40 mg Intravenous Q12H  . [START ON 09/21/2015] simethicone  80 mg Oral TID AC & HS  . sodium chloride  3 mL Intravenous Q12H   Infusions:  . dextrose 5 % and 0.9% NaCl      Assessment: 81yo female c/o BLE swelling worse in LLE as well as difficulty swallowing and mild SOB, exam reveals large LLE DVT though CT is negative for PE, to transition from [place] given in ED to heparin.  Goal of Therapy:  Heparin level 0.3-0.7 units/ml Monitor platelets by anticoagulation protocol: Yes   Plan:  Rec'd full-dose Lovenox at 2100; will give heparin gtt at 0900 at rate of 650  units/hr and monitor heparin levels and CBC.  Wynona Neat, PharmD, BCPS  09/20/2015,11:50 PM

## 2015-09-20 NOTE — Telephone Encounter (Signed)
Spoke with the son who will relay this information to the daughter.

## 2015-09-20 NOTE — Telephone Encounter (Signed)
Pt daughter calling and states that she had to bring in the pt yesterday to the office and she has an appt next week with Dr. Gerlean Ren to f/u for this appt and she states that it would be hard to bring the pt back again today. Pt's daughter would like to request a refill of hydrocodone for the pt to get her through until she can come to see her physician when he is available. She states that the pt has one more week left of medication. Thank you, Fonda Kinder, ASA

## 2015-09-20 NOTE — Progress Notes (Signed)
ANTICOAGULATION CONSULT NOTE - Initial Consult  Pharmacy Consult for Lovenox Indication: DVT  Allergies  Allergen Reactions  . Fosamax [Alendronate Sodium]     It affected my IBS    Patient Measurements: Height: 5' (152.4 cm) Weight: 100 lb 14.4 oz (45.768 kg) IBW/kg (Calculated) : 45.5 Heparin Dosing Weight:   Vital Signs: Temp: 98.6 F (37 C) (10/26 1545) Temp Source: Oral (10/26 1545) BP: 120/59 mmHg (10/26 2030) Pulse Rate: 91 (10/26 2030)  Labs:  Recent Labs  09/19/15 1445 09/19/15 1446 09/20/15 1616  HGB  --  9.6* 8.9*  HCT  --  31.1* 30.4*  PLT  --  179 146*  LABPROT 14.2  --   --   INR 1.09  --   --   CREATININE  --  0.58* 0.65    Estimated Creatinine Clearance: 34.2 mL/min (by C-G formula based on Cr of 0.65).   Medical History: Past Medical History  Diagnosis Date  . Rheumatoid arthritis(714.0)   . IBS (irritable bowel syndrome)   . Constipation   . Allergy     seasonal  . Osteoporosis   . Sciatica   . Peripheral vascular disease ([place])     "beyond help"  . Pneumonia 05/25/12; 2010    Medications:   (Not in a hospital admission) Scheduled:  Infusions:   Assessment: 81yo female with history of RA, PVD, IBS and osteoporosis presents with leg swelling and SOB.  Pharmacy is consulted to dose lovenox for DVT. Venous duplex revealed DVT in LLE from CFV through PTV and peroneal vein and superficial thrombosis in saphenofemoral junction. Hgb 8.9, Plt 146, sCr 0.65.  Goal of Therapy:  DVT treatment Monitor platelets by anticoagulation protocol: Yes   Plan:  Lovenox 1mg /kg subcutaneously q12h Monitor s/sx of bleeding F/u on oral anticoag  Andrey Cota. Diona Foley, PharmD Clinical Pharmacist Pager [phone] 09/20/2015,8:56 PM

## 2015-09-20 NOTE — Progress Notes (Signed)
VASCULAR LAB PRELIMINARY  PRELIMINARY  PRELIMINARY  PRELIMINARY  Bilateral lower extremity venous duplex  completed.    Preliminary report:  Right:  No evidence of DVT or superficial thrombosis.  Large structure in the popliteal fossa with mixed echoes.  Probable Baker's cyst.  Left: DVT noted in from the CFV through the PTV and peroneal v.  Superficial thrombosis in the saphenofemoral junction.  No Baker's cyst.   Alli Jasmer, RVT 09/20/2015, 7:20 PM

## 2015-09-20 NOTE — Telephone Encounter (Signed)
Doc of the Day Routine referral for an acute issue of vomiting "dark stuff", frequent nausea and difficulty swallowing. Please see the PCP note. We do not have earlier appointments. Should I send her to the ER?

## 2015-09-20 NOTE — ED Provider Notes (Addendum)
CSN: [id number]     Arrival date & time 09/20/15  1525 History   First MD Initiated Contact with Patient 09/20/15 1713     Chief Complaint  Patient presents with  . Shortness of Breath  . Leg Swelling     (Consider location/radiation/quality/duration/timing/severity/associated sxs/prior Treatment) HPI   Patient is an 81 year old female presenting today with multiple complaints.  Patient seen yesterday at family practice clinic. She was referred here today by family practice because it were unable to schedule an outpatient GI follow-up soon. She reports she had 2 episodes of black emesis yesterday. She's had none since then. No nausea. Patient had mild epigastric pain for last couple weeks. She's been off her PPI.  In addition patient was seen for leg swelling. Patient left leg appears much more swollen than right leg. Patient also been having shortness of breath and is at 92% on room air. Patient not previously on any diuretics.  She was third complaint is that she's having trouble swallowing. Occasionally happens with  solid and liquids. Concern for esophageal pathology.  Past Medical History  Diagnosis Date  . Rheumatoid arthritis(714.0)   . IBS (irritable bowel syndrome)   . Constipation   . Allergy     seasonal  . Osteoporosis   . Sciatica   . Peripheral vascular disease ([place])     "beyond help"  . Pneumonia 05/25/12; 2010   Past Surgical History  Procedure Laterality Date  . Replacement total knee  ~ 2010    left  . Facial cosmetic surgery      left cheek  . Rotator cuff repair      right  . Bunionectomy  1990's    bilateral  . Joint replacement    . Partial hip arthroplasty  05/26/12    right  . Cataract extraction w/ intraocular lens  implant, bilateral  ~ 2003  . Eye surgery  ~ 2003    right; "precancerous; took out section in lower part of eye"  . Hip arthroplasty  05/26/2012    Procedure: ARTHROPLASTY BIPOLAR HIP;  Surgeon: Hessie Dibble, MD;  Location: [facility];   Service: Orthopedics;  Laterality: Right;   Family History  Problem Relation Age of Onset  . Lung cancer Sister   . Uterine cancer Sister   . Colon cancer Neg Hx   . Heart disease Father   . GI problems Son   . Irritable bowel syndrome Son    Social History  Substance Use Topics  . Smoking status: Former Smoker -- 0.10 packs/day for 50 years    Types: Cigarettes    Quit date: 11/26/1987  . Smokeless tobacco: Never Used  . Alcohol Use: No   OB History    No data available     Review of Systems  Constitutional: Positive for appetite change and fatigue. Negative for activity change.  HENT: Positive for trouble swallowing. Negative for congestion.   Eyes: Negative for discharge.  Respiratory: Positive for chest tightness and shortness of breath. Negative for cough.   Cardiovascular: Positive for leg swelling. Negative for chest pain.  Gastrointestinal: Positive for vomiting and abdominal pain. Negative for abdominal distention.  Genitourinary: Negative for dysuria and difficulty urinating.  Musculoskeletal: Negative for joint swelling.  Skin: Negative for rash.  Allergic/Immunologic: Negative for immunocompromised state.  Neurological: Negative for seizures.  Psychiatric/Behavioral: Negative for behavioral problems.      Allergies  Fosamax  Home Medications   Prior to Admission medications   Medication Sig Start  Date End Date Taking? Authorizing Provider  fesoterodine (TOVIAZ) 8 MG TB24 tablet Take 8 mg by mouth daily.   Yes Historical Provider, MD  folic acid (FOLVITE) 1 MG tablet Take 1 mg by mouth daily.   Yes Historical Provider, MD  methotrexate (RHEUMATREX) 2.5 MG tablet Take 2.5 mg by mouth once a week. Caution:Chemotherapy. Protect from light. Take on Fridays   Yes Historical Provider, MD  polyethylene glycol (MIRALAX / GLYCOLAX) packet Take 17 g by mouth daily as needed for mild constipation.   Yes Historical Provider, MD  docusate sodium (ENEMEEZ) 283 MG enema  Place 1 enema (283 mg total) rectally daily. Patient not taking: Reported on 09/20/2015 09/06/15   Archie Patten, MD  ferrous sulfate 325 (65 FE) MG tablet Take 1 tablet (325 mg total) by mouth 3 (three) times daily with meals. Patient not taking: Reported on 09/20/2015 09/19/15   Vivi Barrack, MD  HYDROcodone-acetaminophen [facility]) 10-325 MG tablet Take 1 tablet by mouth every 8 (eight) hours as needed for moderate pain. Patient not taking: Reported on 09/20/2015 09/20/15   Zenia Resides, MD  magnesium citrate SOLN Take 148 mLs (0.5 Bottles total) by mouth once. Patient not taking: Reported on 09/20/2015 09/06/15   Archie Patten, MD  omeprazole (PRILOSEC) 40 MG capsule Take 1 capsule (40 mg total) by mouth daily. Patient not taking: Reported on 09/20/2015 09/19/15   Vivi Barrack, MD  ondansetron (ZOFRAN) 4 MG tablet Take 1 tablet (4 mg total) by mouth every 8 (eight) hours as needed for nausea or vomiting. Patient not taking: Reported on 09/20/2015 09/19/15   Vivi Barrack, MD  Simethicone 80 MG TABS Take 1 tablet (80 mg total) by mouth 4 (four) times daily -  before meals and at bedtime. Patient not taking: Reported on 09/20/2015 09/06/15   Archie Patten, MD  tolterodine (DETROL LA) 2 MG 24 hr capsule Take 1 capsule (2 mg total) by mouth daily. Patient not taking: Reported on 09/20/2015 09/06/15   Archie Patten, MD   BP 120/59 mmHg  Pulse 91  Temp(Src) 98.6 F (37 C) (Oral)  Resp 16  Ht 5' (1.524 m)  Wt 100 lb 14.4 oz (45.768 kg)  BMI 19.71 kg/m2  SpO2 95% Physical Exam  Constitutional: She is oriented to person, place, and time. She appears well-developed and well-nourished.  Very thin white female  HENT:  Head: Normocephalic and atraumatic.  Eyes: Conjunctivae are normal. Right eye exhibits no discharge.  Neck: Neck supple.  Cardiovascular: Normal rate and normal heart sounds.   No murmur heard. Pulmonary/Chest: Breath sounds normal. She has no wheezes. She  has no rales.  Mild tachypnea  Abdominal: Soft. She exhibits no distension. There is no tenderness.  Musculoskeletal: Normal range of motion. She exhibits edema.  Leg significant significantly larger than right leg.  Neurological: She is oriented to person, place, and time. No cranial nerve deficit.  Skin: Skin is warm and dry. No rash noted. She is not diaphoretic.  Psychiatric: Her behavior is normal.  Nursing note and vitals reviewed.   ED Course  Procedures (including critical care time) Labs Review Labs Reviewed  BASIC METABOLIC PANEL - Abnormal; Notable for the following:    Calcium 8.1 (*)    All other components within normal limits  CBC - Abnormal; Notable for the following:    RBC 3.55 (*)    Hemoglobin 8.9 (*)    HCT 30.4 (*)    MCH 25.1 (*)  MCHC 29.3 (*)    RDW 27.6 (*)    Platelets 146 (*)    All other components within normal limits  BRAIN NATRIURETIC PEPTIDE - Abnormal; Notable for the following:    B Natriuretic Peptide 147.1 (*)    All other components within normal limits  I-STAT TROPOININ, ED    Imaging Review Dg Chest 2 View  09/20/2015  CLINICAL DATA:  Two 3 week history of mid sternal chest pain associated with shortness of breath and difficulty swallowing; history of previous episodes of pneumonia, former smoker EXAM: CHEST  2 VIEW COMPARISON:  PA and lateral chest x-ray of October 14, 2013 FINDINGS: The lungs are adequately inflated. There are chronically increased interstitial markings bilaterally especially at the left lung base. There is no alveolar infiltrate or pleural effusion. The cardiac silhouette is mildly enlarged. The pulmonary vascularity is not engorged. There is no pneumothorax. The mediastinum is normal in width. There is moderate thoracolumbar dextroscoliosis. There are multiple compression fractures of mid and lower thoracic vertebral bodies. As best as can be determined these are not entirely new. There is moderate to severe  degenerative change of both shoulders. IMPRESSION: COPD and chronic pulmonary fibrotic changes. Acute bronchitis may be present. There is no alveolar pneumonia nor evidence of pulmonary edema. There is stable mild cardiomegaly. Electronically Signed   By: David  Martinique M.D.   On: 09/20/2015 16:58   Ct Angio Chest Pe W/cm &/or Wo Cm  09/20/2015  CLINICAL DATA:  Shortness of breath and hypoxia for several weeks. Unilateral left leg swelling. Clinical suspicion for pulmonary embolism. EXAM: CT ANGIOGRAPHY CHEST WITH CONTRAST TECHNIQUE: Multidetector CT imaging of the chest was performed using the standard protocol during bolus administration of intravenous contrast. Multiplanar CT image reconstructions and MIPs were obtained to evaluate the vascular anatomy. CONTRAST:  110mL OMNIPAQUE IOHEXOL 350 MG/ML SOLN COMPARISON:  None. FINDINGS: Mediastinum/Lymph Nodes: No pulmonary emboli or thoracic aortic dissection identified. Enlarged central pulmonary arteries is suggestive of pulmonary arterial hypertension. Mild to moderate cardiomegaly noted, without evidence of pericardial effusion. No masses or pathologically enlarged lymph nodes identified. Lungs/Pleura: Moderate to severe emphysema noted. Bilateral pleural- parenchymal scarring demonstrated. No evidence of pulmonary consolidation or mass. No evidence of pleural effusion. Upper abdomen: No acute findings. Musculoskeletal: No chest wall mass or suspicious bone lesions identified. Several chronic appearing thoracic and lumbar vertebral compression fracture deformities noted. Review of the MIP images confirms the above findings. IMPRESSION: No evidence of pulmonary embolism or other acute findings. Moderate severe emphysema and bilateral pleural-parenchymal scarring. Enlarged central pulmonary arteries, suspicious for pulmonary arterial hypertension. Electronically Signed   By: Earle Gell M.D.   On: 09/20/2015 19:02   I have personally reviewed and evaluated these  images and lab results as part of my medical decision-making.   EKG Interpretation   Date/Time:  Wednesday September 20 2015 15:45:02 EDT Ventricular Rate:  87 PR Interval:  184 QRS Duration: 120 QT Interval:  382 QTC Calculation: 459 R Axis:   103 Text Interpretation:  Normal sinus rhythm Rightward axis Non-specific  intra-ventricular conduction delay ST \\T \ T wave abnormality, consider  inferior ischemia ST \\T \ T wave abnormality, consider anterior ischemia  Abnormal ECG No significant change since last tracing Confirmed by  Gerald Leitz ([id number]) on 09/20/2015 6:13:20 PM      MDM   Final diagnoses:  None    Patient is an 81 year old female sent here from family practice for admission. Patient has 3 acute issues today.  The  primary issue she was sent here by family practice was because she had 2 episodes of black vomit yesterday. They're concerned about an upper GI bleed. They started her back on her PPI. But unable to schedule GI quickly as an outpatient. Patient's had no other vomiting since then. No black tarry stools. No diarrhea. Vital signs stable and no HgB drop.  The second issue is her leg swelling. Unconcerned about this given that her left leg is much more swollen than her right leg. Concern for pulmonary embolism and DVT in this female. She is mildly hypoxic with shortness of breath. We will get a CT pulmonary embolism.  The third issue today is her difficulty swallowing. I'm concerned that this could represent an esophageal neoplasm. We will get a CT PE and hopefully be able to see any kind of large tumor burden. In addition to her admission for a scope we will be able to facilitate this workup.  8:56 PM DVT in left leg. We will treat with low weight left or heparin. Concerned because patient had symptoms of GI bleeding history. However given the large clot and proximal nature of it, risk of palpation outweighs risk of potential GI bleed. Will admit.      Maat Kafer Julio Alm, MD 09/20/15 2055  Jerric Oyen Julio Alm, MD 09/20/15 2056

## 2015-09-20 NOTE — H&P (Signed)
[facility] Admission History and Physical Service Pager: [phone]  Patient name: Caroline Lewis Medical record number: [id number] Date of birth: 1924/05/30 Age: 81 y.o. Gender: female  Primary Care Provider: Zigmund Gottron, MD Consultants: None Code Status: DNR  Chief Complaint: LE swelling, dark emesis  Assessment and Plan: COLANDRA OHANIAN is a 81 y.o. female presenting with LE swelling, black emesis . PMH is significant for IBS, GERD, OA,   DVT- DVT from the left common femoral vein to the peritoneal vein. While she is ambulatory she admits to walking a limited amount which places her at risk for DVT. Additionally she has had new post menopausal bleeding cocerning for endometrial dysplasia as well as dysphagia over at least 6 months neither of which have been completely evaluated to rule out malignancy. There was initial concern in the ED that she had SOB so she had a CTA to rule out PE which was negative. Although there is concern for GI bleed, hgb is stable with normal VS and no reccurrent episodes of dark emesis or development of hematochezia making active GI bleeding less likely.  - admit to teaching service, attending Dr Ree Kida - treatment dose heparin per pharmacy - will consider transition to Lovenox vs DOAC once stable  Concern for GI bleed- No repeat dark emesis or development of hematochezia. Hgb in clinic 9.6, and 8.9 today, stable.  - GI consult in AM - f/u AM CBC - monitor for bleeds - NPO except meds - IV protonix BID - D5 NS MIVF  Dysphasia- Concern given her longstanding history of GERD and no documented evaluation - Will discuss evaluataion by GI in AM - SLP for possible barium swallow once upper GI bleed ruled out  Elevated TSH- TSH 5.27 on 10/25. Given age, this may be appropriate given age adjusted TSH reference rages (1, [place] and [place]). However LE edema may be related to hypothyroid - Will collect Free  T3/T4  Concern for CHF- BNP elevated 147 although no baseline in [facility] system, with chronic LE edema, concerning for cardiac etiology. Pt denies SOB although O2 sats in mid 90s on RA. CXR neg for Pulm edema with normal lung exam. CT PE did show enlarged pulm arteries which could be an indicator for PAH - consider echo in AM  Post menopausal bleeding- No bleeding at this time with stable Hgb and VS. However, history of vaginal bleeding it is concerning for endometrial dysplasia versus cancer and should be evaluated - pelvic exam while inpatient -  Consider pelvic US  Anemia of Chronic Disease- stable - continue ferrous sulfate as outpatient  Chronic pain from OA - will continue home Norco  Constipation- significant history of constipation which has been documented as very severe. She reports it has been stable on current home regimen - on colace enema daily at home, will continue  Rheumatoid Arthritis - continue home methotrexate  FEN/GI: NPO except meds, D5NS MIVF Prophylaxis: heparin treatment dose per pharm  Disposition: Admit for further eval, Attending Dr Ree Kida  History of Present Illness:  Caroline Lewis is a 81 y.o. female presenting with LE swelling worse on the left, difficulty swallowing solids for approximately 3 weeks, recent dark emesis yesterday (10/25) early AM x2.   She has chronic LE swelling worse in her feet but approximately 1 week ago Caroline Lewis noticed that she had pain and worsening swelling in her left leg. The swelling extended to her knee. The pain lasted for a 'short time" but  the swelling remained. She denies SOB, chest pain, She does not typically walk far distances but is ambulatory.  She additionally had 2 episodes of very dark emesis yesterday (10/25) early AM. She denies lightheadedness, dizziness, passing out,  dark stool or bright red stool per rectum. She denies bright red emesis. She had a bowel movement, soft in consistency and brown in color.  She does have a significant history of GERD but denies a history of peptic ulcer disease, She does have chronic abdominal pain and she feels this has been at her baseline. She has tolerated PO but has increasingly been eating less due to difficulty swallowing  She reports a 3 week history of worsening difficulty swallowing solids more so than liquids. She was seen in [facility] medicine clinic for this in 02/2015. She feels as if her food " gets stuck" in the middle of her chest. Denies sensation of globus  She denies nausea, recent illness, fevers, chills, has occasional cough with mucous but this is stable at her baseline She has a history of vaginal bleeding but has not had any vaginal bleeding recently but it has been on going intermittently over the last several months   Review Of Systems: Per HPI Otherwise the remainder of the systems were negative.  Patient Active Problem List   Diagnosis Date Noted  . Dark emesis 09/19/2015  . Post-menopausal bleeding 09/19/2015  . Bilateral lower extremity edema 09/19/2015  . Dysphagia 03/24/2015  . Encounter for chronic pain management 11/11/2014  . Seborrheic keratoses 09/29/2014  . Protein-calorie malnutrition ([place]) 07/01/2014  . Irritable bowel syndrome 07/01/2014  . Osteoarthritis of right knee 07/01/2014  . Gastroesophageal reflux disease 04/08/2014  . At high risk for falls 04/08/2014  . Fracture of femoral neck, left, closed 02/19/2013  . Low back pain 09/11/2012  . Actinic keratitis 08/13/2012  . Screening cholesterol level 07/10/2012  . Anemia of chronic disease 05/30/2012  . S/P hip replacement 05/25/2012  . Osteoporosis 05/25/2012  . Urge incontinence 10/11/2011  . Rheumatoid arthritis ([place]) 05/31/2011  . Constipation 03/06/2011    Past Medical History: Past Medical History  Diagnosis Date  . Rheumatoid arthritis(714.0)   . IBS (irritable bowel syndrome)   . Constipation   . Allergy     seasonal  . Osteoporosis   . Sciatica    . Peripheral vascular disease ([place])     "beyond help"  . Pneumonia 05/25/12; 2010    Past Surgical History: Past Surgical History  Procedure Laterality Date  . Replacement total knee  ~ 2010    left  . Facial cosmetic surgery      left cheek  . Rotator cuff repair      right  . Bunionectomy  1990's    bilateral  . Joint replacement    . Partial hip arthroplasty  05/26/12    right  . Cataract extraction w/ intraocular lens  implant, bilateral  ~ 2003  . Eye surgery  ~ 2003    right; "precancerous; took out section in lower part of eye"  . Hip arthroplasty  05/26/2012    Procedure: ARTHROPLASTY BIPOLAR HIP;  Surgeon: Hessie Dibble, MD;  Location: [place];  Service: Orthopedics;  Laterality: Right;    Social History: Social History  Substance Use Topics  . Smoking status: Former Smoker -- 0.10 packs/day for 50 years    Types: Cigarettes    Quit date: 11/26/1987  . Smokeless tobacco: Never Used  . Alcohol Use: No   Additional social  history: remote history of smoking, denies etoh, use, denies drug use, Lives with son who manages finances, shopping, cleaning for her. She is able to dress her self and perform toileting functions Please also refer to relevant sections of EMR.  Family History: Family History  Problem Relation Age of Onset  . Lung cancer Sister   . Uterine cancer Sister   . Colon cancer Neg Hx   . Heart disease Father   . GI problems Son   . Irritable bowel syndrome Son      Allergies and Medications: Allergies  Allergen Reactions  . Fosamax [Alendronate Sodium]     It affected my IBS   No current facility-administered medications on file prior to encounter.   Current Outpatient Prescriptions on File Prior to Encounter  Medication Sig Dispense Refill  . methotrexate (RHEUMATREX) 2.5 MG tablet Take 2.5 mg by mouth once a week. Caution:Chemotherapy. Protect from light. Take on Fridays    . polyethylene glycol (MIRALAX / GLYCOLAX) packet Take 17 g by  mouth daily as needed for mild constipation.    . docusate sodium (ENEMEEZ) 283 MG enema Place 1 enema (283 mg total) rectally daily. (Patient not taking: Reported on 09/20/2015) 10 each 0  . ferrous sulfate 325 (65 FE) MG tablet Take 1 tablet (325 mg total) by mouth 3 (three) times daily with meals. (Patient not taking: Reported on 09/20/2015)  3  . HYDROcodone-acetaminophen (NORCO) 10-325 MG tablet Take 1 tablet by mouth every 8 (eight) hours as needed for moderate pain. (Patient not taking: Reported on 09/20/2015) 270 tablet 0  . magnesium citrate SOLN Take 148 mLs (0.5 Bottles total) by mouth once. (Patient not taking: Reported on 09/20/2015) 195 mL 1  . omeprazole (PRILOSEC) 40 MG capsule Take 1 capsule (40 mg total) by mouth daily. (Patient not taking: Reported on 09/20/2015) 90 capsule 3  . ondansetron (ZOFRAN) 4 MG tablet Take 1 tablet (4 mg total) by mouth every 8 (eight) hours as needed for nausea or vomiting. (Patient not taking: Reported on 09/20/2015) 30 tablet 0  . Simethicone 80 MG TABS Take 1 tablet (80 mg total) by mouth 4 (four) times daily -  before meals and at bedtime. (Patient not taking: Reported on 09/20/2015) 60 tablet 0  . tolterodine (DETROL LA) 2 MG 24 hr capsule Take 1 capsule (2 mg total) by mouth daily. (Patient not taking: Reported on 09/20/2015) 30 capsule 6    Objective: BP 120/59 mmHg  Pulse 91  Temp(Src) 98.6 F (37 C) (Oral)  Resp 16  Ht 5' (1.524 m)  Wt 100 lb 14.4 oz (45.768 kg)  BMI 19.71 kg/m2  SpO2 95% Exam: General: NAD lying in bed HEENT: PERRL, EOMI, MMM, NCAT Cardiovascular: RRR, no murmurs auscultated Respiratory: CTAB, no wheezes or crackles Abdomen: soft, mildly tender diffusely, but no rebound or guarding. No palpable masses Extremities: right 2+ pitting foot edema mainly on distal portion over dorsum of foot. LE 2-3+ pitting edema extending to 2-3 cm inferior to the knee. No erythema or calf tenderness bilaterally Skin: no rashes or  lesions noted Neuro: no focal deficits Psych: normal mood and affect  Labs and Imaging: CBC BMET   Recent Labs Lab 09/20/15 1616  WBC 5.7  HGB 8.9*  HCT 30.4*  PLT 146*    Recent Labs Lab 09/20/15 1616  NA 138  K 3.9  CL 101  CO2 29  BUN 15  CREATININE 0.65  GLUCOSE 98  CALCIUM 8.1*     CXR  COPD and chronic pulmonary fibrotic changes. Acute bronchitis may be present. There is no alveolar pneumonia nor evidence of pulmonary edema. There is stable mild cardiomegaly.  CTA  No evidence of pulmonary embolism or other acute findings.  Moderate severe emphysema and bilateral pleural-parenchymal scarring.  Enlarged central pulmonary arteries, suspicious for pulmonary arterial hypertension.   References 1 Surks MI, Hollowell JG Age-specific distribution of serum thyrotropin and antithyroid antibodies in the [place] population: implications for the prevalence of subclinical hypothyroidism. J Clin Endocrinol Metab. 2007;92(12):4575.       Veatrice Bourbon, MD 09/20/2015, 8:51 PM PGY-2, [place] Intern pager: [phone], text pages welcome

## 2015-09-21 ENCOUNTER — Inpatient Hospital Stay (HOSPITAL_COMMUNITY): Payer: Commercial Managed Care - HMO

## 2015-09-21 ENCOUNTER — Inpatient Hospital Stay (HOSPITAL_COMMUNITY): Payer: Commercial Managed Care - HMO | Admitting: Anesthesiology

## 2015-09-21 ENCOUNTER — Encounter (HOSPITAL_COMMUNITY)
Admission: EM | Disposition: A | Discharge status: Home Health | Payer: Self-pay | Source: Home / Self Care | Attending: Family Medicine

## 2015-09-21 ENCOUNTER — Encounter (HOSPITAL_COMMUNITY): Payer: Self-pay | Admitting: *Deleted

## 2015-09-21 DIAGNOSIS — D62 Acute posthemorrhagic anemia: Secondary | ICD-10-CM

## 2015-09-21 DIAGNOSIS — K920 Hematemesis: Secondary | ICD-10-CM

## 2015-09-21 DIAGNOSIS — N950 Postmenopausal bleeding: Secondary | ICD-10-CM | POA: Insufficient documentation

## 2015-09-21 DIAGNOSIS — R110 Nausea: Secondary | ICD-10-CM

## 2015-09-21 DIAGNOSIS — E46 Unspecified protein-calorie malnutrition: Secondary | ICD-10-CM

## 2015-09-21 DIAGNOSIS — I82402 Acute embolism and thrombosis of unspecified deep veins of left lower extremity: Secondary | ICD-10-CM

## 2015-09-21 DIAGNOSIS — R1310 Dysphagia, unspecified: Secondary | ICD-10-CM

## 2015-09-21 DIAGNOSIS — M7989 Other specified soft tissue disorders: Secondary | ICD-10-CM

## 2015-09-21 HISTORY — PX: ESOPHAGOGASTRODUODENOSCOPY: SHX5428

## 2015-09-21 LAB — BASIC METABOLIC PANEL
Anion gap: 5 (ref 5–15)
BUN: 12 mg/dL (ref 6–20)
CHLORIDE: 103 mmol/L (ref 101–111)
CO2: 28 mmol/L (ref 22–32)
CREATININE: 0.54 mg/dL (ref 0.44–1.00)
Calcium: 7.5 mg/dL — ABNORMAL LOW (ref 8.9–10.3)
GFR calc non Af Amer: >60 mL/min (ref >60)
GLUCOSE: 101 mg/dL — AB (ref 65–99)
Potassium: 3.7 mmol/L (ref 3.5–5.1)
Sodium: 136 mmol/L (ref 135–145)

## 2015-09-21 LAB — CBC
HCT: 26 % — ABNORMAL LOW (ref 36.0–46.0)
HEMATOCRIT: 27.7 % — AB (ref 36.0–46.0)
HEMOGLOBIN: 7.6 g/dL — AB (ref 12.0–15.0)
HEMOGLOBIN: 8.4 g/dL — AB (ref 12.0–15.0)
MCH: 24.9 pg — AB (ref 26.0–34.0)
MCH: 26.1 pg (ref 26.0–34.0)
MCHC: 29.2 g/dL — AB (ref 30.0–36.0)
MCHC: 30.3 g/dL (ref 30.0–36.0)
MCV: 85.2 fL (ref 78.0–100.0)
MCV: 86 fL (ref 78.0–100.0)
PLATELETS: 117 10*3/uL — AB (ref 150–400)
Platelets: 115 10*3/uL — ABNORMAL LOW (ref 150–400)
RBC: 3.05 MIL/uL — AB (ref 3.87–5.11)
RBC: 3.22 MIL/uL — ABNORMAL LOW (ref 3.87–5.11)
RDW: 27.5 % — ABNORMAL HIGH (ref 11.5–15.5)
RDW: 27.8 % — ABNORMAL HIGH (ref 11.5–15.5)
WBC: 4.1 10*3/uL (ref 4.0–10.5)
WBC: 4.4 10*3/uL (ref 4.0–10.5)

## 2015-09-21 LAB — T4, FREE: Free T4: 0.79 ng/dL (ref 0.61–1.12)

## 2015-09-21 LAB — HEPARIN LEVEL (UNFRACTIONATED): Heparin Unfractionated: 0.18 IU/mL — ABNORMAL LOW (ref 0.30–0.70)

## 2015-09-21 SURGERY — EGD (ESOPHAGOGASTRODUODENOSCOPY)
Anesthesia: Monitor Anesthesia Care

## 2015-09-21 MED ORDER — SODIUM CHLORIDE 0.9 % IV SOLN
INTRAVENOUS | Status: DC
Start: 2015-09-21 — End: 2015-09-21

## 2015-09-21 MED ORDER — HEPARIN (PORCINE) IN NACL 100-0.45 UNIT/ML-% IJ SOLN
1000.0000 [IU]/h | INTRAMUSCULAR | Status: DC
  Administered 2015-09-21: 650 [IU]/h via INTRAVENOUS
  Administered 2015-09-22: 900 [IU]/h via INTRAVENOUS
  Filled 2015-09-21 (×2): qty 250

## 2015-09-21 MED ORDER — PANTOPRAZOLE SODIUM 40 MG PO TBEC
40.0000 mg | DELAYED_RELEASE_TABLET | Freq: Two times a day (BID) | ORAL | Status: DC
  Administered 2015-09-21 – 2015-09-25 (×8): 40 mg via ORAL
  Filled 2015-09-21 (×9): qty 1

## 2015-09-21 MED ORDER — DOCUSATE SODIUM 100 MG PO CAPS
100.0000 mg | ORAL_CAPSULE | Freq: Two times a day (BID) | ORAL | Status: DC
  Administered 2015-09-21 – 2015-09-22 (×3): 100 mg via ORAL
  Filled 2015-09-21 (×3): qty 1

## 2015-09-21 MED ORDER — LACTATED RINGERS IV SOLN
INTRAVENOUS | Status: DC
  Administered 2015-09-21 (×2): via INTRAVENOUS

## 2015-09-21 MED ORDER — PROPOFOL 10 MG/ML IV BOLUS
INTRAVENOUS | Status: DC | PRN
  Administered 2015-09-21 (×2): 10 mg via INTRAVENOUS
  Administered 2015-09-21: 20 mg via INTRAVENOUS

## 2015-09-21 MED ORDER — LIDOCAINE HCL (CARDIAC) 20 MG/ML IV SOLN
INTRAVENOUS | Status: DC | PRN
  Administered 2015-09-21: 20 mg via INTRAVENOUS

## 2015-09-21 NOTE — Progress Notes (Signed)
[place] for heparin Indication: DVT  Allergies  Allergen Reactions  . Fosamax [Alendronate Sodium]     It affected my IBS    Patient Measurements: Height: 5' (152.4 cm) Weight: 100 lb (45.36 kg) IBW/kg (Calculated) : 45.5  Vital Signs: Temp: 98.5 F (36.9 C) (10/27 1215) Temp Source: Oral (10/27 1215) BP: 100/50 mmHg (10/27 1215) Pulse Rate: 87 (10/27 1215)  Labs:  Recent Labs  09/19/15 1445  09/19/15 1446 09/20/15 1616 09/21/15 0237 09/21/15 1208  HGB  --   < > 9.6* 8.9* 7.6* 8.4*  HCT  --   < > 31.1* 30.4* 26.0* 27.7*  PLT  --   < > 179 146* 117* 115*  LABPROT 14.2  --   --   --   --   --   INR 1.09  --   --   --   --   --   CREATININE  --   --  0.58* 0.65 0.54  --   < > = values in this interval not displayed.  Estimated Creatinine Clearance: 34.2 mL/min (by C-G formula based on Cr of 0.54).   Medical History: Past Medical History  Diagnosis Date  . Rheumatoid arthritis(714.0)   . IBS (irritable bowel syndrome)   . Constipation   . Allergy     seasonal  . Osteoporosis   . Sciatica   . Peripheral vascular disease ([place])     "beyond help"  . Pneumonia 05/25/12; 2010    Assessment: 81yo female with LLE DVT and heparin has been on hold for concern of GIB (Hg was 7.6 this am). He is now s/p endoscopy and noted with severe ulcerative esophagitis but no active bleeding. Pharmacy has been asked to restart heparin  Goal of Therapy:  Heparin level 0.3-0.7 units/ml Monitor platelets by anticoagulation protocol: Yes   Plan:  -Restart heparin at 650 units/hr -Heparin level in 8 hours and daily wth CBC daily  Hildred Laser, Pharm D 09/21/2015 1:46 PM

## 2015-09-21 NOTE — Progress Notes (Signed)
Utilization review completed. Imanie Darrow, RN, BSN. 

## 2015-09-21 NOTE — Anesthesia Procedure Notes (Signed)
Procedure Name: MAC Date/Time: 09/21/2015 11:23 AM Performed by: Neldon Newport Pre-anesthesia Checklist: Timeout performed, Patient being monitored, Suction available, Emergency Drugs available and Patient identified Patient Re-evaluated:Patient Re-evaluated prior to inductionOxygen Delivery Method: Nasal cannula Placement Confirmation: positive ETCO2

## 2015-09-21 NOTE — Progress Notes (Signed)
Family Medicine Teaching Service Daily Progress Note Intern Pager: [phone]  Patient name: Caroline Lewis Medical record number: [id number] Date of birth: 08-04-1924 Age: 81 y.o. Gender: female  Primary Care Provider: Zigmund Gottron, MD Consultants: GI Code Status: DNR  Pt Overview and Major Events to Date:  10/26: admit for concern for hematemesis and new DVT  Assessment and Plan:  Caroline Lewis is a 81 y.o. female presenting with LE swelling, black emesis . PMH is significant for IBS, GERD, OA,   DVT- DVT in setting of concern for GI bleed with decreasing hgb to 7.6 this AM from 8.9 yesterday  - Hold heparin therapy, until GI eval for bleed - will consider transition to Lovenox vs DOAC once stable  Concern for GI bleed- no repeat hematemesis overnight ot hematochezia, however hgb drop to 7.6 from 8.9 concerning or active bleed - GI consult this AM - afternoon CBC - monitor for bleeds - NPO except meds - IV protonix BID - D5 NS MIVF  Dysphasia-  - GI eval - SLP for possible barium swallow once upper GI bleed ruled out  Elevated TSH- TSH 5.27 on 10/25. Given age, this may be appropriate given age adjusted TSH reference rages (1. [place] and [place]). However LE edema may be related to hypothyroidism - Will collect Free T3/T4  Concern for CHF- BNP elevated 147 although no baseline in [facility] system, with chronic LE edema, concerning for cardiac etiology. Pt denies SOB although O2 sats in mid 90s on RA. CXR neg for Pulm edema with mild crackles on exam. CT PE did show enlarged pulm arteries which could be an indicator for PAH - Echo this AM  Post menopausal bleeding- Pelvic exam neg for evidence of active bleeding, atrophic external genitalia increases risk of bleeding from friable external tissues.  - pelvic exam neg for bleeding - monitor bleeding symptoms - Consider pelvic US  Anemia of Chronic Disease- stable - continue ferrous sulfate   Chronic pain from  OA - will continue home Norco  Constipation- significant history of constipation which has been documented as very severe. She reports it has been stable on current home regimen - on colace enema daily at home, will hold in setting of concern for active GI bleed and given oral colace  Rheumatoid Arthritis - hold home methotrexate in setting of anemia, to begin once Hgb >8. Currently takes it weekly  FEN/GI: NPO except meds, D5NS MIVF Prophylaxis: heparin treatment dose per pharm  Disposition: pending clinical improvement  Subjective:  Denies SOB, CP, denies nausea but does report some abd pain not different from baseline Denies vaginal bleeding   Objective: Temp:  [97.5 F (36.4 C)-98.9 F (37.2 C)] 97.5 F (36.4 C) (10/27 0552) Pulse Rate:  [76-92] 76 (10/27 0552) Resp:  [11-20] 16 (10/27 0552) BP: (96-123)/(47-82) 99/57 mmHg (10/27 0552) SpO2:  [88 %-100 %] 96 % (10/27 0552) Weight:  [100 lb 1.6 oz (45.405 kg)-100 lb 14.4 oz (45.768 kg)] 100 lb 4.8 oz (45.496 kg) (10/27 5427) Physical Exam: General: NAD sitting in bed Cardiovascular: RRR, no murmurs Respiratory: largely CTAB, mild crackles at bases Abdomen: soft, non tender non distended Extremities: marked 2-3+ pitting LE edema  Pelvic: atrophic external genitalia, smooth vaginal walls and cervix, no CMT, mobile uterus enlarged approx 15 wk size, no adnexal masses palpated, no blood visualized   Laboratory:  Recent Labs Lab 09/19/15 1446 09/20/15 1616 09/21/15 0237  WBC 4.2 5.7 4.1  HGB 9.6* 8.9* 7.6*  HCT 31.1* 30.4*  26.0*  PLT 179 146* 117*    Recent Labs Lab 09/19/15 1446 09/20/15 1616 09/21/15 0237  NA 138 138 136  K 4.0 3.9 3.7  CL 99 101 103  CO2 30 29 28   BUN 14 15 12   CREATININE 0.58* 0.65 0.54  CALCIUM 7.8* 8.1* 7.5*  PROT 5.7*  --   --   BILITOT 0.6  --   --   ALKPHOS 57  --   --   ALT 8  --   --   AST 17  --   --   GLUCOSE 113* 98 101*      Veatrice Bourbon, MD 09/21/2015, 6:52  AM PGY-2, [place] Intern pager: [phone], text pages welcome

## 2015-09-21 NOTE — Op Note (Signed)
[facility] [place], [id number]   ENDOSCOPY PROCEDURE REPORT  PATIENT: Caroline Lewis, Caroline Lewis  MR#: [id number] BIRTHDATE: 1924-11-21 , 89  yrs. old GENDER: female ENDOSCOPIST: Clarene Essex, MD REFERRED BY: PROCEDURE DATE:  09/30/15 PROCEDURE:  EGD, diagnostic ASA CLASS:     Class III INDICATIONS:  dysphagia and hematemesis. MEDICATIONS: Propofol 40 mg IV and Lidocaine 20 mg IV TOPICAL ANESTHETIC: none  DESCRIPTION OF PROCEDURE: After the risks benefits and alternatives of the procedure were thoroughly explained, informed consent was obtained.  The PENTAX GASTOROSCOPE [id number] endoscope was introduced through the mouth and advanced to the second portion of the duodenum , Without limitations.  The instrument was slowly withdrawn as the mucosa was fully examined. Estimated blood loss is zero unless otherwise noted in this procedure report.    findings are recorded below       Retroflexed views revealed a hiatal hernia.     The scope was then withdrawn from the patient and the procedure completed.  COMPLICATIONS: There were no immediate complications.  ENDOSCOPIC IMPRESSION: 1. Moderate sized hiatal hernia 2. Severe ulcerative linear esophagitis 3. Atrophic gastritis 4. questionable minimal proximal candida esophagitis 5.Otherwise within normal limits EGD without signs of active bleeding  RECOMMENDATIONS: twice a day pump inhibitors slowly advance diet okay to use blood thinners  consider nystatin swish and swallow 4 times a day if dysphagia is continuing       happy to see back as outpatient in follow-up  REPEAT EXAM: as needed  eSigned:  Clarene Essex, MD 09-30-2015 11:45 AM    CC:  CPT CODES: ICD CODES:  The ICD and CPT codes recommended by this software are interpretations from the data that the clinical staff has captured with the software.  The verification of the translation of this report to the ICD and CPT codes and  modifiers is the sole responsibility of the health care institution and practicing physician where this report was generated.  [employer] will not be held responsible for the validity of the ICD and CPT codes included on this report.  AMA assumes no liability for data contained or not contained herein. CPT is a Designer, television/film set of the [employer].  PATIENT NAME:  Caroline Lewis, Caroline Lewis MR#: [id number]

## 2015-09-21 NOTE — Transfer of Care (Signed)
Immediate Anesthesia Transfer of Care Note  Patient: Caroline Lewis  Procedure(s) Performed: Procedure(s): ESOPHAGOGASTRODUODENOSCOPY (EGD) (N/A)  Patient Location: Endoscopy Unit  Anesthesia Type:MAC  Level of Consciousness: awake, alert  and oriented  Airway & Oxygen Therapy: Patient Spontanous Breathing and Patient connected to nasal cannula oxygen  Post-op Assessment: Report given to RN, Post -op Vital signs reviewed and stable and Patient moving all extremities X 4  Post vital signs: Reviewed and stable  Last Vitals:  Filed Vitals:   09/21/15 1055  BP: 113/53  Pulse: 77  Temp: 36.8 C  Resp: 18    Complications: No apparent anesthesia complications

## 2015-09-21 NOTE — Consult Note (Signed)
Reason for Consult: Upper GI bleed Referring Physician: Hospital team  Caroline FLEWELLEN is an 81 y.o. female.  HPI: Patient seen and examined and discussed with herself and her 2 daughters and discussed with the hospital team as well and her hospital computer chart was reviewed and she did throw up a little bit of old blood the other day but has been having a 3 to four-week history of dysphagia and has had an endoscopy 30-40 years ago and may be a barium swallow in the past and her recent colonoscopy from 4 years ago was reviewed and she has lost a little bit of weight because of her dysphagia lately but she minimize his aspirin and nonsteroidals and only recently just started on a pump inhibitor and has no other specific complaints and we discussed her DVT and need for blood thinners  Past Medical History  Diagnosis Date  . Rheumatoid arthritis(714.0)   . IBS (irritable bowel syndrome)   . Constipation   . Allergy     seasonal  . Osteoporosis   . Sciatica   . Peripheral vascular disease ([place])     "beyond help"  . Pneumonia 05/25/12; 2010    Past Surgical History  Procedure Laterality Date  . Replacement total knee  ~ 2010    left  . Facial cosmetic surgery      left cheek  . Rotator cuff repair      right  . Bunionectomy  1990's    bilateral  . Joint replacement    . Partial hip arthroplasty  05/26/12    right  . Cataract extraction w/ intraocular lens  implant, bilateral  ~ 2003  . Eye surgery  ~ 2003    right; "precancerous; took out section in lower part of eye"  . Hip arthroplasty  05/26/2012    Procedure: ARTHROPLASTY BIPOLAR HIP;  Surgeon: Hessie Dibble, MD;  Location: [facility];  Service: Orthopedics;  Laterality: Right;    Family History  Problem Relation Age of Onset  . Lung cancer Sister   . Uterine cancer Sister   . Colon cancer Neg Hx   . Heart disease Father   . GI problems Son   . Irritable bowel syndrome Son     Social History:  reports that she quit  smoking about 27 years ago. Her smoking use included Cigarettes. She has a 5 pack-year smoking history. She has never used smokeless tobacco. She reports that she does not drink alcohol or use illicit drugs.  Allergies:  Allergies  Allergen Reactions  . Fosamax [Alendronate Sodium]     It affected my IBS    Medications: I have reviewed the patient's current medications.  Results for orders placed or performed during the hospital encounter of 09/20/15 (from the past 48 hour(s))  Basic metabolic panel     Status: Abnormal   Collection Time: 09/20/15  4:16 PM  Result Value Ref Range   Sodium 138 135 - 145 mmol/L   Potassium 3.9 3.5 - 5.1 mmol/L   Chloride 101 101 - 111 mmol/L   CO2 29 22 - 32 mmol/L   Glucose, Bld 98 65 - 99 mg/dL   BUN 15 6 - 20 mg/dL   Creatinine, Ser 0.65 0.44 - 1.00 mg/dL   Calcium 8.1 (L) 8.9 - 10.3 mg/dL   GFR calc non Af Amer >60 >60 mL/min   GFR calc Af Amer >60 >60 mL/min    Comment: (NOTE) The eGFR has been calculated using the  CKD EPI equation. This calculation has not been validated in all clinical situations. eGFR's persistently <60 mL/min signify possible Chronic Kidney Disease.    Anion gap 8 5 - 15  CBC     Status: Abnormal   Collection Time: 09/20/15  4:16 PM  Result Value Ref Range   WBC 5.7 4.0 - 10.5 K/uL   RBC 3.55 (L) 3.87 - 5.11 MIL/uL   Hemoglobin 8.9 (L) 12.0 - 15.0 g/dL   HCT 30.4 (L) 36.0 - 46.0 %   MCV 85.6 78.0 - 100.0 fL   MCH 25.1 (L) 26.0 - 34.0 pg   MCHC 29.3 (L) 30.0 - 36.0 g/dL   RDW 27.6 (H) 11.5 - 15.5 %   Platelets 146 (L) 150 - 400 K/uL  I-stat troponin, ED     Status: None   Collection Time: 09/20/15  4:21 PM  Result Value Ref Range   Troponin i, poc 0.01 0.00 - 0.08 ng/mL   Comment 3            Comment: Due to the release kinetics of cTnI, a negative result within the first hours of the onset of symptoms does not rule out myocardial infarction with certainty. If myocardial infarction is still  suspected, repeat the test at appropriate intervals.   Brain natriuretic peptide     Status: Abnormal   Collection Time: 09/20/15  4:32 PM  Result Value Ref Range   B Natriuretic Peptide 147.1 (H) 0.0 - 100.0 pg/mL  Basic metabolic panel     Status: Abnormal   Collection Time: 09/21/15  2:37 AM  Result Value Ref Range   Sodium 136 135 - 145 mmol/L   Potassium 3.7 3.5 - 5.1 mmol/L   Chloride 103 101 - 111 mmol/L   CO2 28 22 - 32 mmol/L   Glucose, Bld 101 (H) 65 - 99 mg/dL   BUN 12 6 - 20 mg/dL   Creatinine, Ser 0.54 0.44 - 1.00 mg/dL   Calcium 7.5 (L) 8.9 - 10.3 mg/dL   GFR calc non Af Amer >60 >60 mL/min   GFR calc Af Amer >60 >60 mL/min    Comment: (NOTE) The eGFR has been calculated using the CKD EPI equation. This calculation has not been validated in all clinical situations. eGFR's persistently <60 mL/min signify possible Chronic Kidney Disease.    Anion gap 5 5 - 15  CBC     Status: Abnormal   Collection Time: 09/21/15  2:37 AM  Result Value Ref Range   WBC 4.1 4.0 - 10.5 K/uL   RBC 3.05 (L) 3.87 - 5.11 MIL/uL   Hemoglobin 7.6 (L) 12.0 - 15.0 g/dL   HCT 26.0 (L) 36.0 - 46.0 %   MCV 85.2 78.0 - 100.0 fL   MCH 24.9 (L) 26.0 - 34.0 pg   MCHC 29.2 (L) 30.0 - 36.0 g/dL   RDW 27.5 (H) 11.5 - 15.5 %   Platelets 117 (L) 150 - 400 K/uL    Comment: PLATELET COUNT CONFIRMED BY SMEAR  T4, free     Status: None   Collection Time: 09/21/15  7:26 AM  Result Value Ref Range   Free T4 0.79 0.61 - 1.12 ng/dL    Dg Chest 2 View  09/20/2015  CLINICAL DATA:  Two 3 week history of mid sternal chest pain associated with shortness of breath and difficulty swallowing; history of previous episodes of pneumonia, former smoker EXAM: CHEST  2 VIEW COMPARISON:  PA and lateral chest x-ray of October 14, 2013 FINDINGS: The lungs are adequately inflated. There are chronically increased interstitial markings bilaterally especially at the left lung base. There is no alveolar infiltrate or pleural  effusion. The cardiac silhouette is mildly enlarged. The pulmonary vascularity is not engorged. There is no pneumothorax. The mediastinum is normal in width. There is moderate thoracolumbar dextroscoliosis. There are multiple compression fractures of mid and lower thoracic vertebral bodies. As best as can be determined these are not entirely new. There is moderate to severe degenerative change of both shoulders. IMPRESSION: COPD and chronic pulmonary fibrotic changes. Acute bronchitis may be present. There is no alveolar pneumonia nor evidence of pulmonary edema. There is stable mild cardiomegaly. Electronically Signed   By: David  Martinique M.D.   On: 09/20/2015 16:58   Ct Angio Chest Pe W/cm &/or Wo Cm  09/20/2015  CLINICAL DATA:  Shortness of breath and hypoxia for several weeks. Unilateral left leg swelling. Clinical suspicion for pulmonary embolism. EXAM: CT ANGIOGRAPHY CHEST WITH CONTRAST TECHNIQUE: Multidetector CT imaging of the chest was performed using the standard protocol during bolus administration of intravenous contrast. Multiplanar CT image reconstructions and MIPs were obtained to evaluate the vascular anatomy. CONTRAST:  37mL OMNIPAQUE IOHEXOL 350 MG/ML SOLN COMPARISON:  None. FINDINGS: Mediastinum/Lymph Nodes: No pulmonary emboli or thoracic aortic dissection identified. Enlarged central pulmonary arteries is suggestive of pulmonary arterial hypertension. Mild to moderate cardiomegaly noted, without evidence of pericardial effusion. No masses or pathologically enlarged lymph nodes identified. Lungs/Pleura: Moderate to severe emphysema noted. Bilateral pleural- parenchymal scarring demonstrated. No evidence of pulmonary consolidation or mass. No evidence of pleural effusion. Upper abdomen: No acute findings. Musculoskeletal: No chest wall mass or suspicious bone lesions identified. Several chronic appearing thoracic and lumbar vertebral compression fracture deformities noted. Review of the MIP  images confirms the above findings. IMPRESSION: No evidence of pulmonary embolism or other acute findings. Moderate severe emphysema and bilateral pleural-parenchymal scarring. Enlarged central pulmonary arteries, suspicious for pulmonary arterial hypertension. Electronically Signed   By: Earle Gell M.D.   On: 09/20/2015 19:02    ROS positive Arthur's history of IBS mostly constipation Blood pressure 113/53, pulse 77, temperature 98.3 F (36.8 C), temperature source Oral, resp. rate 18, height 5' (1.524 m), weight 45.36 kg (100 lb), SpO2 98 %. Physical Exam vital signs stable afebrile no acute distress elderly exam please see preassessment evaluation labs reviewed  Assessment/Plan: Upper GI bleeding in patient with need for blood thinners but also with history of dysphagia lately Plan: The risks benefits methods of endoscopy and possible dilation was discussed with the patient and her family and will proceed this morning with further workup and plan pending those findings  [place] E 09/21/2015, 11:07 AM

## 2015-09-21 NOTE — Anesthesia Preprocedure Evaluation (Addendum)
Anesthesia Evaluation  Patient identified by MRN, date of birth, ID band Patient awake    Reviewed: Allergy & Precautions, H&P , NPO status , Patient's Chart, lab work & pertinent test results  Airway Mallampati: I  TM Distance: >3 FB Neck ROM: full    Dental  (+) Teeth Intact, Dental Advidsory Given   Pulmonary former smoker,    breath sounds clear to auscultation       Cardiovascular + Peripheral Vascular Disease  negative cardio ROS   Rhythm:Regular Rate:Normal     Neuro/Psych  Neuromuscular disease negative psych ROS   GI/Hepatic Neg liver ROS, GERD  Medicated,  Endo/Other  negative endocrine ROS  Renal/GU negative Renal ROS  negative genitourinary   Musculoskeletal  (+) Arthritis ,   Abdominal   Peds negative pediatric ROS (+)  Hematology  (+) anemia ,   Anesthesia Other Findings   Reproductive/Obstetrics negative OB ROS                           Lab Results  Component Value Date   WBC 4.1 09/21/2015   HGB 7.6* 09/21/2015   HCT 26.0* 09/21/2015   MCV 85.2 09/21/2015   PLT 117* 09/21/2015   Lab Results  Component Value Date   CREATININE 0.54 09/21/2015   BUN 12 09/21/2015   NA 136 09/21/2015   K 3.7 09/21/2015   CL 103 09/21/2015   CO2 28 09/21/2015   Lab Results  Component Value Date   INR 1.09 09/19/2015   INR 1.18 05/26/2012   INR 1.18 05/25/2012   EKG: normal sinus rhythm.   Anesthesia Physical Anesthesia Plan  ASA: II  Anesthesia Plan: MAC   Post-op Pain Management:    Induction: Intravenous  Airway Management Planned: Natural Airway and Nasal Cannula  Additional Equipment:   Intra-op Plan:   Post-operative Plan:   Informed Consent: I have reviewed the patients History and Physical, chart, labs and discussed the procedure including the risks, benefits and alternatives for the proposed anesthesia with the patient or authorized representative who  has indicated his/her understanding and acceptance.   Dental advisory given and Dental Advisory Given  Plan Discussed with: CRNA, Anesthesiologist and Surgeon  Anesthesia Plan Comments:        Anesthesia Quick Evaluation

## 2015-09-21 NOTE — Anesthesia Postprocedure Evaluation (Signed)
  Anesthesia Post-op Note  Patient: Caroline Lewis  Procedure(s) Performed: Procedure(s): ESOPHAGOGASTRODUODENOSCOPY (EGD) (N/A)  Patient Location: Endoscopy Unit  Anesthesia Type:MAC  Level of Consciousness: awake, alert  and oriented  Airway and Oxygen Therapy: Patient Spontanous Breathing  Post-op Pain: none  Post-op Assessment: Post-op Vital signs reviewed              Post-op Vital Signs: Reviewed  Last Vitals:  Filed Vitals:   09/21/15 1150  BP:   Pulse: 79  Temp:   Resp: 21    Complications: No apparent anesthesia complications

## 2015-09-22 ENCOUNTER — Inpatient Hospital Stay (HOSPITAL_COMMUNITY): Payer: Commercial Managed Care - HMO

## 2015-09-22 ENCOUNTER — Encounter (HOSPITAL_COMMUNITY): Payer: Self-pay | Admitting: Gastroenterology

## 2015-09-22 DIAGNOSIS — R29898 Other symptoms and signs involving the musculoskeletal system: Secondary | ICD-10-CM | POA: Insufficient documentation

## 2015-09-22 DIAGNOSIS — I272 Pulmonary hypertension, unspecified: Secondary | ICD-10-CM

## 2015-09-22 DIAGNOSIS — E43 Unspecified severe protein-calorie malnutrition: Secondary | ICD-10-CM

## 2015-09-22 DIAGNOSIS — I509 Heart failure, unspecified: Secondary | ICD-10-CM

## 2015-09-22 LAB — FERRITIN: Ferritin: 27 ng/mL (ref 11–307)

## 2015-09-22 LAB — T3, FREE: T3 FREE: 1.6 pg/mL — AB (ref 2.0–4.4)

## 2015-09-22 LAB — CBC
HEMATOCRIT: 26.9 % — AB (ref 36.0–46.0)
HEMOGLOBIN: 8 g/dL — AB (ref 12.0–15.0)
MCH: 25.6 pg — AB (ref 26.0–34.0)
MCHC: 29.7 g/dL — AB (ref 30.0–36.0)
MCV: 85.9 fL (ref 78.0–100.0)
Platelets: 116 10*3/uL — ABNORMAL LOW (ref 150–400)
RBC: 3.13 MIL/uL — ABNORMAL LOW (ref 3.87–5.11)
RDW: 28.2 % — ABNORMAL HIGH (ref 11.5–15.5)
WBC: 4.6 10*3/uL (ref 4.0–10.5)

## 2015-09-22 LAB — RETICULOCYTES
RBC.: 3.36 MIL/uL — ABNORMAL LOW (ref 3.87–5.11)
RETIC CT PCT: 1.9 % (ref 0.4–3.1)
Retic Count, Absolute: 63.8 10*3/uL (ref 19.0–186.0)

## 2015-09-22 LAB — IRON AND TIBC
Iron: 30 ug/dL (ref 28–170)
SATURATION RATIOS: 11 % (ref 10.4–31.8)
TIBC: 262 ug/dL (ref 250–450)
UIBC: 232 ug/dL

## 2015-09-22 LAB — HEPARIN LEVEL (UNFRACTIONATED)
Heparin Unfractionated: 0.23 IU/mL — ABNORMAL LOW (ref 0.30–0.70)
Heparin Unfractionated: 0.26 IU/mL — ABNORMAL LOW (ref 0.30–0.70)

## 2015-09-22 LAB — FOLATE: Folate: 5.9 ng/mL — ABNORMAL LOW (ref >5.9)

## 2015-09-22 LAB — VITAMIN B12: Vitamin B-12: 922 pg/mL — ABNORMAL HIGH (ref 180–914)

## 2015-09-22 MED ORDER — FERROUS SULFATE 325 (65 FE) MG PO TABS
325.0000 mg | ORAL_TABLET | Freq: Three times a day (TID) | ORAL | Status: DC
  Administered 2015-09-22 – 2015-09-23 (×5): 325 mg via ORAL
  Filled 2015-09-22 (×5): qty 1

## 2015-09-22 MED ORDER — COUMADIN BOOK
Freq: Once | Status: AC
  Administered 2015-09-22: 15:00:00
  Filled 2015-09-22: qty 1

## 2015-09-22 MED ORDER — DOCUSATE SODIUM 283 MG RE ENEM
1.0000 | ENEMA | Freq: Every day | RECTAL | Status: DC
  Administered 2015-09-22 – 2015-09-25 (×2): 283 mg via RECTAL
  Filled 2015-09-22 (×4): qty 1

## 2015-09-22 MED ORDER — WARFARIN - PHARMACIST DOSING INPATIENT
Freq: Every day | Status: DC
  Administered 2015-09-22: 17:00:00

## 2015-09-22 MED ORDER — NYSTATIN 100000 UNIT/ML MT SUSP
5.0000 mL | Freq: Four times a day (QID) | OROMUCOSAL | Status: DC
  Administered 2015-09-22 – 2015-09-24 (×11): 500000 [IU] via ORAL
  Filled 2015-09-22 (×14): qty 5

## 2015-09-22 MED ORDER — FOLIC ACID 1 MG PO TABS
1.0000 mg | ORAL_TABLET | Freq: Every day | ORAL | Status: DC
  Administered 2015-09-22 – 2015-09-25 (×4): 1 mg via ORAL
  Filled 2015-09-22 (×4): qty 1

## 2015-09-22 MED ORDER — WARFARIN SODIUM 5 MG PO TABS
5.0000 mg | ORAL_TABLET | Freq: Once | ORAL | Status: AC
  Administered 2015-09-22: 5 mg via ORAL
  Filled 2015-09-22: qty 1

## 2015-09-22 MED ORDER — WARFARIN VIDEO
Freq: Once | Status: AC
  Administered 2015-09-22: 14:00:00

## 2015-09-22 MED ORDER — DOCUSATE NICU RECTAL SYRINGE 10 MG/ML: 2.0000 mL | Freq: Every day | RECTAL | Status: DC

## 2015-09-22 MED ORDER — POLYETHYLENE GLYCOL 3350 17 G PO PACK
17.0000 g | PACK | Freq: Two times a day (BID) | ORAL | Status: DC
  Administered 2015-09-22: 17 g via ORAL
  Filled 2015-09-22: qty 1

## 2015-09-22 NOTE — Evaluation (Signed)
Physical Therapy Evaluation Patient Details Name: Caroline Lewis MRN: [id number] DOB: Feb 04, 1924 Today's Date: 09/22/2015   History of Present Illness  Caroline Lewis is a 81 y.o. female presenting with LE swelling, black emesis . PMH is significant for IBS, GERD, severe OA of back, RA. Found to have DVT from the left common femoral vein to the peritoneal vein  Clinical Impression  Pt presents with impairments as indicated below.  Pt would benefit from further acute skilled PT services to maximize safety and independence with functional mobility.  PT is recommending SNF and supervision for OOB activity following discharge at this time.    Follow Up Recommendations SNF;Supervision for mobility/OOB    Equipment Recommendations  None recommended by PT    Recommendations for Other Services       Precautions / Restrictions Precautions Precautions: Fall Restrictions Weight Bearing Restrictions: No      Mobility  Bed Mobility Overal bed mobility: Needs Assistance Bed Mobility: Supine to Sit;Sit to Supine     Supine to sit: Supervision;HOB elevated (use of rail) Sit to supine: Supervision (no use of rail and HOB down)      Transfers Overall transfer level: Needs assistance   Transfers: Stand Pivot Transfers   Stand pivot transfers: Min guard          Ambulation/Gait             General Gait Details: Pt refusing gait today due to stomach ache. She also notes that she typically stays in bed most of the time and transfers to/from her motorized scooter when out of bed.  Stairs            Wheelchair Mobility    Modified Rankin (Stroke Patients Only)       Balance Overall balance assessment: Needs assistance Sitting-balance support: No upper extremity supported;Feet unsupported Sitting balance-Leahy Scale: Fair     Standing balance support: Bilateral upper extremity supported;During functional activity Standing balance-Leahy Scale: Poor Standing  balance comment: requires her hand(s) on something when going from sit<>stand and standpivots                             Pertinent Vitals/Pain Pain Assessment: Faces Faces Pain Scale: Hurts little more Pain Location: stomach Pain Descriptors / Indicators: Aching Pain Intervention(s): Monitored during session;Repositioned (RN notified, pt requesting meds for stomach pains)    Home Living Family/patient expects to be discharged to:: Skilled nursing facility Living Arrangements: Children Available Help at Discharge: Family;Available 24 hours/day Type of Home: House Home Access: Level entry     Home Layout: Multi-level Home Equipment: Walker - 2 wheels;Shower seat;Grab bars - tub/shower;Hand held shower head;Wheelchair - power      Prior Function Level of Independence: Independent with assistive device(s)         Comments: Pt reports that she mainly gets around with a motorized scooter in the house and stand pivots--limited ambulation only to one of her bathrooms from the door holding onto things as she ambulates from bathroom door to toilet     Hand Dominance   Dominant Hand: Right    Extremity/Trunk Assessment   Upper Extremity Assessment: Generalized weakness           Lower Extremity Assessment: Generalized weakness         Communication   Communication: No difficulties  Cognition Arousal/Alertness: Awake/alert Behavior During Therapy: WFL for tasks assessed/performed Overall Cognitive Status: No family/caregiver present to determine baseline cognitive  functioning (appeared WNL)                      General Comments      Exercises        Assessment/Plan    PT Assessment Patient needs continued PT services  PT Diagnosis Difficulty walking;Generalized weakness   PT Problem List Decreased strength;Decreased range of motion;Decreased activity tolerance;Decreased balance;Decreased mobility;Decreased knowledge of use of DME;Decreased  safety awareness  PT Treatment Interventions DME instruction;Gait training;Functional mobility training;Therapeutic activities;Therapeutic exercise;Patient/family education   PT Goals (Current goals can be found in the Care Plan section) Acute Rehab PT Goals Patient Stated Goal: to go back home PT Goal Formulation: With patient Time For Goal Achievement: 10/06/15 Potential to Achieve Goals: Good    Frequency Min 2X/week   Barriers to discharge        Co-evaluation PT/OT/SLP Co-Evaluation/Treatment: Yes Reason for Co-Treatment: For patient/therapist safety (were advised by nursing staff that pt was a +2) PT goals addressed during session: Mobility/safety with mobility OT goals addressed during session: ADL's and self-care;Strengthening/ROM       End of Session Equipment Utilized During Treatment: Gait belt Activity Tolerance: Patient tolerated treatment well Patient left: in chair;with call bell/phone within reach;with bed alarm set Nurse Communication: Mobility status         Time: 2671-2458 PT Time Calculation (min) (ACUTE ONLY): 13 min   Charges:   PT Evaluation $Initial PT Evaluation Tier I: 1 Procedure     PT G CodesEstill Bamberg [place] Mallon 28-Sep-2015, 3:21 PM Lorita Officer, SPT

## 2015-09-22 NOTE — Progress Notes (Signed)
Physical therapy evaluation performed, Patient is not an appropriate candidate for comprehensive inpatient rehabilitation program. Patient states she is non ambulatory at baseline, mainly uses power chair around the home. Call was received from staff stating family does not desire SNF placement, at this time, CIR is not appropriate. Recommend HHPT with family to assist if family declines SNF.  Alben Deeds, [place] DPT  [phone]

## 2015-09-22 NOTE — Progress Notes (Signed)
ANTICOAGULATION CONSULT NOTE - Follow Up Consult  Pharmacy Consult for heparin Indication: DVT  Allergies  Allergen Reactions  . Fosamax [Alendronate Sodium]     It affected my IBS    Patient Measurements: Height: 5' (152.4 cm) Weight: 104 lb 3.2 oz (47.265 kg) (scale a) IBW/kg (Calculated) : 45.5   Vital Signs: Temp: 98 F (36.7 C) (10/28 1949) Temp Source: Oral (10/28 1949) BP: 99/52 mmHg (10/28 1949) Pulse Rate: 90 (10/28 1949)  Labs:  Recent Labs  09/20/15 1616 09/21/15 0237 09/21/15 1208 09/21/15 2306 09/22/15 0403 09/22/15 0845 09/22/15 1933  HGB 8.9* 7.6* 8.4*  --  8.0*  --   --   HCT 30.4* 26.0* 27.7*  --  26.9*  --   --   PLT 146* 117* 115*  --  116*  --   --   HEPARINUNFRC  --   --   --  0.18*  --  0.23* 0.26*  CREATININE 0.65 0.54  --   --   --   --   --     Estimated Creatinine Clearance: 34.2 mL/min (by C-G formula based on Cr of 0.54).  Assessment: 81 yo female with LLE DVT on heparin and started on warfarin today(day 1 of 5 minimum overlap). GI saw for concern of GIB (hg= 7.6 on 10/27 and hematemesis noted on 09/19/15) and endoscopy showed ulcerative esophagitis and atrophic gastritis without signs of active bleeding.  Heparin level tonight is slightly low at 0.26 units/mL on 900 units/mL. No bleeding or issues noted.  Goal of Therapy:  INR 2-3 Heparin level 0.3-0.7 units/ml Monitor platelets by anticoagulation protocol: Yes   Plan:  -increase heparin to 1000 units/hr -Daily HL and CBC -follow s/s bleeding  Dezerae Freiberger D. Graylyn Bunney, PharmD, BCPS Clinical Pharmacist Pager: [phone] 09/22/2015 8:53 PM

## 2015-09-22 NOTE — Progress Notes (Signed)
Seen and examined.  Discussed with residents in rounds.  Will co sign their note when available.  Briefly, Complicated.  Issues: 1 Acute hematemesis.  Esophageal origin. Thanks for great GI help.  No evidence of active bleeding, despite heparin. 2. Acute DVT.  Tolerating heparin well.  No evidence of bleeding.  Will need 3-6 months of anticoag if she can tolerate for new DVT. 3. Anemia.  Hgb stable.  Watch closely since new anticoag. 4. Post menopausal bleeding.  I would stop workup with 0.6 cm endometrial stripe.  While we cannot fully rule out cancer, she is a poor candidate for endometrial biopsy. 5. Deconditioning due to acute illness and chronic poor mobility from severe OA of back.  Dispo will be important.  I favor DC to SNF and await PT recs. 6. Protein calorie malnutrition.  Severe.  Poor PO intake is an ongoing problem.  Yet one more reason to DC to SNF so that we can observe PO intake longer.  She will need at least another day in the hospital to switch to oral anticoag, watch hemoglobin, PT eval and develop a firm dispo plan.

## 2015-09-22 NOTE — Progress Notes (Signed)
  Echocardiogram 2D Echocardiogram has been performed.  Darlina Sicilian M 09/22/2015, 9:34 AM

## 2015-09-22 NOTE — Progress Notes (Signed)
Caroline Lewis 11:06 AM  Subjective: Patient without any new complaints and no signs of bleeding and wants her diet advanced and her case was discussed with 2 of her daughters as well  Objective: Vital signs stable afebrile no acute distress abdomen is soft nontender hemoglobin stable Assessment: Hiatal hernia and severe reflux esophagitis  Plan: Continue pump inhibitors long-term happy to see back when necessary please call us if we could be of any further assistance with this hospital stay otherwise will advance diet to soft  [facility] E  Pager [phone] After 5PM or if no answer call [phone]

## 2015-09-22 NOTE — Evaluation (Signed)
Occupational Therapy Evaluation Patient Details Name: Caroline Lewis MRN: [id number] DOB: 05-21-1924 Today's Date: 09/22/2015    History of Present Illness Caroline Lewis is a 81 y.o. female presenting with LE swelling, black emesis . PMH is significant for IBS, GERD, severe OA of back, RA. Found to have DVT from the left common femoral vein to the peritoneal vein   Clinical Impression   This 81 yo female admitted with above presents to acute OT with decreased balance, decreased mobility, and increased pain all affecting her ability to care for herself at a Mod independent level as she was previously per her report. She will benefit from acute OT with follow up at SNF.    Follow Up Recommendations  SNF    Equipment Recommendations  None recommended by OT       Precautions / Restrictions Precautions Precautions: Fall Restrictions Weight Bearing Restrictions: No      Mobility Bed Mobility Overal bed mobility: Needs Assistance Bed Mobility: Supine to Sit;Sit to Supine     Supine to sit: Supervision;HOB elevated (use of rail) Sit to supine: Supervision (no use of rail and HOB down)      Transfers Overall transfer level: Needs assistance   Transfers: Stand Pivot Transfers   Stand pivot transfers: Min guard            Balance Overall balance assessment: Needs assistance Sitting-balance support: No upper extremity supported;Feet supported Sitting balance-Leahy Scale: Fair     Standing balance support: Bilateral upper extremity supported;During functional activity Standing balance-Leahy Scale: Poor Standing balance comment: requires her hand(s) on something when going from sit<>stand and standpivots                            ADL Overall ADL's : Needs assistance/impaired Eating/Feeding: Independent;Sitting   Grooming: Set up;Sitting   Upper Body Bathing: Set up;Sitting   Lower Body Bathing: Min guard;Sit to/from stand   Upper Body  Dressing : Set up;Sitting   Lower Body Dressing: Min guard;Sit to/from stand   Toilet Transfer: Min guard;Squat-pivot (bed>recliner)   Toileting- Water quality scientist and Hygiene: Min guard;Sit to/from stand                         Pertinent Vitals/Pain Pain Assessment: Faces Faces Pain Scale: Hurts little more Pain Location: stomach Pain Descriptors / Indicators: Aching Pain Intervention(s): Monitored during session;Repositioned (RN notified, pt requesting meds for stomach pains)     Hand Dominance Right   Extremity/Trunk Assessment Upper Extremity Assessment Upper Extremity Assessment: Generalized weakness   Lower Extremity Assessment Lower Extremity Assessment: Defer to PT evaluation       Communication Communication Communication: No difficulties   Cognition Arousal/Alertness: Awake/alert Behavior During Therapy: WFL for tasks assessed/performed Overall Cognitive Status: No family/caregiver present to determine baseline cognitive functioning (appeared WNL)                                Home Living Family/patient expects to be discharged to:: Skilled nursing facility Living Arrangements: Children Available Help at Discharge: Family;Available 24 hours/day Type of Home: House Home Access: Level entry     Home Layout: Multi-level Alternate Level Stairs-Number of Steps: unknown, but says she has a ramp in the house   Bathroom Shower/Tub: Walk-in shower;Curtain   Bathroom Toilet: Handicapped height     Home Equipment: Environmental consultant - 2 wheels;Shower seat;Grab  bars - tub/shower;Hand held shower head;Wheelchair - power          Prior Functioning/Environment Level of Independence: Independent with assistive device(s)        Comments: Pt reports that she mainly gets around with a motorized scooter in the house and stand pivots--limited ambulation only to one of her bathrooms from the door holding onto things as she ambulates from bathroom door  to toilet    OT Diagnosis: Generalized weakness (? cognitive deficits)   OT Problem List: Decreased strength;Decreased activity tolerance;Impaired balance (sitting and/or standing);Pain   OT Treatment/Interventions: Self-care/ADL training;Patient/family education;Balance training;DME and/or AE instruction    OT Goals(Current goals can be found in the care plan section) Acute Rehab OT Goals Patient Stated Goal: to go back home OT Goal Formulation: With patient Time For Goal Achievement: 09/29/15 Potential to Achieve Goals: Good  OT Frequency: Min 2X/week           Co-evaluation PT/OT/SLP Co-Evaluation/Treatment: Yes Reason for Co-Treatment: For patient/therapist safety (were advised by nursing staff that pt was a +2)   OT goals addressed during session: ADL's and self-care;Strengthening/ROM      End of Session Equipment Utilized During Treatment:  (none) Nurse Communication:  (O2 at 96% on RA so we left if off (RN agreed), pt requesting something for stomach pain)  Activity Tolerance:  (did not want to stay OOB due to stomach pain) Patient left: in bed;with call bell/phone within reach;with bed alarm set   Time: 8657-8469 OT Time Calculation (min): 13 min Charges:  OT General Charges $OT Visit: 1 Procedure OT Evaluation $Initial OT Evaluation Tier I: 1 Procedure   Almon Register [phone] 09/22/2015, 2:20 PM

## 2015-09-22 NOTE — Progress Notes (Signed)
CSW confirmed with pt that plan is to return home with family at time of DC- pt lives with her son who is also home 24hr/day and available to assist pt if needed.  Pt is interested in home health services for PT/OT  CSW signing off  Domenica Reamer, [place] Social Worker [phone]

## 2015-09-22 NOTE — Progress Notes (Signed)
Family Medicine Teaching Service Daily Progress Note Intern Pager: [phone]  Patient name: Caroline Lewis Medical record number: [id number] Date of birth: 1924/11/12 Age: 81 y.o. Gender: female  Primary Care Provider: Zigmund Gottron, MD Consultants: GI Code Status: DNR  Pt Overview and Major Events to Date:  10/26: admit for concern for hematemesis and new DVT  Assessment and Plan:  Caroline Lewis is a 81 y.o. female presenting with LE swelling, black emesis . PMH is significant for IBS, GERD, OA,   DVT- DVT in setting of concern for GI bleed with decreasing hgb to 8.9 to 7.6 - Hold heparin therapy, until GI eval for bleed - will consider transition to Lovenox vs DOAC once stable  Concern for GI bleed- no repeat hematemesis or hematochezia here. However hgb drop from 8.9 to 7.6 but stable overnight at 8. EGD on 10/27 significant for Moderate sized hiatal hernia, Severe ulcerative linear esophagitis, Atrophic gastritis, questionable minimal proximal candida esophagitis and Otherwise within normal limits EGD without signs of active bleeding.  -App GI recs:  -PPI bid: PO Protonix 40 mg bid  -advance diet okay to use blood thinners   -consider nystatin swish and swallow 4 times a day if dysphagia is continuing   -happy to see back as outpatient in follow-up - Hgb 8.9>7.6>>8 - monitor for bleeds  Dysphasia- likely from candidal esophagitis - Nystatin suspension as above  Anemia of chroinc disease: Hgb 8.9>7.6>>8. MCV 86%. Anemia panel significant for borderline low folate at 5.9. -Will give folic acid -Follow Hgb  Elevated TSH- TSH 5.27 on 10/25.  - Low Free T3 at 1.6 (low) - Normal Free T4 at 0.8 (normal)  Concern for CHF- BNP elevated 147 although no baseline in [facility] system, with chronic LE edema, concerning for cardiac etiology. Pt denies SOB although O2 sats in mid 90s on RA. CXR neg for Pulm edema with mild crackles on exam. CT PE negative for PE but significant  for moderate severe emphysema and bilateral pleural-parenchymal scarring and enlarged pulm arteries which could explain her mildly elevated PAH - Echo this AM  Post menopausal bleeding- Pelvic exam neg for evidence of active bleeding. TVUS with normal endometrial stripe suggestive for atrophic uterine line in the setting of bleeding. - monitor bleeding symptoms. - Hgb stable now. Continue to monitor   Chronic pain from OA - will continue home Norco  Constipation- significant history of constipation which has been documented as very severe. On colace enema daily at home. -Will continue home colace enema  Rheumatoid Arthritis - hold home methotrexate in setting of anemia, to begin once Hgb >8. Currently takes 20 mg weekly - will contact her rheumatologist  FEN/GI:  -ADAT,  -KVO -Protonix as above  Prophylaxis:  -Heparin for DVT per pharm -Warfarin per pharmacy for DVT  Disposition: floor for now. Would prefer SNF placement given her recent deconditioning. Will follow up with PATIENT/OT.  Subjective:  Denies shortness of breath or chest pain. Says she didn't have BM's in 4 days.   Objective: Temp:  [98.3 F (36.8 C)-99.2 F (37.3 C)] 99.2 F (37.3 C) (10/28 0451) Pulse Rate:  [77-87] 80 (10/28 0451) Resp:  [18-22] 18 (10/28 0451) BP: (100-113)/(48-60) 108/60 mmHg (10/28 0451) SpO2:  [92 %-99 %] 97 % (10/28 0451) Weight:  [100 lb (45.36 kg)-104 lb 3.2 oz (47.265 kg)] 104 lb 3.2 oz (47.265 kg) (10/28 0451) Physical Exam: Gen: frail appearing, sleeping in bed with no acute distress  Oropharynx: clear, moist, no erythema or  lesion CV: RRR. S1 & S2 audible, no murmurs.  Resp: no apparent WOB, CTAB. Abd: +BS. Soft, NDNT, no rebound or guarding.  Ext: Left leg greater than right, 2+ pitting edema to mid shin on left and to ankle on right Neuro: Alert and oriented, No gross focal deficits  Laboratory:  Recent Labs Lab 09/21/15 0237 09/21/15 1208 09/22/15 0403  WBC 4.1  4.4 4.6  HGB 7.6* 8.4* 8.0*  HCT 26.0* 27.7* 26.9*  PLT 117* 115* 116*    Recent Labs Lab 09/19/15 1446 09/20/15 1616 09/21/15 0237  NA 138 138 136  K 4.0 3.9 3.7  CL 99 101 103  CO2 30 29 28   BUN 14 15 12   CREATININE 0.58* 0.65 0.54  CALCIUM 7.8* 8.1* 7.5*  PROT 5.7*  --   --   BILITOT 0.6  --   --   ALKPHOS 57  --   --   ALT 8  --   --   AST 17  --   --   GLUCOSE 113* 98 101*   US Transvaginal Non-ob  09/21/2015  CLINICAL DATA:  Postmenopausal bleeding.  IBS.  Constipation. EXAM: TRANSABDOMINAL AND TRANSVAGINAL ULTRASOUND OF PELVIS TECHNIQUE: Both transabdominal and transvaginal ultrasound examinations of the pelvis were performed. Transabdominal technique was performed for global imaging of the pelvis including uterus, ovaries, adnexal regions, and pelvic cul-de-sac. It was necessary to proceed with endovaginal exam following the transabdominal exam to visualize the uterus and endometrium. COMPARISON:  CT of the abdomen and pelvis 09/20/2015 FINDINGS: Uterus Measurements: 4.1 x 1.7 x 2.7 cm. No fibroids or other mass visualized. Endometrium Thickness: 0.6 mm.  No focal abnormality visualized. Right ovary Measurements: The ovary is not visualized, either absent or obscured . No adnexal mass identified. Left ovary Measurements: The ovary is not visualized, either absent or obscured . No adnexal mass identified. Other findings Moderate free pelvic fluid identified. Numerous distended bowel loops are identified. IMPRESSION: 1. Normal appearance of the uterus. 2. Normal endometrial stripe. In the setting of post-menopausal bleeding, this is consistent with a benign etiology such as endometrial atrophy. If bleeding remains unresponsive to hormonal or medical therapy, sonohysterogram should be considered for focal lesion work-up. (Ref: Radiological Reasoning: Algorithmic Workup of Abnormal Vaginal Bleeding with Endovaginal Sonography and Sonohysterography. AJR 2008; 751:W25-85) 3.  Nonvisualized ovaries. 4. Ascites and numerous dilated bowel loops. Electronically Signed   By: Nolon Nations M.D.   On: 09/21/2015 17:31   US Pelvis Complete  09/21/2015  CLINICAL DATA:  Postmenopausal bleeding.  IBS.  Constipation. EXAM: TRANSABDOMINAL AND TRANSVAGINAL ULTRASOUND OF PELVIS TECHNIQUE: Both transabdominal and transvaginal ultrasound examinations of the pelvis were performed. Transabdominal technique was performed for global imaging of the pelvis including uterus, ovaries, adnexal regions, and pelvic cul-de-sac. It was necessary to proceed with endovaginal exam following the transabdominal exam to visualize the uterus and endometrium. COMPARISON:  CT of the abdomen and pelvis 09/20/2015 FINDINGS: Uterus Measurements: 4.1 x 1.7 x 2.7 cm. No fibroids or other mass visualized. Endometrium Thickness: 0.6 mm.  No focal abnormality visualized. Right ovary Measurements: The ovary is not visualized, either absent or obscured . No adnexal mass identified. Left ovary Measurements: The ovary is not visualized, either absent or obscured . No adnexal mass identified. Other findings Moderate free pelvic fluid identified. Numerous distended bowel loops are identified. IMPRESSION: 1. Normal appearance of the uterus. 2. Normal endometrial stripe. In the setting of post-menopausal bleeding, this is consistent with a benign etiology such as endometrial atrophy.  If bleeding remains unresponsive to hormonal or medical therapy, sonohysterogram should be considered for focal lesion work-up. (Ref: Radiological Reasoning: Algorithmic Workup of Abnormal Vaginal Bleeding with Endovaginal Sonography and Sonohysterography. AJR 2008; 436:G67-70) 3. Nonvisualized ovaries. 4. Ascites and numerous dilated bowel loops. Electronically Signed   By: Nolon Nations M.D.   On: 09/21/2015 17:31     Mercy Riding, MD 09/22/2015, 7:04 AM PGY-1, [place] Intern pager: [phone], text pages welcome

## 2015-09-22 NOTE — Progress Notes (Signed)
Set pt up to watch coumadin instructional video as ordered.

## 2015-09-22 NOTE — Progress Notes (Signed)
ANTICOAGULATION CONSULT NOTE - Follow Up Consult  Pharmacy Consult for coumadin Indication: DVT  Allergies  Allergen Reactions  . Fosamax [Alendronate Sodium]     It affected my IBS    Patient Measurements: Height: 5' (152.4 cm) Weight: 104 lb 3.2 oz (47.265 kg) (scale a) IBW/kg (Calculated) : 45.5   Vital Signs: Temp: 98.8 F (37.1 C) (10/28 0944) Temp Source: Oral (10/28 0944) BP: 106/54 mmHg (10/28 0944) Pulse Rate: 79 (10/28 0944)  Labs:  Recent Labs  09/19/15 1445  09/19/15 1446 09/20/15 1616 09/21/15 0237 09/21/15 1208 09/21/15 2306 09/22/15 0403 09/22/15 0845  HGB  --   < > 9.6* 8.9* 7.6* 8.4*  --  8.0*  --   HCT  --   < > 31.1* 30.4* 26.0* 27.7*  --  26.9*  --   PLT  --   < > 179 146* 117* 115*  --  116*  --   LABPROT 14.2  --   --   --   --   --   --   --   --   INR 1.09  --   --   --   --   --   --   --   --   HEPARINUNFRC  --   --   --   --   --   --  0.18*  --  0.23*  CREATININE  --   --  0.58* 0.65 0.54  --   --   --   --   < > = values in this interval not displayed.  Estimated Creatinine Clearance: 34.2 mL/min (by C-G formula based on Cr of 0.54).   Medications:  Scheduled:  . docusate sodium  100 mg Oral BID  . ferrous sulfate  325 mg Oral TID WC  . nystatin  5 mL Oral QID  . pantoprazole  40 mg Oral BID  . polyethylene glycol  17 g Oral BID  . simethicone  80 mg Oral TID AC & HS  . sodium chloride  3 mL Intravenous Q12H    Assessment: 81 yo female with LLE DVT on heparin and to begin coumadin (day 1 of 5 minimum overlap). GI saw for concern of GIB (hg= 7.6 on 10/27 and hematemesis noted on 09/19/15) and endoscopy showed ulcerative esophagitis and atrophic gastritis without signs of active bleeding -INR was 1.09 on 09/19/15.  Goal of Therapy:  INR 2-3 Heparin level 0.3-0.7 units/ml Monitor platelets by anticoagulation protocol: Yes   Plan:  -Coumadin 5 mg po today -Daily PT/INR -Will provide patient education  Hildred Laser,  Pharm D 09/22/2015 1:38 PM

## 2015-09-22 NOTE — Progress Notes (Signed)
[place] for heparin Indication: DVT  Allergies  Allergen Reactions  . Fosamax [Alendronate Sodium]     It affected my IBS    Patient Measurements: Height: 5' (152.4 cm) Weight: 100 lb (45.36 kg) IBW/kg (Calculated) : 45.5  Vital Signs: Temp: 98.7 F (37.1 C) (10/27 2358) Temp Source: Oral (10/27 2358) BP: 100/50 mmHg (10/27 2358) Pulse Rate: 85 (10/27 2358)  Labs:  Recent Labs  09/19/15 1445  09/19/15 1446 09/20/15 1616 09/21/15 0237 09/21/15 1208 09/21/15 2306  HGB  --   < > 9.6* 8.9* 7.6* 8.4*  --   HCT  --   < > 31.1* 30.4* 26.0* 27.7*  --   PLT  --   < > 179 146* 117* 115*  --   LABPROT 14.2  --   --   --   --   --   --   INR 1.09  --   --   --   --   --   --   HEPARINUNFRC  --   --   --   --   --   --  0.18*  CREATININE  --   --  0.58* 0.65 0.54  --   --   < > = values in this interval not displayed.  Estimated Creatinine Clearance: 34.2 mL/min (by C-G formula based on Cr of 0.54).   Medical History: Past Medical History  Diagnosis Date  . Rheumatoid arthritis(714.0)   . IBS (irritable bowel syndrome)   . Constipation   . Allergy     seasonal  . Osteoporosis   . Sciatica   . Peripheral vascular disease ([place])     "beyond help"  . Pneumonia 05/25/12; 2010    Assessment: 81yo female with LLE DVT and heparin has been on hold for concern of GIB (Hg was 7.6 this am). She is now s/p endoscopy and noted with severe ulcerative esophagitis but no active bleeding. Pharmacy has been asked to restart heparin. Initial heparin level is subtherapeutic.  Goal of Therapy:  Heparin level 0.3-0.7 units/ml Monitor platelets by anticoagulation protocol: Yes   Plan:  -Increase heparin to 800 units/hr -Check 8 hour level -Daily HL, CBC -Monitor s/sx bleeding    Hughes Better, PharmD, BCPS Clinical Pharmacist Pager: [phone] 09/22/2015 12:33 AM

## 2015-09-23 LAB — HEPARIN LEVEL (UNFRACTIONATED): HEPARIN UNFRACTIONATED: 0.31 [IU]/mL (ref 0.30–0.70)

## 2015-09-23 LAB — CBC
HCT: 29.2 % — ABNORMAL LOW (ref 36.0–46.0)
Hemoglobin: 8.7 g/dL — ABNORMAL LOW (ref 12.0–15.0)
MCH: 25.6 pg — ABNORMAL LOW (ref 26.0–34.0)
MCHC: 29.8 g/dL — ABNORMAL LOW (ref 30.0–36.0)
MCV: 85.9 fL (ref 78.0–100.0)
Platelets: 144 10*3/uL — ABNORMAL LOW (ref 150–400)
RBC: 3.4 MIL/uL — ABNORMAL LOW (ref 3.87–5.11)
RDW: 28.2 % — AB (ref 11.5–15.5)
WBC: 5.6 10*3/uL (ref 4.0–10.5)

## 2015-09-23 LAB — PROTIME-INR
INR: 1.57 — AB (ref 0.00–1.49)
Prothrombin Time: 18.8 seconds — ABNORMAL HIGH (ref 11.6–15.2)

## 2015-09-23 MED ORDER — WARFARIN SODIUM 4 MG PO TABS
2.0000 mg | ORAL_TABLET | Freq: Once | ORAL | Status: AC
  Administered 2015-09-23: 2 mg via ORAL
  Filled 2015-09-23: qty 0.5

## 2015-09-23 MED ORDER — ENOXAPARIN SODIUM 60 MG/0.6ML ~~LOC~~ SOLN
45.0000 mg | Freq: Every day | SUBCUTANEOUS | Status: DC
  Administered 2015-09-23 – 2015-09-25 (×3): 45 mg via SUBCUTANEOUS
  Filled 2015-09-23 (×3): qty 0.6

## 2015-09-23 MED ORDER — FERROUS SULFATE 325 (65 FE) MG PO TABS
325.0000 mg | ORAL_TABLET | Freq: Every day | ORAL | Status: DC
  Administered 2015-09-24 – 2015-09-25 (×2): 325 mg via ORAL
  Filled 2015-09-23 (×2): qty 1

## 2015-09-23 NOTE — Care Management Note (Signed)
Case Management Note  Patient Details  Name: Caroline Lewis MRN: [id number] Date of Birth: 1924/11/03  Subjective/Objective:  81  Y.o. F who lives in private residence with her adult son, who is available 24/7.  Surrounded by Daughters, Education officer, community, [employer].  PT recommends SNF, which she has gone to in past, but is not interested in at this time. Would be interested in [facility] at time of Discharge.                Action/Plan:Will follow for discharge needs.    Expected Discharge Date:  09/26/15               Expected Discharge Plan:  [place]  In-House Referral:     Discharge planning Services  CM Consult  Post Acute Care Choice:    Choice offered to:     DME Arranged:    DME Agency:     HH Arranged:    HH Agency:     Status of Service:  In process, will continue to follow  Medicare Important Message Given:    Date Medicare IM Given:    Medicare IM give by:    Date Additional Medicare IM Given:    Additional Medicare Important Message give by:     If discussed at [place] of Stay Meetings, dates discussed:    Additional Comments:  Delrae Sawyers, RN 09/23/2015, 4:18 PM

## 2015-09-23 NOTE — Progress Notes (Signed)
[place] for heparin Indication: DVT  Allergies  Allergen Reactions  . Fosamax [Alendronate Sodium]     It affected my IBS    Patient Measurements: Height: 5' (152.4 cm) Weight: 102 lb 3.2 oz (46.358 kg) IBW/kg (Calculated) : 45.5   Vital Signs: Temp: 98.5 F (36.9 C) (10/29 0322) Temp Source: Oral (10/29 0322) BP: 127/75 mmHg (10/29 0322) Pulse Rate: 84 (10/29 0322)  Labs:  Recent Labs  09/20/15 1616 09/21/15 0237 09/21/15 1208  09/22/15 0403 09/22/15 0845 09/22/15 1933 09/23/15 0359  HGB 8.9* 7.6* 8.4*  --  8.0*  --   --  8.7*  HCT 30.4* 26.0* 27.7*  --  26.9*  --   --  29.2*  PLT 146* 117* 115*  --  116*  --   --  144*  LABPROT  --   --   --   --   --   --   --  18.8*  INR  --   --   --   --   --   --   --  1.57*  HEPARINUNFRC  --   --   --   < >  --  0.23* 0.26* 0.31  CREATININE 0.65 0.54  --   --   --   --   --   --   < > = values in this interval not displayed.  Estimated Creatinine Clearance: 34.2 mL/min (by C-G formula based on Cr of 0.54).  Assessment: 81 yo female with LLE DVT on heparin and started on warfarin today(day 1 of 5 minimum overlap). GI saw for concern of GIB (hg= 7.6 on 10/27 and hematemesis noted on 09/19/15) and endoscopy showed ulcerative esophagitis and atrophic gastritis without signs of active bleeding.  AM level is therapeutic, warfarin was started last night  Goal of Therapy:  INR 2-3 Heparin level 0.3-0.7 units/ml Monitor platelets by anticoagulation protocol: Yes   Plan:  -Continue heparin at 1000 units/hr -Daily HL and CBC -follow s/s bleeding    Hughes Better, PharmD, BCPS Clinical Pharmacist Pager: [phone] 09/23/2015 5:59 AM

## 2015-09-23 NOTE — Progress Notes (Signed)
Provided education to patient and daughter Caroline Lewis visiting and demonstrated Lovenox subcutaneous injection. Verbalized understanding.Marland Kitchen

## 2015-09-23 NOTE — Progress Notes (Signed)
Family Medicine Teaching Service Daily Progress Note Caroline Lewis Pager: [phone]  Patient name: Caroline Lewis Lewis Medical record number: [id number] Date of birth: 07-May-1924 Age: 81 y.o. Gender: female  Primary Care Provider: Zigmund Gottron, MD Consultants: GI Code Status: DNR  Pt Overview and Major Events to Date:  10/26: admit for concern for hematemesis and new DVT  Assessment and Plan: Caroline Lewis Lewis is a 81 y.o. female presenting with LE swelling, black emesis . PMH is significant for IBS, GERD, OA,   DVT- DVT in setting of concern for GI bleed with decreasing hgb to 8.9 to 7.6 - Change from heparin to Lovenox   - Started Warfarin (INR 1.57 this AM) - Nursing to training patient on shots   Concern for GI bleed- Vitals stable, no tachy, blood pressure 127/75.  hgb stable. no repeat hematemesis or hematochezia here. However hgb drop from 8.9 to 7.6 but stable overnight at 8. EGD on 10/27 significant for Moderate sized hiatal hernia, Severe ulcerative linear esophagitis, Atrophic gastritis, questionable minimal proximal candida esophagitis and Otherwise within normal limits EGD without signs of active bleeding.  -App GI recs:  -PPI bid: PO Protonix 40 mg bid  -happy to see back as outpatient in follow-up - Hgb 8.9>7.6>>8>8.7>8.7- stable  Dysphasia- likely from candidal esophagitis - Nystatin suspension QID (2/14 days )  Anemia of chroinc disease: Hgb 8.9>7.6>>8. MCV 86%. Anemia panel significant for borderline low folate at 5.9.                         - Continue folic acid 1 mg  -Follow Hgb  Elevated TSH- TSH 5.27 on 10/25.  - Low Free T3 at 1.6 (low) - Normal Free T4 at 0.8 (normal)  Concern for CHF- BNP elevated 147 although no baseline in [facility] system, with chronic LE edema, concerning for cardiac etiology. Pt denies SOB although O2 sats in mid 90s on RA. CXR neg for Pulm edema with mild crackles on exam. CT PE negative for PE but significant for moderate severe  emphysema and bilateral pleural-parenchymal scarring and enlarged pulm arteries which could explain her mildly elevated PAH.ECHO showing Normal EF, grade 2 diastolic dysfunction  Post menopausal bleeding- Pelvic exam neg for evidence of active bleeding. TVUS with normal endometrial stripe suggestive for atrophic uterine line in the setting of bleeding. - monitor bleeding symptoms. - Hgb stable now. Continue to monitor   Chronic pain from OA - will continue home Norco  Constipation- significant history of constipation which has been documented as very severe. On colace enema daily at home. -Will continue home colace enema  Rheumatoid Arthritis - hold home methotrexate in setting of anemia, to begin once Hgb >8. Currently takes 20 mg weekly  FEN/GI: Carb Modified/Heart Healthy/ Protonix as above  Prophylaxis: Lovenox per pharmacy/Warfarin per pharmacy for DVT  Disposition: Patient to go home tomorrow. Lives with son, has 24 hour care   Subjective:  Patient states she is feeling well. She does not have much of appetite. She states that she did have some abdominal pain in the past, but not currently.   Objective: Temp:  [98 F (36.7 C)-98.8 F (37.1 C)] 98.5 F (36.9 C) (10/29 0322) Pulse Rate:  [79-90] 84 (10/29 0322) Resp:  [18] 18 (10/29 0322) BP: (99-127)/(52-75) 127/75 mmHg (10/29 0322) SpO2:  [91 %-94 %] 91 % (10/29 0322) Weight:  [102 lb 3.2 oz (46.358 kg)] 102 lb 3.2 oz (46.358 kg) (10/29 0322) Physical Exam: Gen: frail  appearing, sleeping in bed with no acute distress  Oropharynx: clear, moist, no erythema or lesion CV: RRR. S1 & S2 audible, no murmurs.  Resp: no apparent WOB, CTAB. Abd: +BS. Soft, NDNT, no rebound or guarding.  Ext: Left leg greater than right, 2+ pitting edema to mid shin on left and to ankle on right Neuro: Alert and oriented, No gross focal deficits  Laboratory:  Recent Labs Lab 09/21/15 1208 09/22/15 0403 09/23/15 0359  WBC 4.4 4.6 5.6   HGB 8.4* 8.0* 8.7*  HCT 27.7* 26.9* 29.2*  PLT 115* 116* 144*    Recent Labs Lab 09/19/15 1446 09/20/15 1616 09/21/15 0237  NA 138 138 136  K 4.0 3.9 3.7  CL 99 101 103  CO2 30 29 28   BUN 14 15 12   CREATININE 0.58* 0.65 0.54  CALCIUM 7.8* 8.1* 7.5*  PROT 5.7*  --   --   BILITOT 0.6  --   --   ALKPHOS 57  --   --   ALT 8  --   --   AST 17  --   --   GLUCOSE 113* 98 101*   No results found.   Caroline Lewis Lewis Caroline Media, MD 09/23/2015, 8:32 AM PGY-1, Caroline Lewis Caroline Lewis pager: [phone], text pages welcome

## 2015-09-23 NOTE — Progress Notes (Signed)
ANTICOAGULATION CONSULT NOTE - Follow Up Consult  Pharmacy Consult for coumadin, heparin>>lovenox Indication: DVT  Allergies  Allergen Reactions  . Fosamax [Alendronate Sodium]     It affected my IBS    Patient Measurements: Height: 5' (152.4 cm) Weight: 102 lb 3.2 oz (46.358 kg) IBW/kg (Calculated) : 45.5   Vital Signs: Temp: 98.1 F (36.7 C) (10/29 1155) Temp Source: Oral (10/29 1155) BP: 113/59 mmHg (10/29 1155) Pulse Rate: 81 (10/29 1155)  Labs:  Recent Labs  09/20/15 1616 09/21/15 0237 09/21/15 1208  09/22/15 0403 09/22/15 0845 09/22/15 1933 09/23/15 0359  HGB 8.9* 7.6* 8.4*  --  8.0*  --   --  8.7*  HCT 30.4* 26.0* 27.7*  --  26.9*  --   --  29.2*  PLT 146* 117* 115*  --  116*  --   --  144*  LABPROT  --   --   --   --   --   --   --  18.8*  INR  --   --   --   --   --   --   --  1.57*  HEPARINUNFRC  --   --   --   < >  --  0.23* 0.26* 0.31  CREATININE 0.65 0.54  --   --   --   --   --   --   < > = values in this interval not displayed.  Estimated Creatinine Clearance: 34.2 mL/min (by C-G formula based on Cr of 0.54).   Medications:  Scheduled:  . docusate sodium  1 enema Rectal Daily  . ferrous sulfate  325 mg Oral TID WC  . folic acid  1 mg Oral Daily  . nystatin  5 mL Oral QID  . pantoprazole  40 mg Oral BID  . simethicone  80 mg Oral TID AC & HS  . sodium chloride  3 mL Intravenous Q12H  . Warfarin - Pharmacist Dosing Inpatient   Does not apply q1800    Assessment: 81 yo female with LLE DVT on heparin and to begin coumadin (day 2 of 5 minimum overlap). GI saw for concern of GIB hg= 7.6 on 10/27 and hematemesis noted on 09/19/15 now with hgb of 8.7. Endoscopy showed ulcerative esophagitis and atrophic gastritis without signs of active bleeding -INR was 1.09 on 09/19/15, patient given 5mg  of warfarin last night and INR is now 1.57 will need to reduce dose for tonight. IV heparin at goal this am now being changed to sq lovenox. Patient has  borderline renal function for qd vs bid dosing. Given recent low hgb and age will be conservative and start lovenox 45mg  daily.  Goal of Therapy:  INR 2-3 Monitor platelets by anticoagulation protocol: Yes   Plan:  -Coumadin 2 mg po today -Daily PT/INR -D/c heparin -Lovenox 45mg  daily  Erin Hearing PharmD., BCPS Clinical Pharmacist Pager [phone] 09/23/2015 12:06 PM

## 2015-09-24 DIAGNOSIS — D529 Folate deficiency anemia, unspecified: Secondary | ICD-10-CM

## 2015-09-24 DIAGNOSIS — R54 Age-related physical debility: Secondary | ICD-10-CM | POA: Insufficient documentation

## 2015-09-24 LAB — CBC
HCT: 29.5 % — ABNORMAL LOW (ref 36.0–46.0)
HEMOGLOBIN: 8.7 g/dL — AB (ref 12.0–15.0)
MCH: 24.9 pg — AB (ref 26.0–34.0)
MCHC: 29.5 g/dL — ABNORMAL LOW (ref 30.0–36.0)
MCV: 84.5 fL (ref 78.0–100.0)
PLATELETS: 173 10*3/uL (ref 150–400)
RBC: 3.49 MIL/uL — AB (ref 3.87–5.11)
RDW: 27.9 % — ABNORMAL HIGH (ref 11.5–15.5)
WBC: 5.1 10*3/uL (ref 4.0–10.5)

## 2015-09-24 LAB — PROTIME-INR
INR: 1.84 — ABNORMAL HIGH (ref 0.00–1.49)
PROTHROMBIN TIME: 21.2 s — AB (ref 11.6–15.2)

## 2015-09-24 MED ORDER — WARFARIN SODIUM 4 MG PO TABS
2.0000 mg | ORAL_TABLET | Freq: Once | ORAL | Status: AC
  Administered 2015-09-24: 2 mg via ORAL
  Filled 2015-09-24: qty 0.5

## 2015-09-24 NOTE — Discharge Summary (Signed)
[facility] Discharge Summary  Patient name: Caroline Lewis Medical record number: [id number] Date of birth: June 25, 1924 Age: 81 y.o. Gender: female Date of Admission: 09/20/2015  Date of Discharge: 09/25/2015 Admitting Physician: Lupita Dawn, MD  Primary Care Provider: Zigmund Gottron, MD Consultants:Gastroentrologist  Indication for Hospitalization: Hematemesis, DVT  Discharge Diagnoses/Problem List:  Left leg DVT Anemia Severe protein calorie malnutrition Rheumatoid arthritis  Disposition: home with home health nurse and PT  Discharge Condition: stable  Discharge Exam: Gen: frail appearing, sleeping in bed with no acute distress  Oropharynx: clear, moist, no erythema or lesion CV: RRR. S1 & S2 audible, no murmurs. 2+ radial and dorsalis pedis pulses. Resp: no apparent WOB, CTAB. Abd: +BS. Soft, NDNT, no rebound or guarding.  Ext: Left leg greater than right, 2+ pitting edema to mid shin on left and to ankle on right Neuro: Alert and oriented, No gross focal deficits  Brief Hospital Course:  SHRITA THIEN is a 81 y.o. female with past medical history of IBS, GERD, RA and postmenopausal bleeding who presented with hematemesis and left lower leg DVT.   Hematemesis- no hematemesis or hematochezia while in house. Vitals were stable throughout her stay. She came in with Hgb of 8.9 that has dropped to 7.6, and came back up to 8.7 without blood transfusion, and stayed stable. She had EGD on 10/27 significant for moderate sized hiatal hernia, severe ulcerative linear esophagitis, atrophic gastritis, questionable minimal proximal candida esophagitis and otherwise within normal limits EGD without signs of active bleeding. Initially started on IV Protonix 40 mg twice a day, then transitioned to oral. She was also started on nystatin suspension four times a day that she received for 5 days of 14.  Anemia of chroinc disease: Hgb stable. Anemia  panel significant for borderline low folate at 5.9, iron, ferretin and TIBC.She is on methotrexate for RA. Started on folic acid and iron pills.   DVT- Extremity doppler with DVT in left leg from the common femoral vein through proximal tibial and peroneal vein. Also superficial thrombosis in the saphenofemoral junction.CTA chest without evidence of pulmonary embolism or other acute findings. Started on warfarin; bridged with heparin then lovonex. Her INR was 2.1 on day of discharge. INR goal is between 2-3  Subclinical hypothyroidism- TSH 5.27, Low Free T3 at 1.6 and normal Free T4 at 0.8.Unreliable in hospital setting  HFpEF- BNP elevated 147 although no baseline in [facility] system, with chronic lower extremity edema, concerning for cardiac etiology. Patient denies shortness of breath although O2 sats in mid 90s on RA on presentation. CXR neg for pulm edema with mild crackles on exam. CTA negative for PE but significant for moderate severe emphysema and bilateral pleural-parenchymal scarring and enlarged pulm arteries which could explain her mildly elevated PAH. ECHO showing Normal EF, grade 2 diastolic dysfunction  Post menopausal bleeding- Pelvic exam negative for evidence of active bleeding. TVUS with normal endometrial stripe (0.2mm) suggestive for atrophic uterine line in the setting of bleeding.  Chronic pain from RA: on methotrexate and Norco at home. Methotrexate was held due anemia and low folate levels while in house and resumed at discharge.  Constipation- significant history of constipation which has been documented as very severe. On colace enema daily at home which was continued here.  Issues for Follow Up:  1. Anemia: Hgb 8.7 on discharge. Hematemesis resolved. Discharged on iron and folic acid. Recheck CBC. 2. DVT: check INR 3. Subclinical hypothyroidism: can consider rechecking 4. Severe protein  calorie malnutrition: poor PO intake and deconditioning.  Assess nutritional status.  Significant Procedures: none  Significant Labs and Imaging:   Recent Labs Lab 09/22/15 0403 09/23/15 0359 09/24/15 0545  WBC 4.6 5.6 5.1  HGB 8.0* 8.7* 8.7*  HCT 26.9* 29.2* 29.5*  PLT 116* 144* 173    Recent Labs Lab 09/19/15 1446 09/20/15 1616 09/21/15 0237  NA 138 138 136  K 4.0 3.9 3.7  CL 99 101 103  CO2 30 29 28   GLUCOSE 113* 98 101*  BUN 14 15 12   CREATININE 0.58* 0.65 0.54  CALCIUM 7.8* 8.1* 7.5*  ALKPHOS 57  --   --   AST 17  --   --   ALT 8  --   --   ALBUMIN 2.9*  --   --     Results/Tests Pending at Time of Discharge: none  Discharge Medications:    Medication List    TAKE these medications        docusate sodium 283 MG enema  Commonly known as:  ENEMEEZ  Place 1 enema (283 mg total) rectally daily.     ferrous sulfate 325 (65 FE) MG tablet  Take 1 tablet (325 mg total) by mouth 3 (three) times daily with meals.     folic acid 1 MG tablet  Commonly known as:  FOLVITE  Take 1 mg by mouth daily.     HYDROcodone-acetaminophen 10-325 MG tablet  Commonly known as:  NORCO  Take 1 tablet by mouth every 8 (eight) hours as needed for moderate pain.     magnesium citrate Soln  Take 148 mLs (0.5 Bottles total) by mouth once.     methotrexate 2.5 MG tablet  Commonly known as:  RHEUMATREX  Take 20 mg by mouth once a week. Caution:Chemotherapy. Protect from light. Take on Fridays     nystatin 100000 UNIT/ML suspension  Commonly known as:  MYCOSTATIN  Take 5 mLs (500,000 Units total) by mouth 4 (four) times daily.     omeprazole 40 MG capsule  Commonly known as:  PRILOSEC  Take 1 capsule (40 mg total) by mouth daily.     ondansetron 4 MG tablet  Commonly known as:  ZOFRAN  Take 1 tablet (4 mg total) by mouth every 8 (eight) hours as needed for nausea or vomiting.     polyethylene glycol packet  Commonly known as:  MIRALAX / GLYCOLAX  Take 17 g by mouth daily as needed for mild constipation.     Simethicone 80  MG Tabs  Take 1 tablet (80 mg total) by mouth 4 (four) times daily -  before meals and at bedtime.     tolterodine 2 MG 24 hr capsule  Commonly known as:  DETROL LA  Take 1 capsule (2 mg total) by mouth daily.     TOVIAZ 8 MG Tb24 tablet  Generic drug:  fesoterodine  Take 8 mg by mouth daily.     warfarin 5 MG tablet  Commonly known as:  COUMADIN  Take 1 tablet (5 mg total) by mouth one time only at 6 PM.        Discharge Instructions: Please refer to Patient Instructions section of EMR for full details.  Patient was counseled important signs and symptoms that should prompt return to medical care, changes in medications, dietary instructions, activity restrictions, and follow up appointments.   Follow-Up Appointments: Follow-up Information    Follow up with [facility], DO. Go on 09/28/2015.   Specialty:  Family  Medicine   Why:  @10 :15a for hospital follow-up   Contact information:   [address] [place] [address] [phone]       Follow up with [facility].   Why:  HHPT/HHRN/HHOT   Contact information:   [place]      Mercy Riding, MD 09/25/2015, 7:05 PM PGY-1, [place]

## 2015-09-24 NOTE — Progress Notes (Signed)
ANTICOAGULATION CONSULT NOTE - Follow Up Consult  Pharmacy Consult for coumadin, heparin>>lovenox, warfarin Indication: DVT  Allergies  Allergen Reactions  . Fosamax [Alendronate Sodium]     It affected my IBS    Patient Measurements: Height: 5' (152.4 cm) Weight: 101 lb 13.6 oz (46.2 kg) IBW/kg (Calculated) : 45.5   Vital Signs: Temp: 98.4 F (36.9 C) (10/30 1157) Temp Source: Oral (10/30 1157) BP: 112/60 mmHg (10/30 1157) Pulse Rate: 77 (10/30 1157)  Labs:  Recent Labs  09/22/15 0403 09/22/15 0845 09/22/15 1933 09/23/15 0359 09/24/15 0545  HGB 8.0*  --   --  8.7* 8.7*  HCT 26.9*  --   --  29.2* 29.5*  PLT 116*  --   --  144* 173  LABPROT  --   --   --  18.8* 21.2*  INR  --   --   --  1.57* 1.84*  HEPARINUNFRC  --  0.23* 0.26* 0.31  --     Estimated Creatinine Clearance: 34.2 mL/min (by C-G formula based on Cr of 0.54).   Medications:  Scheduled:  . docusate sodium  1 enema Rectal Daily  . enoxaparin (LOVENOX) injection  45 mg Subcutaneous Daily  . ferrous sulfate  325 mg Oral Q breakfast  . folic acid  1 mg Oral Daily  . nystatin  5 mL Oral QID  . pantoprazole  40 mg Oral BID  . simethicone  80 mg Oral TID AC & HS  . sodium chloride  3 mL Intravenous Q12H  . Warfarin - Pharmacist Dosing Inpatient   Does not apply q1800    Assessment: 81 yo female with LLE DVT on heparin and to begin coumadin (day 2 of 5 minimum overlap). GI saw for concern of GIB hg= 7.6 on 10/27 and hematemesis noted on 09/19/15 now with hgb of 8.7. Endoscopy showed ulcerative esophagitis and atrophic gastritis without signs of active bleeding INR was 1.09 on 09/19/15 INR now up to 1.8 after lower dose given last night. Hgb stable 8.7, no bleeding noted.  Goal of Therapy:  INR 2-3 Monitor platelets by anticoagulation protocol: Yes   Plan:  -Repeat Coumadin 2 mg po today -Daily PT/INR -Lovenox 45mg  daily  Erin Hearing PharmD., BCPS Clinical Pharmacist Pager  [phone] 09/24/2015 12:44 PM

## 2015-09-24 NOTE — Progress Notes (Signed)
Family Medicine Teaching Service Daily Progress Note Intern Pager: [phone]  Patient name: Caroline Lewis Medical record number: [id number] Date of birth: 07/18/1924 Age: 81 y.o. Gender: female  Primary Care Provider: Zigmund Gottron, MD Consultants: GI Code Status: DNR  Pt Overview and Major Events to Date:  10/26: admit for concern for hematemesis and new DVT  Assessment and Plan: Caroline Lewis is a 81 y.o. female presenting with LE swelling, black emesis . PMH is significant for IBS, GERD, OA,   DVT- DVT in setting of concern for GI bleed with decreasing hgb to 8.9 to 7.6.  - Lovenox  45 mg SubQ dialy. One of the daughters had education on Lovonex administration in case patient is discharged before INR is therpeutic. - Warfarin 2 mg daily (INR 1.84 this AM). Pharmacy dosing - PT/INR in the morning  Concern for GI bleed- Vitals stable, no tachy, blood pressure 122/65.  hgb stable. no repeat hematemesis or hematochezia here.. EGD on 10/27 significant for moderate sized hiatal hernia, severe ulcerative linear esophagitis, atrophic gastritis, questionable minimal proximal candida esophagitis and otherwise within normal limits EGD without signs of active bleeding.  -App GI recs:  -PPI bid: PO Protonix 40 mg bid  -happy to see back as outpatient in follow-up - Hgb trend: 8.9>7.6>>8>8.7>8.7- stable - iron and folic acid as below  Dysphasia- likely from candidal esophagitis - Nystatin suspension QID (3/14 days )  Anemia of chroinc disease: stable.  Anemia panel significant for borderline low folate at 5.9, iron, ferretin and TIBC.                       - Continue folic acid 1 mg  - iron pills  Elevated TSH- TSH 5.27 on 10/25. Unreliable in hospital setting - Low Free T3 at 1.6 (low) - Normal Free T4 at 0.8 (normal)  Concern for CHF- BNP elevated 147 although no baseline in [facility] system, with chronic LE edema, concerning for cardiac etiology. Pt denies SOB although O2 sats  in mid 90s on RA. CXR neg for Pulm edema with mild crackles on exam. CT PE negative for PE but significant for moderate severe emphysema and bilateral pleural-parenchymal scarring and enlarged pulm arteries which could explain her mildly elevated PAH.ECHO showing Normal EF, grade 2 diastolic dysfunction  Post menopausal bleeding- Pelvic exam neg for evidence of active bleeding. TVUS with normal endometrial stripe (0.27mm) suggestive for atrophic uterine line in the setting of bleeding. - monitor bleeding symptoms. - Hgb stable now. Continue to monitor   Chronic pain from OA - will continue home Norco  Constipation- significant history of constipation which has been documented as very severe. On colace enema daily at home. -Will continue home colace enema  Rheumatoid Arthritis - hold home methotrexate in setting of anemia, to begin once Hgb >8. Currently takes 20 mg weekly  FEN/GI: Carb Modified/Heart Healthy/ Protonix as above  Prophylaxis: Lovenox per pharmacy/Warfarin per pharmacy for DVT  Disposition: PT recommended SNF. Patient don't want to go to SNF. Attempt to call Daughter [name]) today (10/30) not successful. Will try again this afternoon.  Subjective:  Patient states she is feeling well. No more hematemesis. She has eaten her breakfast but not a lot. She says it didn't taste good to her. She denies chest pain, shortness of breath. She had BM the day before yesterday. Objective: Temp:  [98 F (36.7 C)-98.2 F (36.8 C)] 98 F (36.7 C) (10/30 0429) Pulse Rate:  [73-87] 73 (10/30 0429)  Resp:  [18-20] 18 (10/30 0429) BP: (113-122)/(53-65) 122/65 mmHg (10/30 0429) SpO2:  [90 %-92 %] 92 % (10/30 0429) Weight:  [101 lb 13.6 oz (46.2 kg)] 101 lb 13.6 oz (46.2 kg) (10/30 0429) Physical Exam: Gen: frail appearing, sleeping in bed with no acute distress  Oropharynx: clear, moist, no erythema or lesion CV: RRR. S1 & S2 audible, no murmurs. 2+ radial and dorsalis pedis pulses. Resp: no  apparent WOB, CTAB. Abd: +BS. Soft, NDNT, no rebound or guarding.  Ext: Left leg greater than right, 2+ pitting edema to mid shin on left and to ankle on right Neuro: Alert and oriented, No gross focal deficits  Laboratory:  Recent Labs Lab 09/22/15 0403 09/23/15 0359 09/24/15 0545  WBC 4.6 5.6 5.1  HGB 8.0* 8.7* 8.7*  HCT 26.9* 29.2* 29.5*  PLT 116* 144* 173    Recent Labs Lab 09/19/15 1446 09/20/15 1616 09/21/15 0237  NA 138 138 136  K 4.0 3.9 3.7  CL 99 101 103  CO2 30 29 28   BUN 14 15 12   CREATININE 0.58* 0.65 0.54  CALCIUM 7.8* 8.1* 7.5*  PROT 5.7*  --   --   BILITOT 0.6  --   --   ALKPHOS 57  --   --   ALT 8  --   --   AST 17  --   --   GLUCOSE 113* 98 101*   No results found.   Mercy Riding, MD 09/24/2015, 8:27 AM PGY-1, [place] Intern pager: [phone], text pages welcome

## 2015-09-25 ENCOUNTER — Other Ambulatory Visit: Payer: Commercial Managed Care - HMO

## 2015-09-25 ENCOUNTER — Ambulatory Visit: Payer: Commercial Managed Care - HMO | Admitting: Nurse Practitioner

## 2015-09-25 DIAGNOSIS — I272 Other secondary pulmonary hypertension: Secondary | ICD-10-CM

## 2015-09-25 DIAGNOSIS — R54 Age-related physical debility: Secondary | ICD-10-CM

## 2015-09-25 LAB — PROTIME-INR
INR: 2.1 — ABNORMAL HIGH (ref 0.00–1.49)
Prothrombin Time: 23.4 seconds — ABNORMAL HIGH (ref 11.6–15.2)

## 2015-09-25 MED ORDER — WARFARIN SODIUM 5 MG PO TABS: 5.0000 mg | ORAL_TABLET | Freq: Once | ORAL | Status: AC

## 2015-09-25 MED ORDER — WARFARIN SODIUM 5 MG PO TABS: 5.0000 mg | ORAL_TABLET | Freq: Once | ORAL | Status: DC

## 2015-09-25 MED ORDER — NYSTATIN 100000 UNIT/ML MT SUSP: 5.0000 mL | Freq: Four times a day (QID) | OROMUCOSAL | Status: DC

## 2015-09-25 NOTE — Progress Notes (Addendum)
Spoke with patient, PT and Adult Child [name]) this AM as pt was being reevaluated by PT for disposition. Patient and adult child admitted that  Sister [name] was hoping for  STSNF as a means to assist Mother to return to [facility] "2 yrs ago". 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 She is  open to HHPT/HHRN with [facility]. Pt has adult children, grandchildren and great-grandchildren to assist with her care 24/7, as well as, an adult child who lives in her home with her. Made MD aware who stated he had long discussion with [name] over weekend for STSNF and will look at todays PT eval. Pt also has grand daughter who is a LCSW with [employer]. If STSNF is needed in the future arrangements can be facilitated easily from home through her PCP. Will continue to follow for any new Discharge/Disposition needs.Anticipate discharge today.

## 2015-09-25 NOTE — Progress Notes (Signed)
IV removed per discharge order. Discharge instructions given and explained to daughter with teach back. Taught daughter how to give Lovenox SQ with teach back. Discharged via wheelchair to daughter and son's care with Student Nurse present.

## 2015-09-25 NOTE — Progress Notes (Signed)
Family Medicine Teaching Service Daily Progress Note Intern Pager: [phone]  Patient name: Caroline Lewis Medical record number: [id number] Date of birth: 27-Oct-1924 Age: 81 y.o. Gender: female  Primary Care Provider: Zigmund Gottron, MD Consultants: GI Code Status: DNR  Pt Overview and Major Events to Date:  10/26: admit for concern for hematemesis and new DVT  Assessment and Plan: Caroline Lewis is a 81 y.o. female presenting with LE swelling, black emesis . PMH is significant for IBS, GERD, OA,   DVT- Extremity doppler with DVT in left leg from the common femoral vein through proximal tibial and peroneal vein. Also superficial thrombosis in the saphenofemoral junction.CTA chest without evidence of pulmonary embolism or other acute findings. Started on warfarin with heparin bridge, which was changed to Lovenox. INR therapeutic 2.1 today -Discontinue Lovonex as INR is therapeutic today. - Warfarin per pharmacy - PT/INR in the morning  Hematemesis- no hematemesis or hematochezia while in house. Vitals were stable throughout her stay. She came in with Hgb of 8.9 that has dropped dow to 7.6 and came back up to 8.7 without blood transfusion and stayed stable. She had EGD on 10/27 significant for moderate sized hiatal hernia, severe ulcerative linear esophagitis, atrophic gastritis, questionable minimal proximal candida esophagitis and otherwise within normal limits EGD without signs of active bleeding. Initially started on IV Protonix 40 mg twice a day, then transitioned to oral. She was also started on nystatin suspension four times a day. -App GI recs:  -PPI bid: PO Protonix 40 mg bid  -happy to see back as outpatient in follow-up - Hgb trend: 8.9>7.6>>8>8.7>8.7- stable - iron and folic acid as below  Dysphasia- likely from candidal esophagitis - Nystatin suspension QID (3/14 days )  Anemia of chroinc disease: stable.  Anemia panel significant for borderline low folate at  5.9, iron, ferretin and TIBC.                       - Continue folic acid 1 mg  - iron pills  Subclinical Hypothyroidism:TSH 5.27 on 10/25. Unreliable in hospital setting - Low Free T3 at 1.6 (low) - Normal Free T4 at 0.8 (normal)  HFpEF- BNP elevated 147 although no baseline in [facility] system, with chronic lower extremity edema, concerning for cardiac etiology. Patient denies shortness of breath although O2 sats in mid 90s on RA on presentation. CXR neg for pulm edema with mild crackles on exam. CTA negative for PE but significant for moderate severe emphysema and bilateral pleural-parenchymal scarring and enlarged pulm arteries which could explain her mildly elevated PAH. ECHO showing Normal EF, grade 2 diastolic dysfunction  Post menopausal bleeding- Pelvic exam neg for evidence of active bleeding. TVUS with normal endometrial stripe (0.76mm) suggestive for atrophic uterine line in the setting of bleeding. - monitor bleeding symptoms. - Hgb stable now. Continue to monitor   Rheumatoid Arthritis - hold home methotrexate in setting of anemia, to begin once Hgb >8. Currently takes 20 mg weekly  Constipation- significant history of constipation which has been documented as very severe. On colace enema daily at home. -Will continue home colace enema  Rheumatoid Arthritis - hold home methotrexate in setting of anemia, to begin once Hgb >8. Currently takes 20 mg weekly  FEN/GI: Carb Modified/Heart Healthy/ Protonix as above  Prophylaxis: Lovenox per pharmacy/Warfarin per pharmacy for DVT  Disposition: PT recommended SNF. Patient didn't want  go to SNF intially. However, after some discussion, she has agreed to go to SNF. SW  consulted and she will be discharged to SNF as soon as one is available. INR therapeutic today.  Subjective:  Says her belly was hurting a little bit overnight. She says it is her IBS. Denies shortness of breath and chest pain. She is eating her breakfast. She also like to  know if she can go to SNF that is close to her home. I told her SW come and talk to her about those options. Objective: Temp:  [97.9 F (36.6 C)-98.4 F (36.9 C)] 97.9 F (36.6 C) (10/31 0433) Pulse Rate:  [77-90] 80 (10/31 0433) Resp:  [18-20] 19 (10/31 0433) BP: (112-134)/(60-75) 134/75 mmHg (10/31 0433) SpO2:  [92 %-98 %] 92 % (10/31 0433) Weight:  [102 lb 4.8 oz (46.403 kg)] 102 lb 4.8 oz (46.403 kg) (10/31 0433) Physical Exam: Gen: frail appearing, sleeping in bed with no acute distress  Oropharynx: clear, moist, no erythema or lesion CV: RRR. S1 & S2 audible, no murmurs. 2+ radial and dorsalis pedis pulses. Resp: no apparent WOB, CTAB. Abd: +BS. Soft, NDNT, no rebound or guarding.  Ext: Left leg greater than right, 2+ pitting edema to mid shin on left and to ankle on right Neuro: Alert and oriented, No gross focal deficits  Laboratory:  Recent Labs Lab 09/22/15 0403 09/23/15 0359 09/24/15 0545  WBC 4.6 5.6 5.1  HGB 8.0* 8.7* 8.7*  HCT 26.9* 29.2* 29.5*  PLT 116* 144* 173    Recent Labs Lab 09/19/15 1446 09/20/15 1616 09/21/15 0237  NA 138 138 136  K 4.0 3.9 3.7  CL 99 101 103  CO2 30 29 28   BUN 14 15 12   CREATININE 0.58* 0.65 0.54  CALCIUM 7.8* 8.1* 7.5*  PROT 5.7*  --   --   BILITOT 0.6  --   --   ALKPHOS 57  --   --   ALT 8  --   --   AST 17  --   --   GLUCOSE 113* 98 101*   No results found.   Mercy Riding, MD 09/25/2015, 8:01 AM PGY-1, [place] Intern pager: [phone], text pages welcome

## 2015-09-25 NOTE — Care Management Important Message (Signed)
Important Message  Patient Details  Name: Caroline Lewis MRN: [id number] Date of Birth: 10-Apr-1924   Medicare Important Message Given:  Yes-second notification given    Nathen May 09/25/2015, 5:27 PM

## 2015-09-25 NOTE — Progress Notes (Signed)
Occupational Therapy Treatment Patient Details Name: Caroline Lewis MRN: [id number] DOB: Apr 17, 1924 Today's Date: 09/25/2015    History of present illness Caroline Lewis is a 81 y.o. female presenting with LE swelling, black emesis . PMH is significant for IBS, GERD, severe OA of back, RA. Found to have DVT from the left common femoral vein to the peritoneal vein   OT comments  This 81 yo female admitted with above presents to acute OT making progress with toileting transfers. She will continue to benefit from acute OT with now follow up [place].  Follow Up Recommendations  Home health OT;Supervision/Assistance - 24 hour    Equipment Recommendations  None recommended by OT       Precautions / Restrictions Precautions Precautions: Fall Restrictions Weight Bearing Restrictions: No       Mobility Bed Mobility Overal bed mobility: Modified Independent Bed Mobility: Supine to Sit;Sit to Supine     Supine to sit: Modified independent (Device/Increase time) Sit to supine: Modified independent (Device/Increase time)   General bed mobility comments: HOB flat  Transfers Overall transfer level: Needs assistance Equipment used: None Transfers: Squat Pivot Transfers   Squat pivot transfers: Supervision             ADL Overall ADL's : Needs assistance/impaired                         Toilet Transfer: Supervision/safety;Squat-pivot;BSC   Toileting- Clothing Manipulation and Hygiene: Total assistance (with S sit<>stand)                          Cognition   Behavior During Therapy: WFL for tasks assessed/performed Overall Cognitive Status: Within Functional Limits for tasks assessed                                    Pertinent Vitals/ Pain       Pain Assessment: Faces ("I just need to go to the bathroom" (bowel movement)) Faces Pain Scale: Hurts little more Pain Location: anus--constipated bowel movement Pain Descriptors /  Indicators: Sore         Frequency Min 2X/week     Progress Toward Goals  OT Goals(current goals can now be found in the care plan section)  Progress towards OT goals: Progressing toward goals  Acute Rehab OT Goals Patient Stated Goal: to go back home  Plan Discharge plan needs to be updated       End of Session Equipment Utilized During Treatment:  (none)   Activity Tolerance Patient tolerated treatment well   Patient Left in bed;with call bell/phone within reach;with bed alarm set   Nurse Communication  (nursing student--pt had a good sized bowel movement of "normal" color)        Time: 7124-5809 OT Time Calculation (min): 18 min  Charges: OT General Charges $OT Visit: 1 Procedure OT Treatments $Self Care/Home Management : 8-22 mins  Almon Register [phone] 09/25/2015, 12:19 PM

## 2015-09-25 NOTE — Progress Notes (Signed)
Physical Therapy Treatment Patient Details Name: Caroline Lewis MRN: [id number] DOB: 31-Mar-1924 Today's Date: 09/25/2015    History of Present Illness Caroline Lewis is a 81 y.o. female presenting with LE swelling, black emesis . PMH is significant for IBS, GERD, severe OA of back, RA. Found to have DVT from the left common femoral vein to the peritoneal vein    PT Comments    Pt is moving at a level appropriate for home based on her PLOF and family support available.  Recommend HHPT. Pt and family in agreement.  Follow Up Recommendations  Home health PT;Supervision for mobility/OOB     Equipment Recommendations  None recommended by PT    Recommendations for Other Services       Precautions / Restrictions Restrictions Weight Bearing Restrictions: No    Mobility  Bed Mobility Overal bed mobility: Modified Independent Bed Mobility: Supine to Sit     Supine to sit: Modified independent (Device/Increase time)     General bed mobility comments: Pt performed with bed flat wihtout rail with no cueing or A from PT.  Transfers Overall transfer level: Needs assistance Equipment used: None Transfers: Stand Pivot Transfers   Stand pivot transfers: Supervision;Min guard       General transfer comment: Pt able to transfer without physical A, but cueing for hand placement.  min/guard to S level.    Ambulation/Gait             General Gait Details: deferred   Stairs            Wheelchair Mobility    Modified Rankin (Stroke Patients Only)       Balance     Sitting balance-Leahy Scale: Fair       Standing balance-Leahy Scale: Poor                      Cognition Arousal/Alertness: Awake/alert Behavior During Therapy: WFL for tasks assessed/performed Overall Cognitive Status: Within Functional Limits for tasks assessed                      Exercises      General Comments General comments (skin integrity, edema, etc.):  Discussion with nursing, family ,and pt.  Based on pt's performance and level of A at home, pt is safe for d/c home.      Pertinent Vitals/Pain Pain Assessment: No/denies pain    Home Living                      Prior Function            PT Goals (current goals can now be found in the care plan section) Acute Rehab PT Goals Patient Stated Goal: to go back home PT Goal Formulation: With patient Time For Goal Achievement: 10/06/15 Potential to Achieve Goals: Good Progress towards PT goals: Progressing toward goals    Frequency  Min 2X/week    PT Plan Discharge plan needs to be updated    Co-evaluation             End of Session   Activity Tolerance: Patient tolerated treatment well Patient left: in chair;with call bell/phone within reach;with chair alarm set;with family/visitor present     Time: 5498-2641 PT Time Calculation (min) (ACUTE ONLY): 19 min  Charges:  $Therapeutic Activity: 8-22 mins                    G Codes:  Caroline Lewis 09/25/2015, 10:00 AM

## 2015-09-25 NOTE — Progress Notes (Signed)
ANTICOAGULATION CONSULT NOTE - Follow Up Consult  Pharmacy Consult for coumadin, heparin>>lovenox Indication: DVT  Allergies  Allergen Reactions  . Fosamax [Alendronate Sodium]     It affected my IBS    Patient Measurements: Height: 5' (152.4 cm) Weight: 102 lb 4.8 oz (46.403 kg) IBW/kg (Calculated) : 45.5   Vital Signs: Temp: 98.1 F (36.7 C) (10/31 0844) Temp Source: Oral (10/31 0844) BP: 122/74 mmHg (10/31 0844) Pulse Rate: 90 (10/31 0844)  Labs:  Recent Labs  09/22/15 1933 09/23/15 0359 09/24/15 0545 09/25/15 0400  HGB  --  8.7* 8.7*  --   HCT  --  29.2* 29.5*  --   PLT  --  144* 173  --   LABPROT  --  18.8* 21.2* 23.4*  INR  --  1.57* 1.84* 2.10*  HEPARINUNFRC 0.26* 0.31  --   --     Estimated Creatinine Clearance: 34.2 mL/min (by C-G formula based on Cr of 0.54).   Medications:  Scheduled:  . docusate sodium  1 enema Rectal Daily  . enoxaparin (LOVENOX) injection  45 mg Subcutaneous Daily  . ferrous sulfate  325 mg Oral Q breakfast  . folic acid  1 mg Oral Daily  . nystatin  5 mL Oral QID  . pantoprazole  40 mg Oral BID  . simethicone  80 mg Oral TID AC & HS  . sodium chloride  3 mL Intravenous Q12H  . Warfarin - Pharmacist Dosing Inpatient   Does not apply q1800    Assessment: 81 yo female with LLE DVT on heparin and to begin coumadin (day 3 of 5 minimum overlap).  INR today = 2.10 CBC stable  Goal of Therapy:  INR 2-3 Monitor platelets by anticoagulation protocol: Yes   Plan:  -Repeat Coumadin 2 mg po today -Daily PT/INR -Continue Lovenox 45mg  daily (needs 5 day minimum)  Thank you Anette Guarneri, PharmD [phone]  09/25/2015 8:50 AM

## 2015-09-25 NOTE — Care Management Note (Addendum)
Case Management Note  Patient Details  Name: Caroline Lewis MRN: [id number] Date of Birth: 11/04/1924  Subjective/Objective:                    Action/Plan:Discharge   Expected Discharge Date:  09/26/15               Expected Discharge Plan:  [place]  In-House Referral:     Discharge planning Services  CM Consult  Post Acute Care Choice:    Choice offered to:  Patient, Adult Children  DME Arranged:   (Has BSC, Walker and Teacher, adult education. Denies need for DME. ) DME Agency:     HH Arranged:  RN, PT HH Agency:  [facility]  Status of Service:  Completed, signed off  Medicare Important Message Given:    Date Medicare IM Given:    Medicare IM give by:    Date Additional Medicare IM Given:    Additional Medicare Important Message give by:     If discussed at [place] of Stay Meetings, dates discussed:    Additional Comments:Await MD input.   HHPT/HHRN/HHOT has been arranged with [facility] (with confirmation).   CrutchfieldAntony Haste, RN 09/25/2015, 10:47 AM

## 2015-09-25 NOTE — Discharge Instructions (Signed)
It has been a pleasure taking care of you! You were admitted due to bleeding which is likely from esophagus and stomach. We have started you on medication.  We will be discharging you on more of this medication that you need to continue taking after you leave the hospital.  We have also done an ultrasound of your legs that showed blood clotting. We have started you on medication for this as well. We will be discharging you on more of this medication that you need to take at home. You need a follow up with your primary care doctor to have your blood checked while you are on this medication. The address, date and time are found on the discharge paper under follow up section. We might have made some adjustments to your other medications. Please, make sure to read the directions before you take them. The names and directions on how to take these medications are found on this discharge paper under medication section.  Below, you can find some reading about deep vein thrombosis (blood clotting).  Take care,  Deep Vein Thrombosis A deep vein thrombosis (DVT) is a blood clot (thrombus) that usually occurs in a deep, larger vein of the lower leg or the pelvis, or in an upper extremity such as the arm. These are dangerous and can lead to serious and even life-threatening complications if the clot travels to the lungs. A DVT can damage the valves in your leg veins so that instead of flowing upward, the blood pools in the lower leg. This is called post-thrombotic syndrome, and it can result in pain, swelling, discoloration, and sores on the leg. CAUSES A DVT is caused by the formation of a blood clot in your leg, pelvis, or arm. Usually, several things contribute to the formation of blood clots. A clot may develop when:  Your blood flow slows down.  Your vein becomes damaged in some way.  You have a condition that makes your blood clot more easily. RISK FACTORS A DVT is more likely to develop in:  People  who are older, especially over 66 years of age.  People who are overweight (obese).  People who sit or lie still for a long time, such as during long-distance travel (over 4 hours), bed rest, hospitalization, or during recovery from certain medical conditions like a stroke.  People who do not engage in much physical activity (sedentary lifestyle).  People who have chronic breathing disorders.  People who have a personal or family history of blood clots or blood clotting disease.  People who have peripheral vascular disease (PVD), diabetes, or some types of cancer.  People who have heart disease, especially if the person had a recent heart attack or has congestive heart failure.  People who have neurological diseases that affect the legs (leg paresis).  People who have had a traumatic injury, such as breaking a hip or leg.  People who have recently had major or lengthy surgery, especially on the hip, knee, or abdomen.  People who have had a central line placed inside a large vein.  People who take medicines that contain the hormone estrogen. These include birth control pills and hormone replacement therapy.  Pregnancy or during childbirth or the postpartum period.  Long plane flights (over 8 hours). SIGNS AND SYMPTOMS Symptoms of a DVT can include:   Swelling of your leg or arm, especially if one side is much worse.  Warmth and redness of your leg or arm, especially if one side is much  worse.  Pain in your arm or leg. If the clot is in your leg, symptoms may be more noticeable or worse when you stand or walk.  A feeling of pins and needles, if the clot is in the arm. The symptoms of a DVT that has traveled to the lungs (pulmonary embolism, PE) usually start suddenly and include:  Shortness of breath while active or at rest.  Coughing or coughing up blood or blood-tinged mucus.  Chest pain that is often worse with deep breaths.  Rapid or irregular heartbeat.  Feeling  light-headed or dizzy.  Fainting.  Feeling anxious.  Sweating. There may also be pain and swelling in a leg if that is where the blood clot started. These symptoms may represent a serious problem that is an emergency. Do not wait to see if the symptoms will go away. Get medical help right away. Call your local emergency services (911 in the [place] Do not drive yourself to the hospital. DIAGNOSIS Your health care provider will take a medical history and perform a physical exam. You may also have other tests, including:  Blood tests to assess the clotting properties of your blood.  Imaging tests, such as CT, ultrasound, MRI, X-ray, and other tests to see if you have clots anywhere in your body. TREATMENT After a DVT is identified, it can be treated. The type of treatment that you receive depends on many factors, such as the cause of your DVT, your risk for bleeding or developing more clots, and other medical conditions that you have. Sometimes, a combination of treatments is necessary. Treatment options may be combined and include:  Monitoring the blood clot with ultrasound.  Taking medicines by mouth, such as newer blood thinners (anticoagulants), thrombolytics, or warfarin.  Taking anticoagulant medicine by injection or through an IV tube.  Wearing compression stockings or using different types ofdevices.  Surgery (rare) to remove the blood clot or to place a filter in your abdomen to stop the blood clot from traveling to your lungs. Treatments for a DVT are often divided into immediate treatment and long-term treatment (up to 3 months after DVT). You can work with your health care provider to choose the treatment program that is best for you. HOME CARE INSTRUCTIONS If you are taking a newer oral anticoagulant:  Take the medicine every single day at the same time each day.  Understand what foods and drugs interact with this medicine.  Understand that there are no regular blood  tests required when using this medicine.  Understand the side effects of this medicine, including excessive bruising or bleeding. Ask your health care provider or pharmacist about other possible side effects. If you are taking warfarin:  Understand how to take warfarin and know which foods can affect how warfarin works in Veterinary surgeon.  Understand that it is dangerous to take too much or too little warfarin. Too much warfarin increases the risk of bleeding. Too little warfarin continues to allow the risk for blood clots.  Follow your PT and INR blood testing schedule. The PT and INR results allow your health care provider to adjust your dose of warfarin. It is very important that you have your PT and INR tested as often as told by your health care provider.  Avoid major changes in your diet, or tell your health care provider before you change your diet. Arrange a visit with a registered dietitian to answer your questions. Many foods, especially foods that are high in vitamin K, can interfere  with warfarin and affect the PT and INR results. Eat a consistent amount of foods that are high in vitamin K, such as:  Spinach, kale, broccoli, cabbage, collard greens, turnip greens, Brussels sprouts, peas, cauliflower, seaweed, and parsley.  Beef liver and pork liver.  Green tea.  Soybean oil.  Tell your health care provider about any and all medicines, vitamins, and supplements that you take, including aspirin and other over-the-counter anti-inflammatory medicines. Be especially cautious with aspirin and anti-inflammatory medicines. Do not take those before you ask your health care provider if it is safe to do so. This is important because many medicines can interfere with warfarin and affect the PT and INR results.  Do not start or stop taking any over-the-counter or prescription medicine unless your health care provider or pharmacist tells you to do so. If you take warfarin, you will also need to do  these things:  Hold pressure over cuts for longer than usual.  Tell your dentist and other health care providers that you are taking warfarin before you have any procedures in which bleeding may occur.  Avoid alcohol or drink very small amounts. Tell your health care provider if you change your alcohol intake.  Do not use tobacco products, including cigarettes, chewing tobacco, and e-cigarettes. If you need help quitting, ask your health care provider.  Avoid contact sports. General Instructions  Take over-the-counter and prescription medicines only as told by your health care provider. Anticoagulant medicines can have side effects, including easy bruising and difficulty stopping bleeding. If you are prescribed an anticoagulant, you will also need to do these things:  Hold pressure over cuts for longer than usual.  Tell your dentist and other health care providers that you are taking anticoagulants before you have any procedures in which bleeding may occur.  Avoid contact sports.  Wear a medical alert bracelet or carry a medical alert card that says you have had a PE.  Ask your health care provider how soon you can go back to your normal activities. Stay active to prevent new blood clots from forming.  Make sure to exercise while traveling or when you have been sitting or standing for a long period of time. It is very important to exercise. Exercise your legs by walking or by tightening and relaxing your leg muscles often. Take frequent walks.  Wear compression stockings as told by your health care provider to help prevent more blood clots from forming.  Do not use tobacco products, including cigarettes, chewing tobacco, and e-cigarettes. If you need help quitting, ask your health care provider.  Keep all follow-up appointments with your health care provider. This is important. PREVENTION Take these actions to decrease your risk of developing another DVT:  Exercise regularly. For  at least 30 minutes every day, engage in:  Activity that involves moving your arms and legs.  Activity that encourages good blood flow through your body by increasing your heart rate.  Exercise your arms and legs every hour during long-distance travel (over 4 hours). Drink plenty of water and avoid drinking alcohol while traveling.  Avoid sitting or lying in bed for long periods of time without moving your legs.  Maintain a weight that is appropriate for your height. Ask your health care provider what weight is healthy for you.  If you are a woman who is over 49 years of age, avoid unnecessary use of medicines that contain estrogen. These include birth control pills.  Do not smoke, especially if you take estrogen  medicines. If you need help quitting, ask your health care provider. If you are hospitalized, prevention measures may include:  Early walking after surgery, as soon as your health care provider says that it is safe.  Receiving anticoagulants to prevent blood clots.If you cannot take anticoagulants, other options may be available, such as wearing compression stockings or using different types of devices. SEEK IMMEDIATE MEDICAL CARE IF:  You have new or increased pain, swelling, or redness in an arm or leg.  You have numbness or tingling in an arm or leg.  You have shortness of breath while active or at rest.  You have chest pain.  You have a rapid or irregular heartbeat.  You feel light-headed or dizzy.  You cough up blood.  You notice blood in your vomit, bowel movement, or urine. These symptoms may represent a serious problem that is an emergency. Do not wait to see if the symptoms will go away. Get medical help right away. Call your local emergency services (911 in the [place] Do not drive yourself to the hospital.   This information is not intended to replace advice given to you by your health care provider. Make sure you discuss any questions you have with your  health care provider.   Document Released: 11/11/2005 Document Revised: 08/02/2015 Document Reviewed: 03/08/2015 Elsevier Interactive Patient Education [employer].

## 2015-09-26 ENCOUNTER — Telehealth: Payer: Self-pay | Admitting: Internal Medicine

## 2015-09-26 MED ORDER — ENOXAPARIN SODIUM 40 MG/0.4ML ~~LOC~~ SOLN: 45.0000 mg | SUBCUTANEOUS | Status: DC

## 2015-09-26 NOTE — Patient Outreach (Signed)
[facility]) Care Management  09/26/2015  Caroline Lewis July 28, 1924 [id number]   Referral from Natividad Brood, RN to assign Community RN, assigned Raina Mina, RN.  Thanks, Ronnell Freshwater. [place], [place] Assistant Phone: [phone] Fax: [phone]

## 2015-09-26 NOTE — Telephone Encounter (Signed)
Received a fax from [place] needing clarification in the dosage of the Lovenox.  Ordered to inject 0.45 mLs into skin daily.  The entire syringe is 40 mg+ 0.4 mL.  Please call [phone] to clarify.  Derl Barrow, RN

## 2015-09-26 NOTE — Telephone Encounter (Signed)
Received page from [place] who spoke with floor pharmacist regarding patient needing two more days of Lovenox to complete bridge to Warfarin. Patient was not discharged with Lovenox. Sent prescription in to pharmacy for Lovenox 45mg  for days 4 and 5 of bridge.

## 2015-09-26 NOTE — Consult Note (Signed)
   [facility] CM Inpatient Consult   09/26/2015  [name] [birth year] [id number] Late entry: Patient evaluated for community based chronic disease management services with [facility] as a benefit of patient's [facility]. Spoke with patient and daughter at bedside to explain [place] [facility] services.  Patient will receive post discharge transition of care call and will be evaluated for monthly home visits for assessments and disease process education. Consent form signed 09/22/15.  Left contact information and [facility] literature at bedside. Made Inpatient Case Manager aware that [place] Management following. Of note, [facility] Care Management services does not replace or interfere with any services that are arranged by inpatient case management or social work.  For additional questions or referrals please contact:   Natividad Brood, RN BSN [facility] Liaison  [phone] business mobile phone

## 2015-09-26 NOTE — Patient Outreach (Signed)
[facility]) Care Management  09/26/2015  Caroline Lewis October 08, 1924 [id number] Received a call from [place] verified, stating that she was calling about her home health care to start.  She asked if her care had been arranged with [facility] because she had call the number provided on the discharge sheet of [phone] and she spoke to someone named Guerry Minors who stated that they were in a different state.  Explained that the home health was arranged by the inpatient RNCM. Gave the daughter the number for the local number for [facility] of [phone] for follow up. Encouraged her to call back.  Explained that [place] [facility] was her care [place] and that nurse will call her as well.  She states that she is going to take her mother to her house on Thursday.  She states that address is: [address] [place], [place] [address]. Her telephone contact number there is: [phone]. Will have community care manager to follow up regarding the new information received. Caroline Brood, RN BSN [facility] Liaison  [phone] business mobile phone

## 2015-09-26 NOTE — Progress Notes (Signed)
ANTICOAGULATION CONSULT NOTE - Follow Up Consult  Pharmacy Consult for coumadin, heparin>>lovenox Indication: DVT  Allergies  Allergen Reactions  . Fosamax [Alendronate Sodium]     It affected my IBS    Patient Measurements: Height: 5' (152.4 cm) Weight: 102 lb 4.8 oz (46.403 kg) IBW/kg (Calculated) : 45.5   Vital Signs:    Labs:  Recent Labs  09/24/15 0545 09/25/15 0400  HGB 8.7*  --   HCT 29.5*  --   PLT 173  --   LABPROT 21.2* 23.4*  INR 1.84* 2.10*    Estimated Creatinine Clearance: 34.2 mL/min (by C-G formula based on Cr of 0.54).   Medications:  Scheduled:    Assessment: 81 yo female with LLE DVT on heparin and to begin coumadin  (needs 5 day overlap)  INR at discharge= 2.10 CBC stable  Goal of Therapy:  INR 2-3 Monitor platelets by anticoagulation protocol: Yes   Plan:  Originally discharged without Lovenox - Two more days worth of Lovenox to be prescribed today   Thank you Anette Guarneri, PharmD [phone]  09/26/2015 10:09 AM

## 2015-09-27 ENCOUNTER — Telehealth: Payer: Self-pay | Admitting: *Deleted

## 2015-09-27 ENCOUNTER — Ambulatory Visit: Payer: Commercial Managed Care - HMO | Admitting: Cardiology

## 2015-09-27 NOTE — Telephone Encounter (Signed)
Called patient to clarify.  She should not be on both Detrol and Toviaz.  She wants to switch to detrol.  No prior auth needed.

## 2015-09-27 NOTE — Telephone Encounter (Signed)
Prior Authorization received from [employer] for [place] 8 mg. PA form placed in provider box for completion. Derl Barrow, RN

## 2015-09-28 ENCOUNTER — Ambulatory Visit (INDEPENDENT_AMBULATORY_CARE_PROVIDER_SITE_OTHER): Payer: Commercial Managed Care - HMO | Admitting: Family Medicine

## 2015-09-28 ENCOUNTER — Other Ambulatory Visit: Payer: Self-pay | Admitting: *Deleted

## 2015-09-28 ENCOUNTER — Ambulatory Visit: Payer: Medicare HMO | Admitting: Family Medicine

## 2015-09-28 VITALS — BP 116/78 | HR 94 | Temp 97.8°F | Resp 17 | Wt 103.8 lb

## 2015-09-28 DIAGNOSIS — I5032 Chronic diastolic (congestive) heart failure: Secondary | ICD-10-CM | POA: Diagnosis not present

## 2015-09-28 DIAGNOSIS — I82402 Acute embolism and thrombosis of unspecified deep veins of left lower extremity: Secondary | ICD-10-CM | POA: Diagnosis not present

## 2015-09-28 DIAGNOSIS — Z7901 Long term (current) use of anticoagulants: Secondary | ICD-10-CM

## 2015-09-28 DIAGNOSIS — D649 Anemia, unspecified: Secondary | ICD-10-CM | POA: Diagnosis not present

## 2015-09-28 LAB — POCT INR: INR: 5.1

## 2015-09-28 NOTE — Patient Instructions (Signed)
Thank you so much for coming to visit [place] today! We checked your blood level today. I will let you know the results. Your INR was elevated today. Please skip one day and then cut the dose in half to 2.5mg  daily. Follow up at the beginning of next week so we can recheck your INR. Please monitor your weight at home. If your weight increases by five pounds in one week, please let us know. If your swelling or shortness of breath worsens, please let us know.  Thanks again! Dr. Gerlean Ren

## 2015-09-28 NOTE — Patient Outreach (Signed)
[facility] [facility]) Care Management  09/28/2015  ELISABELLA HACKER May 07, 1924 [id number]   Initial Transition of care (incompleted)  RN spoke with caregiver daughter Maryelizabeth Eberle) and introduced the [facility] program and purpose of today's call. RN inquired on pt's status concerning plan of recovery and HHealth services. Daughter states pt has decided to return to her home in [place] and not at her daughter's home in [place]. RN inquired further on if this was a good time to cover the pt's discharge information however caregiver indicated it was not as she was at the grocery store and requested a call back at a later time. RN has informed caregiver that she may received a call back from this nurse or another nurse closer to the pt for possible community home visits if appropriate. Daughter verbalized an understanding and was receptive. Will notify administration for the relocation of this pt for the appropriate case coordinator to visit in the area.  Raina Mina, RN Care Management Coordinator [facility] Main Office [phone]

## 2015-09-28 NOTE — Patient Outreach (Signed)
[facility]) Care Management  09/28/2015  KEYERRA LAMERE Sep 26, 1924 [id number]   Referral from Raina Mina RN for [facility] to do change in patient location after discharge.  Assigned Dannielle Huh RN.

## 2015-09-29 ENCOUNTER — Inpatient Hospital Stay: Payer: Commercial Managed Care - HMO | Admitting: Family Medicine

## 2015-09-29 ENCOUNTER — Encounter: Payer: Self-pay | Admitting: Family Medicine

## 2015-09-29 ENCOUNTER — Telehealth: Payer: Self-pay | Admitting: *Deleted

## 2015-09-29 LAB — CBC
HEMATOCRIT: 34.2 % — AB (ref 36.0–46.0)
Hemoglobin: 10.6 g/dL — ABNORMAL LOW (ref 12.0–15.0)
MCH: 25 pg — ABNORMAL LOW (ref 26.0–34.0)
MCHC: 31 g/dL (ref 30.0–36.0)
MCV: 80.7 fL (ref 78.0–100.0)
MPV: 8.6 fL (ref 8.6–12.4)
PLATELETS: 365 10*3/uL (ref 150–400)
RBC: 4.24 MIL/uL (ref 3.87–5.11)
RDW: 26.3 % — ABNORMAL HIGH (ref 11.5–15.5)
WBC: 7.3 10*3/uL (ref 4.0–10.5)

## 2015-09-29 NOTE — Telephone Encounter (Signed)
Kasey called in INR of 3.1 on [place] today. She was at 5.1 yesterday at [facility]. Patient's dose was decreased from 5 mg daily to 2.5 mg daily. [facility] nurse says patient ate a large amount of greens yesterday. No changes in medications, no bruising or bleeding. Instructed to keep patient on 2.5 mg daily and recheck on Monday per Dr Gerlean Ren.Kastin Cerda, Kevin Fenton

## 2015-10-01 DIAGNOSIS — I5030 Unspecified diastolic (congestive) heart failure: Secondary | ICD-10-CM | POA: Insufficient documentation

## 2015-10-01 NOTE — Assessment & Plan Note (Addendum)
-   INR 5.1 today. Will check CBC. - Last dose of Lovenox yesterday. Currently prescribed Warfarin 5mg  - Will skip today's (09/28/15) dose of Warfarin and then transition to Warfarin 2.5mg  daily. Recheck INR Monday and adjust dose as needed. - Prescription for INR checks filled out and faxed to [facility] for INR checks. Notes she has difficulty leaving home.  Note contact # for [facility] [phone], Fax [phone]

## 2015-10-01 NOTE — Assessment & Plan Note (Signed)
-   On Echocardiogram 09/22/15. With Preserved EF 60-65%. Grade 2 Diastolic Dysfunction. - Weight stable. Symptoms unchanged from baseline. - Continue to monitor

## 2015-10-01 NOTE — Progress Notes (Signed)
Subjective:     Patient ID: Caroline Lewis, female   DOB: October 30, 1924, 81 y.o.   MRN: [id number]  HPI Mrs. Fulgham is a 81yo female presenting today for hospital follow up.  # Summary of Hospitalization:  Admitted 09/20/2015. Discharged 09/25/2015  Initially admitted for hematemesis, which resolved. Also diagnosed with DVT during hospitalization with doppler of left leg. No PE noted.  EGD on 10/27: moderate hiatal hernia, severe ulcerative linear esophagitis, atrophic gastritis, candida esophagitis  Echocardiogram on 10/28 with EF 60-65% with grade 2 diastolic dysfunction  Prescribed Protonix, Nystatin x14 days  Discharged with Lovenox Warfarin bridge. INR goal 2-3  - Since discharge, she has continued to feel tired, but overall feels that this is improving - Notes that she has been eating better with less pain since starting protonix and Nystatin in hospital - Continues to note some shortness of breath with exertion, but states this is unchanged from her baseline. Denies chest pain, orthopnea, paroxysmal nocturnal dyspnea - Denies leg pain - Notes some swelling of feet bilaterally - Last day of Lovenox bridge was yesterday. Has been taking Warfarin 5mg  since discharge and will continue solely on Warfarin from today forward.  Review of Systems Per HPI    Objective:   Physical Exam  Constitutional: She appears well-developed and well-nourished. No distress.  Cardiovascular: Normal rate and regular rhythm.  Exam reveals no gallop and no friction rub.   No murmur heard. Pulmonary/Chest: Effort normal. No respiratory distress. She has no wheezes. She has no rales.  Abdominal: Soft. She exhibits no distension. There is no tenderness.  Musculoskeletal:  Bilateral swelling of feet noted, no calf tenderness, negative homan's bialterally, compression hose present bilaterally  Psychiatric: She has a normal mood and affect. Her behavior is normal.      Assessment and Plan:     DVT  (deep venous thrombosis), left - INR 5.1 today. Will check CBC. - Last dose of Lovenox yesterday. Currently prescribed Warfarin 5mg  - Will skip today's (09/28/15) dose of Warfarin and then transition to Warfarin 2.5mg  daily. Recheck INR Monday and adjust dose as needed. - Prescription for INR checks filled out and faxed to [facility] for INR checks. Notes she has difficulty leaving home.  Note contact # for [facility] [phone], Fax [phone]  Diastolic congestive heart failure ([place]) - On Echocardiogram 09/22/15. With Preserved EF 60-65%. Grade 2 Diastolic Dysfunction. - Weight stable. Symptoms unchanged from baseline. - Continue to monitor

## 2015-10-02 ENCOUNTER — Other Ambulatory Visit: Payer: Self-pay | Admitting: *Deleted

## 2015-10-02 ENCOUNTER — Telehealth: Payer: Self-pay | Admitting: Family Medicine

## 2015-10-02 ENCOUNTER — Other Ambulatory Visit: Payer: Self-pay | Admitting: Family Medicine

## 2015-10-02 LAB — POCT INR: INR: 3.1

## 2015-10-02 NOTE — Telephone Encounter (Signed)
Was told in hospital she needed to see a regular cardiologist. Would like to see who dr Andria Frames would recommend

## 2015-10-02 NOTE — Patient Outreach (Signed)
[facility]) Care Management  10/02/2015  Caroline Lewis 1924/06/29 [id number]   Assessment: Transition of care call Referral received from community care management coordinator Caroline Lewis) since patient decided to stay at her home in [place] instead of staying at her daughter's Caroline Lewis) house in [place]. Call placed and was able to speak to patient and her son briefly, then was directed to call patient's daughter- Caroline Lewis (emergency contact) for further information on patient. Per patient report, [facility] home health staff is in to work with her at this time.    Patient had a recent admission to the hospital on 10/26 - 10/31 with deep vein thrombosis of left leg, anemia, severe protein-calorie malnutrition and rheumatoid arthritis. Call placed and spoke with patient's daughter who reported that patient decided to stay at her home in [place] but patient's son lives with her to assist with her needs. Transition of care call completed. Patient's daughter expressed good understanding of medications and hospital stay. Patient had a follow-up appointment with Family Medicine last Thursday 09/28/15.  Transportation provided by patient's son according to daughter.  Per daughter's report, patient has issues with constipation (chronic- Irritable Bowel Syndrome) and some coughing, feeling weak/tired since she has not had her strength back yet. Patient has a scheduled follow-up appointment with primary care provider on 11/10 am Daughter agreed to initial home visit this week on Thursday.    Plan: Initial home visit for transition of care on 11/10.  Caroline Lewis A. Caroline Lewis, BSN, RN-BC [place] Management Coordinator Cell: [phone]

## 2015-10-02 NOTE — Telephone Encounter (Signed)
Called and spoke with daughter.  Main issue is follow up of HFPEF (diastolic dysfunction.)  She did not have an appointment with me.  She will make an appointment with me for this Thursday.  We can discuss the cardiology referral at that time.  I am always happy to get a second opinion.  I also can handle diastolic dysfunction and Dr. Appointments wear out the patient.  We shall see.

## 2015-10-05 ENCOUNTER — Ambulatory Visit (INDEPENDENT_AMBULATORY_CARE_PROVIDER_SITE_OTHER): Payer: Commercial Managed Care - HMO | Admitting: Family Medicine

## 2015-10-05 ENCOUNTER — Inpatient Hospital Stay: Payer: Commercial Managed Care - HMO | Admitting: Family Medicine

## 2015-10-05 ENCOUNTER — Ambulatory Visit (INDEPENDENT_AMBULATORY_CARE_PROVIDER_SITE_OTHER): Payer: Commercial Managed Care - HMO | Admitting: *Deleted

## 2015-10-05 ENCOUNTER — Other Ambulatory Visit: Payer: Self-pay | Admitting: *Deleted

## 2015-10-05 ENCOUNTER — Encounter: Payer: Self-pay | Admitting: *Deleted

## 2015-10-05 VITALS — BP 90/60 | HR 72 | Temp 98.6°F | Wt 100.1 lb

## 2015-10-05 DIAGNOSIS — J181 Lobar pneumonia, unspecified organism: Principal | ICD-10-CM

## 2015-10-05 DIAGNOSIS — J189 Pneumonia, unspecified organism: Secondary | ICD-10-CM

## 2015-10-05 DIAGNOSIS — E43 Unspecified severe protein-calorie malnutrition: Secondary | ICD-10-CM

## 2015-10-05 DIAGNOSIS — I82402 Acute embolism and thrombosis of unspecified deep veins of left lower extremity: Secondary | ICD-10-CM

## 2015-10-05 DIAGNOSIS — I272 Other secondary pulmonary hypertension: Secondary | ICD-10-CM

## 2015-10-05 DIAGNOSIS — D62 Acute posthemorrhagic anemia: Secondary | ICD-10-CM

## 2015-10-05 DIAGNOSIS — K219 Gastro-esophageal reflux disease without esophagitis: Secondary | ICD-10-CM | POA: Diagnosis not present

## 2015-10-05 LAB — POCT HEMOGLOBIN: Hemoglobin: 10.5 g/dL — AB (ref 12.2–16.2)

## 2015-10-05 LAB — POCT INR: INR: 1.9

## 2015-10-05 MED ORDER — LEVOFLOXACIN 250 MG PO TABS: 250.0000 mg | ORAL_TABLET | Freq: Every day | ORAL | Status: DC

## 2015-10-05 NOTE — Patient Instructions (Signed)
Pick up the antibiotic for the lung infection today. You need to make two changes while on the antibiotic. 1. Stop the iron.  Restart after completing the antibiotic. 2. Take the coumadin every other day when on the antibiotic.  The get back to every day except Monday once the antibiotic is complete. See me in two weeks. Eat, eat, eat

## 2015-10-05 NOTE — Patient Outreach (Signed)
[facility]) Care Management   10/05/2015  Caroline Lewis 1924/08/18 [id number]  Caroline Lewis is an 81 y.o. female  Subjective: Patient states "feeling okay", "still tires easily". She reports coming from her doctor's appointment (Dr. Andria Frames) today and was told to eat and not to loose anymore weight.  Objective:  BP 96/58 mmHg  Pulse 99  Resp 20  SpO2 95%  Review of Systems  Constitutional: Positive for weight loss and malaise/fatigue.  HENT: Positive for congestion and hearing loss.   Eyes: Negative.        Wears eyeglasses History of macular degeneration  Respiratory: Positive for cough and sputum production. Negative for shortness of breath.        Productive coughing with yellowish green sputum Crackles to left lobe.   Cardiovascular: Positive for leg swelling.       Chronic lower extremity edema, 2+ pitting, left leg greater than right. Right has ankle swelling and left leg swelling from shin down. Tachycardic on upper 90's   Gastrointestinal: Positive for constipation.       Abdomen soft, non-tender with hyperactive bowels sounds   Genitourinary: Positive for frequency.  Musculoskeletal: Negative.  Negative for falls.  Skin: Negative.        Pale looking Bruised spots to bilateral arms  Neurological: Negative.   Endo/Heme/Allergies: Bruises/bleeds easily.       On Coumadin  Psychiatric/Behavioral: Negative.     Physical Exam  Current Medications:   Current Outpatient Prescriptions  Medication Sig Dispense Refill  . docusate sodium (ENEMEEZ) 283 MG enema Place 1 enema (283 mg total) rectally daily. 10 each 0  . folic acid (FOLVITE) 1 MG tablet Take 1 mg by mouth daily.    Marland Kitchen levofloxacin (LEVAQUIN) 250 MG tablet Take 1 tablet (250 mg total) by mouth daily. 5 tablet 0  . methotrexate (RHEUMATREX) 2.5 MG tablet Take 20 mg by mouth once a week. Caution:Chemotherapy. Protect from light. Take on Fridays    . nystatin (MYCOSTATIN) 100000  UNIT/ML suspension Take 5 mLs (500,000 Units total) by mouth 4 (four) times daily. 220 mL 0  . omeprazole (PRILOSEC) 40 MG capsule Take 1 capsule (40 mg total) by mouth daily. 90 capsule 3  . polyethylene glycol (MIRALAX / GLYCOLAX) packet Take 17 g by mouth daily as needed for mild constipation.    Marland Kitchen tolterodine (DETROL LA) 2 MG 24 hr capsule Take 1 capsule (2 mg total) by mouth daily. 30 capsule 6  . warfarin (COUMADIN) 5 MG tablet Take 1 tablet (5 mg total) by mouth one time only at 6 PM. 30 tablet 0  . enoxaparin (LOVENOX) 40 MG/0.4ML injection Inject 0.45 mLs (45 mg total) into the skin daily. (Patient not taking: Reported on 10/05/2015) 0.9 mL 0  . ferrous sulfate 325 (65 FE) MG tablet Take 1 tablet (325 mg total) by mouth 3 (three) times daily with meals. (Patient not taking: Reported on 09/20/2015)  3  . HYDROcodone-acetaminophen (NORCO) 10-325 MG tablet Take 1 tablet by mouth every 8 (eight) hours as needed for moderate pain. (Patient not taking: Reported on 09/20/2015) 270 tablet 0  . magnesium citrate SOLN Take 148 mLs (0.5 Bottles total) by mouth once. (Patient not taking: Reported on 09/20/2015) 195 mL 1  . ondansetron (ZOFRAN) 4 MG tablet Take 1 tablet (4 mg total) by mouth every 8 (eight) hours as needed for nausea or vomiting. (Patient not taking: Reported on 09/20/2015) 30 tablet 0  . Simethicone 80 MG TABS Take  1 tablet (80 mg total) by mouth 4 (four) times daily -  before meals and at bedtime. (Patient not taking: Reported on 09/20/2015) 60 tablet 0   No current facility-administered medications for this visit.    Functional Status:   In your present state of health, do you have any difficulty performing the following activities: 10/05/2015 09/21/2015  Hearing? Y N  Vision? N N  Difficulty concentrating or making decisions? Y N  Walking or climbing stairs? Y Y  Dressing or bathing? Y N  Doing errands, shopping? Y N  Preparing Food and eating ? Y -  Using the Toilet? N -   In the past six months, have you accidently leaked urine? Y -  Do you have problems with loss of bowel control? N -  Managing your Medications? Y -  Managing your Finances? N -  Housekeeping or managing your Housekeeping? Y -    Fall/Depression Screening:    PHQ 2/9 Scores 10/05/2015 09/28/2015 09/19/2015 09/06/2015 06/16/2015 03/24/2015 09/28/2014  PHQ - 2 Score 2 2 0 0 1 0 0  PHQ- 9 Score 13 - - - - - -    Assessment:   81 year old, frail and pale- looking woman who had recent hospitalization from 10/26 to 10/31 related to left leg blood clot, anemia, severe protein calorie malnutrition and rheumatoid arthritis. Patient, son Caroline Lewis) and daughter Caroline Lewis) were present during the visit. Patient was sitting on her recliner with legs elevated. Daughter reports that patient has rheumatoid arthritis. She is not able to walk. She uses a "power chair" or electric wheelchair. She is able to transfer self from "power chair" to her recliner and able to maneuver herself to the bathroom and transfer to the commode per daughter. Son lives with patient and cooks for her as well as do houseworks for her. [facility] is currently providing home health services (physical therapy, nurse and occupational therapy) for patient. Patient had her follow-up appointment with primary care provider (Dr.Hensel) this morning. She was started on a 5 day antibiotics (Levaquin) for left lower lobe pneumonia. Patient was told to eat and gain weight as stated. Patient weighed 100 pounds and her hemoglobin level is 10.5 today. Per daughter, primary care provider did not think that patient needs a cardiologist at this time.  Patient's son and daughter manages her medications and housekeeping for her. Daughter verbalized obtaining weighing scale to check on patient's weight. Educated patient and daughter about monitoring sodium intake due to swelling to bilateral extremities. Encouraged patient to elevate legs. Education was also  provided regarding food rich in Iron (patient likes liver) and limiting "greens" in relation to Coumadin treatment. Daughter mentioned that patient will be started on nutritional supplement (Boost) and will prepare more homemade foods to avoid canned food/soups with high sodium content. Daughter is aware to hydrate or increase fluid intake and offer food rich in calories and protein for patient. Patient states will work on being able to sleep more at nights to regain her strength back.  Patient's daughter was able to schedule a follow-up appointment with primary care provider on 10/26/15. She agreed to transition of care call next week. Patient denies any other needs at this time. Encouraged to call [facility], care management coordinator and 24-hour nurse line if needed. Contact information provided.   Plan: Transition of care call on 11/16. Will mail EMMI educational materials on pneumonia and food list that is helpful to patient.    Tekila Caillouet A. Meika Earll, BSN, RN-BC [place]  Management Coordinator Cell: [phone]  [facility] CM Care Plan Problem One        Most Recent Value   Care Plan Problem One  at risk for readmission   Role Documenting the Problem One  Care Management [place] for Problem One  Active   THN Long Term Goal (31-90 days)  patient will not be readmitted in the next 31 days   THN Long Term Goal Start Date  10/05/15   Interventions for Problem One Long Term Goal  encourage medication adherence, attend follow-up doctors' appoinments,  remind to take medications (antibiotics-for lung infection) and complete the regimen,  encourage to eat nutritious food- high calorie, high protein and foods rich in Iron (like liver- pt. likes),  instruct patient to hydrate by drinking fluids and beverages she can tolerate like:Gatorade, nutritional  supplements(Boost),  encourage to learn reading food labels to avoid sodium   THN CM Short Term Goal #1 (0-30 days)  patient will report  no signs of bleeding in the next 30 days   THN CM Short Term Goal #1 Start Date  10/05/15   Interventions for Short Term Goal #1  encourage patient to watch out for any signs of bleeding from any body parts, report if any,  remind patient to use Miralax or enema as ordered to prevent constipation thus prevent bleeding,  remind to take Iron as instructed (stop Iron while on antibiotics),  encourage follow-up with provider for blood works (like Hemoglobin and INR) to monitor for bleeding tendencies,  encourage adherence to medications (take Coumadin every other day while on antibiotics as instructed)    THN CM Short Term Goal #2 (0-30 days)  patient will report increase or maintained weight (100 pounds) in the next 30 days   THN CM Short Term Goal #2 Start Date  10/05/15   Interventions for Short Term Goal #2  advise family to prepare and cook food that patient prefers that are rich in calories and protein,  encourage use of nutritional supplement like Boost,  reinforce use of nystatin to treat oral thrush so patient can eat better,  encourage to monitor weight at least once a week,  reinforce increase fluid and food intake,  remind to avoid food with acids (ulcerated esophagus)                Plan: Transition of care call on 11/16.

## 2015-10-06 ENCOUNTER — Encounter: Payer: Self-pay | Admitting: *Deleted

## 2015-10-06 ENCOUNTER — Encounter: Payer: Self-pay | Admitting: Family Medicine

## 2015-10-06 NOTE — Assessment & Plan Note (Signed)
No evidence of volume overload.  Her edema is from her DVT

## 2015-10-06 NOTE — Assessment & Plan Note (Signed)
Stable.  Hold iron while on floroquinolone.

## 2015-10-06 NOTE — Assessment & Plan Note (Signed)
Eat.  Did problem solving with focus on not just nutritional supplements (like ensure) but other high calorie drinks such as carnation instant breakfast, smoothies and milkshakes.  States she is going to get a big mack after this visit.

## 2015-10-06 NOTE — Assessment & Plan Note (Signed)
Stable on coumadin.

## 2015-10-06 NOTE — Progress Notes (Signed)
   Subjective:    Patient ID: Caroline Lewis, female    DOB: [birth year], 81 y.o.   MRN: [id number]  HPI Hospital follow up. Hospitalized with GI bleed and found to have an acute DVT.  Here for follow up. Additionally complains of being very weak.  Has had a cough for three days.  Cough seems to be better today.  No complaints of fever or shortness of breath.  Appetite is poor.  Feels like she can only eat a few bites then is full No GI bleeding noted.  No vomiting, just early satiety.     Review of Systems     Objective:   Physical Exam Gen Pale and frail Wt is as low as it has been. Lungs - crackles in left base which were not present at DC Cardiac RRR with 1/6 SEM Ext 2+ edema left leg, trace, right leg. INR=1.9 Hgb=10.0        Assessment & Plan:

## 2015-10-06 NOTE — Assessment & Plan Note (Signed)
Will call based on cough and physical exam.  HCAP.  Will use levofloxacin and adjust coumadin and iron.

## 2015-10-11 ENCOUNTER — Other Ambulatory Visit: Payer: Self-pay | Admitting: *Deleted

## 2015-10-11 NOTE — Patient Outreach (Signed)
[facility]) Care Management  10/11/2015  BREINDY TRIMARCO 07/08/1924 [id number]   Assessment: Transition of care call Call placed and spoke with patient and son. Patient reports "feeling better" and son reports that patient is noted to be  "doing a lot better". According to son, patient is seen by home health [facility]) physical therapist and nurse twice a week.  Patient's son reports completion of Levaquin antibiotics on 11/15 with noted improvement. Reminded son to resume Iron and Coumadin as instructed. He states that patient has not complained of throat or mouth pain and her oral thrush has gotten a lot better with Nystatin which patient has a few more doses to use. Ms. Volner mentions that her appetite is okay and sleep is fair. Son states that patient had been eating very regularly and patient takes naps during the day that somewhat affects with her sleep at night but not something that would make her sleep deprived, per son. Patient also started using Boost for nutritional supplement.   Patient denies any bleeding and she reports having a black stool. Son and patient made aware that Iron can cause it. Encouraged patient to continue monitoring for signs of bleeding and report if any. Ms. Boring reports still having cough with greenish to clear mucus production but denies any shortness of breath. Patient was encouraged to increase hydration and do breathing exercises regularly to help in clearing up her lungs.  Patient's son verbalize that weighing scale will be obtained to monitor patient's weight.  Son and patient was made aware that EMMI educational materials regarding pneumonia, malnutrition, low salt diet and healthy diet was sent through mail. Encourage to read these materials when received and ask questions if any.  Reminded patient regarding follow-up doctor's appointments and son states that appointment with cardiologist was cancelled since primary care  provider does not think it is necessary at this time. Primary care provider's follow-up appointment is 12/1.  Patient and son agreed to transition of care call next week. She denies of any urgent needs or concerns at this time. Encourage to call [facility], care management coordinator or 24-hour nurse line if necessary.   Plan: Transition of care call on 11/22.  Peta Peachey A. Deserae Jennings, BSN, RN-BC [place] Management Coordinator Cell: [phone]

## 2015-10-12 ENCOUNTER — Telehealth: Payer: Self-pay | Admitting: Family Medicine

## 2015-10-12 MED ORDER — FESOTERODINE FUMARATE ER 8 MG PO TB24: 8.0000 mg | ORAL_TABLET | Freq: Every day | ORAL | Status: AC

## 2015-10-12 NOTE — Telephone Encounter (Signed)
Pt son calling to report that pt was recently switched to tolterodine (DETROL LA) 2 MG for bladder control and now she is having a hard time because she is going way too much. Pt son would like to request to have the pt switched back to [place] as that seemed to work well with her. If applicable please send medication to [facility] on [address]. Thank you, Fonda Kinder, ASA

## 2015-10-12 NOTE — Telephone Encounter (Signed)
Will forward to MD for review. Idan Prime,CMA

## 2015-10-17 ENCOUNTER — Other Ambulatory Visit: Payer: Self-pay | Admitting: *Deleted

## 2015-10-17 NOTE — Patient Outreach (Signed)
[facility]) Care Management  10/17/2015  Caroline Lewis May 10, 1924 [id number]   Assessment: Transition of care call - week 2 Call placed and spoke with patient who reports "feeling ok". She shares that her daughter Jeani Hawking from out [place] is in to visit her and stay for few weeks. Patient mentions receiving EMMI educational materials and food lists provided for her through mail but no time to read it yet, just browsed the food list as stated by patient. According to patient, she has finished using Nystatin oral solution and her oral thrush is better which allows her to eat very well. Although her sleep is not that great as reported. She states sleeping 1-2 hours ("cat naps") at night then she wakes up, but able to get back to sleep again by turning on the television. She denies being stressed or bothered by anything that could cause her to wake up several times at night. Encouraged patient to move around and do more activities during the day to avoid taking several naps that could interfere with her night time sleep.   Medications reviewed and Detrol (causes her to void too much) was switched back to [place] as this works better for her. Per patient, her hydration and nutrition is "pretty well". She reports no bleeding noted. Ms. Strawther reports currently coughing "just a little" and expectorating mucus "barely as much- with very slight yellowish green color at times but sometimes clear". Denies any breathing difficulties. She states swelling to lower extremities persist related to blood clot. She agreed to keep both legs elevated as often to help decrease edema.  [facility] home [facility] physical therapist and nurse continue to work with patient as stated. Patient shares that weighing scale has not been obtained yet. She reports that she will be back for follow-up visit with primary care provider on 12/1 and will check her weight then.  Patient agreed to transition of care call next  week. She states that she will call if she needs to be visited by care management coordinator. Contact information with patient.     Plan: Transition of care call on 12/1.  Raya Mckinstry A. Deeya Richeson, BSN, RN-BC [place] Management Coordinator Cell: [phone]

## 2015-10-18 ENCOUNTER — Ambulatory Visit: Payer: Commercial Managed Care - HMO | Admitting: Internal Medicine

## 2015-10-25 ENCOUNTER — Emergency Department (HOSPITAL_COMMUNITY): Payer: Commercial Managed Care - HMO

## 2015-10-25 ENCOUNTER — Encounter (HOSPITAL_COMMUNITY): Payer: Self-pay | Admitting: Emergency Medicine

## 2015-10-25 ENCOUNTER — Inpatient Hospital Stay (HOSPITAL_COMMUNITY)
Admission: EM | Admit: 2015-10-25 | Discharge: 2015-11-26 | DRG: 388 | Disposition: E | Payer: Commercial Managed Care - HMO | Attending: Family Medicine | Admitting: Family Medicine

## 2015-10-25 ENCOUNTER — Telehealth: Payer: Self-pay | Admitting: Family Medicine

## 2015-10-25 DIAGNOSIS — K56609 Unspecified intestinal obstruction, unspecified as to partial versus complete obstruction: Secondary | ICD-10-CM

## 2015-10-25 DIAGNOSIS — M069 Rheumatoid arthritis, unspecified: Secondary | ICD-10-CM | POA: Diagnosis present

## 2015-10-25 DIAGNOSIS — K920 Hematemesis: Secondary | ICD-10-CM | POA: Diagnosis present

## 2015-10-25 DIAGNOSIS — I272 Other secondary pulmonary hypertension: Secondary | ICD-10-CM | POA: Diagnosis present

## 2015-10-25 DIAGNOSIS — E43 Unspecified severe protein-calorie malnutrition: Secondary | ICD-10-CM | POA: Diagnosis present

## 2015-10-25 DIAGNOSIS — M810 Age-related osteoporosis without current pathological fracture: Secondary | ICD-10-CM | POA: Diagnosis present

## 2015-10-25 DIAGNOSIS — R000 Tachycardia, unspecified: Secondary | ICD-10-CM | POA: Diagnosis not present

## 2015-10-25 DIAGNOSIS — D72829 Elevated white blood cell count, unspecified: Secondary | ICD-10-CM | POA: Insufficient documentation

## 2015-10-25 DIAGNOSIS — Z96641 Presence of right artificial hip joint: Secondary | ICD-10-CM | POA: Diagnosis present

## 2015-10-25 DIAGNOSIS — I739 Peripheral vascular disease, unspecified: Secondary | ICD-10-CM | POA: Diagnosis present

## 2015-10-25 DIAGNOSIS — D638 Anemia in other chronic diseases classified elsewhere: Secondary | ICD-10-CM | POA: Diagnosis present

## 2015-10-25 DIAGNOSIS — K5909 Other constipation: Secondary | ICD-10-CM | POA: Diagnosis present

## 2015-10-25 DIAGNOSIS — R0600 Dyspnea, unspecified: Secondary | ICD-10-CM

## 2015-10-25 DIAGNOSIS — R05 Cough: Secondary | ICD-10-CM

## 2015-10-25 DIAGNOSIS — K5660 Unspecified intestinal obstruction: Principal | ICD-10-CM | POA: Diagnosis present

## 2015-10-25 DIAGNOSIS — Z7901 Long term (current) use of anticoagulants: Secondary | ICD-10-CM

## 2015-10-25 DIAGNOSIS — J189 Pneumonia, unspecified organism: Secondary | ICD-10-CM | POA: Insufficient documentation

## 2015-10-25 DIAGNOSIS — C182 Malignant neoplasm of ascending colon: Secondary | ICD-10-CM | POA: Diagnosis present

## 2015-10-25 DIAGNOSIS — K589 Irritable bowel syndrome without diarrhea: Secondary | ICD-10-CM | POA: Diagnosis present

## 2015-10-25 DIAGNOSIS — Z96652 Presence of left artificial knee joint: Secondary | ICD-10-CM | POA: Diagnosis present

## 2015-10-25 DIAGNOSIS — R509 Fever, unspecified: Secondary | ICD-10-CM

## 2015-10-25 DIAGNOSIS — Z515 Encounter for palliative care: Secondary | ICD-10-CM | POA: Diagnosis present

## 2015-10-25 DIAGNOSIS — K210 Gastro-esophageal reflux disease with esophagitis: Secondary | ICD-10-CM | POA: Diagnosis present

## 2015-10-25 DIAGNOSIS — I452 Bifascicular block: Secondary | ICD-10-CM | POA: Diagnosis present

## 2015-10-25 DIAGNOSIS — Y95 Nosocomial condition: Secondary | ICD-10-CM

## 2015-10-25 DIAGNOSIS — Z66 Do not resuscitate: Secondary | ICD-10-CM

## 2015-10-25 DIAGNOSIS — I5032 Chronic diastolic (congestive) heart failure: Secondary | ICD-10-CM | POA: Diagnosis present

## 2015-10-25 DIAGNOSIS — R54 Age-related physical debility: Secondary | ICD-10-CM | POA: Diagnosis present

## 2015-10-25 DIAGNOSIS — Z86718 Personal history of other venous thrombosis and embolism: Secondary | ICD-10-CM

## 2015-10-25 DIAGNOSIS — R059 Cough, unspecified: Secondary | ICD-10-CM | POA: Insufficient documentation

## 2015-10-25 DIAGNOSIS — K449 Diaphragmatic hernia without obstruction or gangrene: Secondary | ICD-10-CM | POA: Diagnosis present

## 2015-10-25 DIAGNOSIS — R6510 Systemic inflammatory response syndrome (SIRS) of non-infectious origin without acute organ dysfunction: Secondary | ICD-10-CM | POA: Diagnosis present

## 2015-10-25 DIAGNOSIS — Z681 Body mass index (BMI) 19 or less, adult: Secondary | ICD-10-CM

## 2015-10-25 DIAGNOSIS — K5669 Other intestinal obstruction: Secondary | ICD-10-CM | POA: Diagnosis not present

## 2015-10-25 DIAGNOSIS — Z888 Allergy status to other drugs, medicaments and biological substances status: Secondary | ICD-10-CM

## 2015-10-25 DIAGNOSIS — Z79899 Other long term (current) drug therapy: Secondary | ICD-10-CM

## 2015-10-25 DIAGNOSIS — Z87891 Personal history of nicotine dependence: Secondary | ICD-10-CM

## 2015-10-25 DIAGNOSIS — R64 Cachexia: Secondary | ICD-10-CM | POA: Diagnosis present

## 2015-10-25 DIAGNOSIS — R1310 Dysphagia, unspecified: Secondary | ICD-10-CM | POA: Diagnosis present

## 2015-10-25 DIAGNOSIS — K6389 Other specified diseases of intestine: Secondary | ICD-10-CM | POA: Diagnosis present

## 2015-10-25 LAB — COMPREHENSIVE METABOLIC PANEL
ALBUMIN: 2.7 g/dL — AB (ref 3.5–5.0)
ALK PHOS: 91 U/L (ref 38–126)
ALT: 14 U/L (ref 14–54)
AST: 21 U/L (ref 15–41)
Anion gap: 11 (ref 5–15)
BUN: 12 mg/dL (ref 6–20)
CALCIUM: 8.3 mg/dL — AB (ref 8.9–10.3)
CO2: 23 mmol/L (ref 22–32)
CREATININE: 0.64 mg/dL (ref 0.44–1.00)
Chloride: 101 mmol/L (ref 101–111)
GFR calc non Af Amer: >60 mL/min (ref >60)
GLUCOSE: 154 mg/dL — AB (ref 65–99)
Potassium: 4.3 mmol/L (ref 3.5–5.1)
SODIUM: 135 mmol/L (ref 135–145)
Total Bilirubin: 0.7 mg/dL (ref 0.3–1.2)
Total Protein: 6.5 g/dL (ref 6.5–8.1)

## 2015-10-25 LAB — CBC
HCT: 39.5 % (ref 36.0–46.0)
Hemoglobin: 12 g/dL (ref 12.0–15.0)
MCH: 25.8 pg — AB (ref 26.0–34.0)
MCHC: 30.4 g/dL (ref 30.0–36.0)
MCV: 84.9 fL (ref 78.0–100.0)
PLATELETS: 273 10*3/uL (ref 150–400)
RBC: 4.65 MIL/uL (ref 3.87–5.11)
RDW: 26.9 % — AB (ref 11.5–15.5)
WBC: 19.1 10*3/uL — ABNORMAL HIGH (ref 4.0–10.5)

## 2015-10-25 LAB — LIPASE, BLOOD: Lipase: 24 U/L (ref 11–51)

## 2015-10-25 LAB — I-STAT CG4 LACTIC ACID, ED
LACTIC ACID, VENOUS: 1.69 mmol/L (ref 0.5–2.0)
Lactic Acid, Venous: 0.79 mmol/L (ref 0.5–2.0)

## 2015-10-25 LAB — TROPONIN I: Troponin I: 0.04 ng/mL — ABNORMAL HIGH (ref <0.031)

## 2015-10-25 MED ORDER — ACETAMINOPHEN 325 MG PO TABS
650.0000 mg | ORAL_TABLET | Freq: Once | ORAL | Status: AC
  Administered 2015-10-25: 650 mg via ORAL
  Filled 2015-10-25: qty 2

## 2015-10-25 MED ORDER — IOHEXOL 300 MG/ML  SOLN
80.0000 mL | Freq: Once | INTRAMUSCULAR | Status: AC | PRN
  Administered 2015-10-25: 80 mL via INTRAVENOUS

## 2015-10-25 MED ORDER — IOHEXOL 300 MG/ML  SOLN
25.0000 mL | INTRAMUSCULAR | Status: AC
  Administered 2015-10-25 (×2): 25 mL via ORAL

## 2015-10-25 MED ORDER — SODIUM CHLORIDE 0.9 % IV BOLUS (SEPSIS)
500.0000 mL | Freq: Once | INTRAVENOUS | Status: AC
  Administered 2015-10-25: 500 mL via INTRAVENOUS

## 2015-10-25 NOTE — ED Notes (Signed)
Patient transported to CT 

## 2015-10-25 NOTE — Telephone Encounter (Signed)
Contacted Star with [facility] in [place] and gave her the verbal order for inr every two weeks.  She stated that pt has appointment in the morning her at office and I told her that they would probably check that here and then she could check every 2 weeks per Dr. Andria Frames.  Also in reference to the constipation she stated that she had already spoken to someone here at our office. Pt will be in to see PCP tomorrow and will have INR checked. Katharina Caper, Arvo Ealy D, [place]

## 2015-10-25 NOTE — Telephone Encounter (Signed)
Please call home health and give verbal order to check INR q2weeks.

## 2015-10-25 NOTE — ED Notes (Signed)
Pt c/o Generalized sharp abd pain, nausea and vomiting, pt is been using enemas at home to have some BM with minimal relief, Hx of SBO. Vitals 97.9 temp, 118/78, HR114, R 16, 94% on RA.

## 2015-10-25 NOTE — ED Provider Notes (Signed)
CSN: [id number]     Arrival date & time 10/24/2015  1912 History   First MD Initiated Contact with Patient 10/19/2015 1919     Chief Complaint  Patient presents with  . Abdominal Pain  . Fatigue     (Consider location/radiation/quality/duration/timing/severity/associated sxs/prior Treatment) HPI  Caroline Lewis is a 81 y.o. female with PMH significant for IBS, RA, constipation, and PVD who presents with gradual onset, worsening, moderate, sharp, waxing and waning generalized non-radiating abdominal pain.  No meds tried PTA.  She has been using enemas to have BMs and found some relief.  Last BM 2 days ago.  Associated symptoms include N/V, cough (started here, "feels like tickle in my throat).  Denies fevers, CP, SOB, urinary symptoms, or bloody stools.  She reports a history of SBO.   Past Medical History  Diagnosis Date  . Rheumatoid arthritis(714.0)   . IBS (irritable bowel syndrome)   . Constipation   . Allergy     seasonal  . Osteoporosis   . Sciatica   . Peripheral vascular disease ([place])     "beyond help"  . Pneumonia 05/25/12; 2010   Past Surgical History  Procedure Laterality Date  . Replacement total knee  ~ 2010    left  . Facial cosmetic surgery      left cheek  . Rotator cuff repair      right  . Bunionectomy  1990's    bilateral  . Joint replacement    . Partial hip arthroplasty  05/26/12    right  . Cataract extraction w/ intraocular lens  implant, bilateral  ~ 2003  . Eye surgery  ~ 2003    right; "precancerous; took out section in lower part of eye"  . Hip arthroplasty  05/26/2012    Procedure: ARTHROPLASTY BIPOLAR HIP;  Surgeon: Hessie Dibble, MD;  Location: [facility];  Service: Orthopedics;  Laterality: Right;  . Esophagogastroduodenoscopy N/A 09/21/2015    Procedure: ESOPHAGOGASTRODUODENOSCOPY (EGD);  Surgeon: Clarene Essex, MD;  Location: [facility] ENDOSCOPY;  Service: Endoscopy;  Laterality: N/A;   Family History  Problem Relation Age of Onset  . Lung cancer  Sister   . Uterine cancer Sister   . Colon cancer Neg Hx   . Heart disease Father   . GI problems Son   . Irritable bowel syndrome Son    Social History  Substance Use Topics  . Smoking status: Former Smoker -- 0.10 packs/day for 50 years    Types: Cigarettes    Quit date: 11/26/1987  . Smokeless tobacco: Never Used  . Alcohol Use: No   OB History    No data available     Review of Systems All other systems negative unless otherwise stated in HPI    Allergies  Fosamax  Home Medications   Prior to Admission medications   Medication Sig Start Date End Date Taking? Authorizing Provider  docusate sodium (ENEMEEZ) 283 MG enema Place 1 enema (283 mg total) rectally daily. Patient taking differently: Place 1 enema rectally daily as needed for moderate constipation.  09/06/15  Yes Archie Patten, MD  fesoterodine (TOVIAZ) 8 MG TB24 tablet Take 1 tablet (8 mg total) by mouth daily. 10/12/15  Yes Zenia Resides, MD  folic acid (FOLVITE) 1 MG tablet Take 1 mg by mouth daily.   Yes Historical Provider, MD  methotrexate (RHEUMATREX) 2.5 MG tablet Take 20 mg by mouth once a week. Caution:Chemotherapy. Protect from light. Take on Fridays   Yes Historical Provider,  MD  omeprazole (PRILOSEC) 40 MG capsule Take 1 capsule (40 mg total) by mouth daily. 09/19/15  Yes Vivi Barrack, MD  sennosides-docusate sodium (SENOKOT-S) 8.6-50 MG tablet Take 1 tablet by mouth daily as needed for constipation.   Yes Historical Provider, MD  warfarin (COUMADIN) 5 MG tablet Take 1 tablet (5 mg total) by mouth one time only at 6 PM. Patient taking differently: Take 2.5 mg by mouth daily at 6 PM. HOLD DOSE ON Monday'S EACH [place] NEXT INR CHECK 09/25/15  Yes Mercy Riding, MD  enoxaparin (LOVENOX) 40 MG/0.4ML injection Inject 0.45 mLs (45 mg total) into the skin daily. Patient not taking: Reported on 10/05/2015 09/26/15   Smiley Houseman, MD  ferrous sulfate 325 (65 FE) MG tablet Take 1 tablet (325 mg  total) by mouth 3 (three) times daily with meals. 09/19/15   Vivi Barrack, MD  HYDROcodone-acetaminophen (NORCO) 10-325 MG tablet Take 1 tablet by mouth every 8 (eight) hours as needed for moderate pain. Patient not taking: Reported on 09/20/2015 09/20/15   Zenia Resides, MD  levofloxacin (LEVAQUIN) 250 MG tablet Take 1 tablet (250 mg total) by mouth daily. Patient not taking: Reported on 10/11/2015 10/05/15   Zenia Resides, MD  magnesium citrate SOLN Take 148 mLs (0.5 Bottles total) by mouth once. Patient not taking: Reported on 09/20/2015 09/06/15   Archie Patten, MD  nystatin (MYCOSTATIN) 100000 UNIT/ML suspension Take 5 mLs (500,000 Units total) by mouth 4 (four) times daily. Patient not taking: Reported on 10/17/2015 09/25/15   Mercy Riding, MD  ondansetron (ZOFRAN) 4 MG tablet Take 1 tablet (4 mg total) by mouth every 8 (eight) hours as needed for nausea or vomiting. Patient not taking: Reported on 10/11/2015 09/19/15   Vivi Barrack, MD  Simethicone 80 MG TABS Take 1 tablet (80 mg total) by mouth 4 (four) times daily -  before meals and at bedtime. Patient not taking: Reported on 09/20/2015 09/06/15   Archie Patten, MD   BP 107/71 mmHg  Pulse 102  Temp(Src) 98.3 F (36.8 C) (Oral)  Resp 20  Ht 5' (1.524 m)  Wt 48.988 kg  BMI 21.09 kg/m2  SpO2 94% Physical Exam  Constitutional: She is oriented to person, place, and time. She appears well-developed and well-nourished.  HENT:  Head: Normocephalic and atraumatic.  Mouth/Throat: Oropharynx is clear and moist.  Eyes: Conjunctivae are normal. Pupils are equal, round, and reactive to light.  Neck: Normal range of motion. Neck supple.  Cardiovascular: Regular rhythm and normal heart sounds.  Tachycardia present.   No murmur heard. Pulmonary/Chest: Effort normal and breath sounds normal. No accessory muscle usage or stridor. No respiratory distress. She has no wheezes. She has no rhonchi. She has no rales.  Abdominal:  Soft. Bowel sounds are normal. She exhibits no distension. There is no tenderness. There is no rebound and no guarding.  Musculoskeletal: Normal range of motion.  Lymphadenopathy:    She has no cervical adenopathy.  Neurological: She is alert and oriented to person, place, and time.  Speech clear without dysarthria.  Skin: Skin is warm and dry.  Psychiatric: She has a normal mood and affect. Her behavior is normal.    ED Course  Procedures (including critical care time) Labs Review Labs Reviewed  COMPREHENSIVE METABOLIC PANEL - Abnormal; Notable for the following:    Glucose, Bld 154 (*)    Calcium 8.3 (*)    Albumin 2.7 (*)    All other  components within normal limits  CBC - Abnormal; Notable for the following:    WBC 19.1 (*)    MCH 25.8 (*)    RDW 26.9 (*)    All other components within normal limits  TROPONIN I - Abnormal; Notable for the following:    Troponin I 0.04 (*)    All other components within normal limits  LIPASE, BLOOD  PROTIME-INR  I-STAT CG4 LACTIC ACID, ED  I-STAT CG4 LACTIC ACID, ED    Imaging Review Dg Chest 2 View  10/21/2015  CLINICAL DATA:  Epigastric pain.  nausea and vomiting. EXAM: CHEST  2 VIEW COMPARISON:  09/20/2015 FINDINGS: Chronic interstitial lung disease again seen with bibasilar predominance. No evidence of acute infiltrate or pleural effusion. Heart size remains at the upper limits of normal and stable. IMPRESSION: Chronic basilar predominant interstitial lung disease. No acute findings. Electronically Signed   By: Earle Gell M.D.   On: 10/05/2015 20:30   Ct Abdomen Pelvis W Contrast  10/01/2015  CLINICAL DATA:  Generalized sharp abdominal pain. Nausea and vomiting. EXAM: CT ABDOMEN AND PELVIS WITH CONTRAST TECHNIQUE: Multidetector CT imaging of the abdomen and pelvis was performed using the standard protocol following bolus administration of intravenous contrast. CONTRAST:  73mL OMNIPAQUE IOHEXOL 300 MG/ML  SOLN COMPARISON:  07/22/2015  FINDINGS: Lower chest:  Bibasilar fibrosis.  Normal heart size. Hepatobiliary: Normal liver. Mild intrahepatic biliary ductal dilatation. Normal gallbladder. Pancreas: Normal. Spleen: Normal. Adrenals/Urinary Tract: Normal adrenal glands. No solid renal mass. No obstructive uropathy. No urolithiasis. Decompressed bladder. Stomach/Bowel: Moderate-sized hiatal hernia. Severe small bowel dilatation measuring up to 3.7 cm. The small bowel is diffusely dilated. There is dilatation of the ascending colon with an air-fluid level with a relative pointer transition in the mid ascending colon where there is a mass slight soft tissue prominence. No pneumatosis, pneumoperitoneum or portal venous gas. Vascular/Lymphatic: Normal caliber abdominal aorta with atherosclerosis. Other: No fluid collection or hematoma. Musculoskeletal: Right total hip arthroplasty with resulting beam hardening artifact obscuring portions of the pelvis. Three cannulated screws transfixing the femoral neck. No acute osseous abnormality. No lytic or sclerotic osseous lesion. S-shaped scoliosis of the thoracolumbar spine. Severe degenerative disc disease throughout the thoracolumbar spine. Chronic T8, T9, T11, T12 and L1 vertebral body compression fractures. IMPRESSION: 1. High-grade small bowel obstruction with a transition point in the mid ascending colon. Masslike prominence in the mid ascending colon concerning for neoplasm. Recommend further evaluation with colonoscopy. Electronically Signed   By: Kathreen Devoid   On: 09/29/2015 23:55   I have personally reviewed and evaluated these images and lab results as part of my medical decision-making.   EKG Interpretation   Date/Time:  Wednesday October 25 2015 19:21:07 EST Ventricular Rate:  111 PR Interval:  148 QRS Duration: 119 QT Interval:  355 QTC Calculation: 482 R Axis:   98 Text Interpretation:  Sinus tachycardia RBBB and LPFB No significant  change since last tracing Confirmed by  Ashok Cordia  MD, Lennette Bihari ([id number]) on  09/29/2015 7:25:02 PM      MDM   Final diagnoses:  Small bowel obstruction ([place])    Patient presents with generalized abdominal pain that began yesterday.  Assoc sxs include N/V.  She is afebrile, normotensive, tachycardic at 116, regular respirations without hypoxia.  On exam, lungs CTAB, abdomen is soft and nontender, no distention, rebound, or guarding.  Will obtain CXR, lactic acid, lipase, CMP, CBC, EKG.  Will start IVF and tylenol for pain.  -lactic acid 1.69 -EKG shows  sinus tachycardia; no significant change from last tracing. -CBC shows leukocytosis of 19.1 and hgb of 12.0.  Will obtain CT abdomen and pelvis. -Troponin I 0.04 -CT abdomen/pelvis shows high grade SBO with transition point in mid ascending colon.  Masslike prominence in mid ascending colon concerning for neoplasm.  Recommend further eval with colonoscopy.  Will consult surgery.  Will admit to medicine, Dr. Ree Kida with family medicine.  Case has been discussed with and seen by Dr. Ashok Cordia who agrees with the above plan for admission.      Gloriann Loan, PA-C 10/26/15 [id number]  Lajean Saver, MD 10/27/15 [phone]

## 2015-10-25 NOTE — Telephone Encounter (Signed)
Has appointment tomorrow with me and will deal with issue then.

## 2015-10-25 NOTE — Telephone Encounter (Signed)
Daughter called because her mother is on a blood thinner and the home health nurses have not check her levels for over 2 weeks.They said that they need orders from her doctor to be able to do this. Can we call in the orders? jw

## 2015-10-25 NOTE — Telephone Encounter (Signed)
Lorenza Chick is home health nurse. When listening to pt stomach, there hypo active bowel sounds. Abdomen is distended and the lower right and left of abdomen is hard Has not had bowel mvt in 2 weeks. Pt seems more confused today.  Family wants to avoid hopsital visit.  Miralax, Sennocot doent seem to be working. Please advise

## 2015-10-25 NOTE — Telephone Encounter (Signed)
Caroline Lewis called again about constipation issue

## 2015-10-26 ENCOUNTER — Inpatient Hospital Stay (HOSPITAL_COMMUNITY): Payer: Commercial Managed Care - HMO

## 2015-10-26 ENCOUNTER — Encounter (HOSPITAL_COMMUNITY): Payer: Self-pay | Admitting: General Surgery

## 2015-10-26 ENCOUNTER — Other Ambulatory Visit: Payer: Self-pay | Admitting: *Deleted

## 2015-10-26 ENCOUNTER — Ambulatory Visit: Payer: Commercial Managed Care - HMO | Admitting: *Deleted

## 2015-10-26 ENCOUNTER — Ambulatory Visit: Payer: Commercial Managed Care - HMO | Admitting: Family Medicine

## 2015-10-26 DIAGNOSIS — Z86718 Personal history of other venous thrombosis and embolism: Secondary | ICD-10-CM | POA: Diagnosis not present

## 2015-10-26 DIAGNOSIS — R0600 Dyspnea, unspecified: Secondary | ICD-10-CM | POA: Diagnosis not present

## 2015-10-26 DIAGNOSIS — Z87891 Personal history of nicotine dependence: Secondary | ICD-10-CM | POA: Diagnosis not present

## 2015-10-26 DIAGNOSIS — D638 Anemia in other chronic diseases classified elsewhere: Secondary | ICD-10-CM | POA: Diagnosis present

## 2015-10-26 DIAGNOSIS — K5660 Unspecified intestinal obstruction: Principal | ICD-10-CM

## 2015-10-26 DIAGNOSIS — I272 Other secondary pulmonary hypertension: Secondary | ICD-10-CM | POA: Diagnosis present

## 2015-10-26 DIAGNOSIS — E46 Unspecified protein-calorie malnutrition: Secondary | ICD-10-CM | POA: Diagnosis not present

## 2015-10-26 DIAGNOSIS — Z515 Encounter for palliative care: Secondary | ICD-10-CM | POA: Diagnosis present

## 2015-10-26 DIAGNOSIS — R509 Fever, unspecified: Secondary | ICD-10-CM | POA: Diagnosis not present

## 2015-10-26 DIAGNOSIS — K920 Hematemesis: Secondary | ICD-10-CM | POA: Diagnosis present

## 2015-10-26 DIAGNOSIS — K210 Gastro-esophageal reflux disease with esophagitis: Secondary | ICD-10-CM | POA: Diagnosis present

## 2015-10-26 DIAGNOSIS — J189 Pneumonia, unspecified organism: Secondary | ICD-10-CM | POA: Diagnosis not present

## 2015-10-26 DIAGNOSIS — Z96652 Presence of left artificial knee joint: Secondary | ICD-10-CM | POA: Diagnosis present

## 2015-10-26 DIAGNOSIS — R64 Cachexia: Secondary | ICD-10-CM

## 2015-10-26 DIAGNOSIS — I452 Bifascicular block: Secondary | ICD-10-CM | POA: Diagnosis present

## 2015-10-26 DIAGNOSIS — M810 Age-related osteoporosis without current pathological fracture: Secondary | ICD-10-CM | POA: Diagnosis present

## 2015-10-26 DIAGNOSIS — K5909 Other constipation: Secondary | ICD-10-CM | POA: Diagnosis present

## 2015-10-26 DIAGNOSIS — R05 Cough: Secondary | ICD-10-CM | POA: Diagnosis not present

## 2015-10-26 DIAGNOSIS — K589 Irritable bowel syndrome without diarrhea: Secondary | ICD-10-CM | POA: Diagnosis present

## 2015-10-26 DIAGNOSIS — Z79899 Other long term (current) drug therapy: Secondary | ICD-10-CM | POA: Diagnosis not present

## 2015-10-26 DIAGNOSIS — R6510 Systemic inflammatory response syndrome (SIRS) of non-infectious origin without acute organ dysfunction: Secondary | ICD-10-CM | POA: Diagnosis present

## 2015-10-26 DIAGNOSIS — K5669 Other intestinal obstruction: Secondary | ICD-10-CM | POA: Diagnosis present

## 2015-10-26 DIAGNOSIS — K56609 Unspecified intestinal obstruction, unspecified as to partial versus complete obstruction: Secondary | ICD-10-CM | POA: Insufficient documentation

## 2015-10-26 DIAGNOSIS — Z7901 Long term (current) use of anticoagulants: Secondary | ICD-10-CM | POA: Diagnosis not present

## 2015-10-26 DIAGNOSIS — I5032 Chronic diastolic (congestive) heart failure: Secondary | ICD-10-CM | POA: Diagnosis present

## 2015-10-26 DIAGNOSIS — R000 Tachycardia, unspecified: Secondary | ICD-10-CM | POA: Diagnosis not present

## 2015-10-26 DIAGNOSIS — R54 Age-related physical debility: Secondary | ICD-10-CM | POA: Diagnosis present

## 2015-10-26 DIAGNOSIS — Z66 Do not resuscitate: Secondary | ICD-10-CM | POA: Diagnosis not present

## 2015-10-26 DIAGNOSIS — K6389 Other specified diseases of intestine: Secondary | ICD-10-CM | POA: Diagnosis present

## 2015-10-26 DIAGNOSIS — K449 Diaphragmatic hernia without obstruction or gangrene: Secondary | ICD-10-CM | POA: Diagnosis present

## 2015-10-26 DIAGNOSIS — R1310 Dysphagia, unspecified: Secondary | ICD-10-CM | POA: Diagnosis present

## 2015-10-26 DIAGNOSIS — Z96641 Presence of right artificial hip joint: Secondary | ICD-10-CM | POA: Diagnosis present

## 2015-10-26 DIAGNOSIS — D72829 Elevated white blood cell count, unspecified: Secondary | ICD-10-CM | POA: Diagnosis not present

## 2015-10-26 DIAGNOSIS — I739 Peripheral vascular disease, unspecified: Secondary | ICD-10-CM | POA: Diagnosis present

## 2015-10-26 DIAGNOSIS — Z681 Body mass index (BMI) 19 or less, adult: Secondary | ICD-10-CM | POA: Diagnosis not present

## 2015-10-26 DIAGNOSIS — E43 Unspecified severe protein-calorie malnutrition: Secondary | ICD-10-CM | POA: Diagnosis present

## 2015-10-26 DIAGNOSIS — C182 Malignant neoplasm of ascending colon: Secondary | ICD-10-CM | POA: Diagnosis present

## 2015-10-26 DIAGNOSIS — Z888 Allergy status to other drugs, medicaments and biological substances status: Secondary | ICD-10-CM | POA: Diagnosis not present

## 2015-10-26 DIAGNOSIS — M069 Rheumatoid arthritis, unspecified: Secondary | ICD-10-CM | POA: Diagnosis present

## 2015-10-26 LAB — CBC
HCT: 32.9 % — ABNORMAL LOW (ref 36.0–46.0)
Hemoglobin: 10 g/dL — ABNORMAL LOW (ref 12.0–15.0)
MCH: 25.6 pg — ABNORMAL LOW (ref 26.0–34.0)
MCHC: 30.4 g/dL (ref 30.0–36.0)
MCV: 84.1 fL (ref 78.0–100.0)
PLATELETS: 220 10*3/uL (ref 150–400)
RBC: 3.91 MIL/uL (ref 3.87–5.11)
RDW: 26.5 % — AB (ref 11.5–15.5)
WBC: 17.5 10*3/uL — AB (ref 4.0–10.5)

## 2015-10-26 LAB — PREALBUMIN
PREALBUMIN: 5.6 mg/dL — AB (ref 18–38)
Prealbumin: 5.3 mg/dL — ABNORMAL LOW (ref 18–38)

## 2015-10-26 LAB — TROPONIN I
TROPONIN I: 0.05 ng/mL — AB (ref <0.031)
TROPONIN I: 0.05 ng/mL — AB (ref <0.031)
Troponin I: 0.05 ng/mL — ABNORMAL HIGH (ref <0.031)

## 2015-10-26 LAB — BASIC METABOLIC PANEL
Anion gap: 9 (ref 5–15)
BUN: 11 mg/dL (ref 6–20)
CO2: 22 mmol/L (ref 22–32)
CREATININE: 0.48 mg/dL (ref 0.44–1.00)
Calcium: 7.5 mg/dL — ABNORMAL LOW (ref 8.9–10.3)
Chloride: 104 mmol/L (ref 101–111)
Glucose, Bld: 115 mg/dL — ABNORMAL HIGH (ref 65–99)
Potassium: 3.8 mmol/L (ref 3.5–5.1)
Sodium: 135 mmol/L (ref 135–145)

## 2015-10-26 LAB — PHOSPHORUS: Phosphorus: 3.1 mg/dL (ref 2.5–4.6)

## 2015-10-26 LAB — PROTIME-INR
INR: 1.41 (ref 0.00–1.49)
Prothrombin Time: 17.4 seconds — ABNORMAL HIGH (ref 11.6–15.2)

## 2015-10-26 LAB — MAGNESIUM: Magnesium: 1.6 mg/dL — ABNORMAL LOW (ref 1.7–2.4)

## 2015-10-26 MED ORDER — LIDOCAINE VISCOUS 2 % MT SOLN
OROMUCOSAL | Status: AC
  Filled 2015-10-26: qty 15

## 2015-10-26 MED ORDER — CHLORHEXIDINE GLUCONATE 0.12 % MT SOLN
15.0000 mL | Freq: Two times a day (BID) | OROMUCOSAL | Status: DC
  Administered 2015-10-26 – 2015-10-31 (×9): 15 mL via OROMUCOSAL
  Filled 2015-10-26 (×9): qty 15

## 2015-10-26 MED ORDER — ACETAMINOPHEN 325 MG PO TABS: 650.0000 mg | ORAL_TABLET | Freq: Four times a day (QID) | ORAL | Status: DC | PRN

## 2015-10-26 MED ORDER — SODIUM CHLORIDE 0.9 % IJ SOLN
10.0000 mL | INTRAMUSCULAR | Status: DC | PRN
  Administered 2015-10-27 – 2015-10-31 (×4): 10 mL
  Filled 2015-10-26 (×4): qty 40

## 2015-10-26 MED ORDER — LIDOCAINE HCL 1 % IJ SOLN
INTRAMUSCULAR | Status: AC
Start: 2015-10-26 — End: 2015-10-27
  Filled 2015-10-26: qty 20

## 2015-10-26 MED ORDER — IOHEXOL 300 MG/ML  SOLN
50.0000 mL | Freq: Once | INTRAMUSCULAR | Status: AC | PRN
  Administered 2015-10-26: 5 mL via INTRAVENOUS

## 2015-10-26 MED ORDER — FAT EMULSION 20 % IV EMUL
240.0000 mL | INTRAVENOUS | Status: AC
  Administered 2015-10-26: 240 mL via INTRAVENOUS
  Filled 2015-10-26: qty 250

## 2015-10-26 MED ORDER — WARFARIN - PHARMACIST DOSING INPATIENT: Freq: Every day | Status: DC

## 2015-10-26 MED ORDER — CETYLPYRIDINIUM CHLORIDE 0.05 % MT LIQD
7.0000 mL | Freq: Two times a day (BID) | OROMUCOSAL | Status: DC
  Administered 2015-10-26 – 2015-10-31 (×7): 7 mL via OROMUCOSAL

## 2015-10-26 MED ORDER — SODIUM CHLORIDE 0.9 % IJ SOLN
10.0000 mL | Freq: Two times a day (BID) | INTRAMUSCULAR | Status: DC
  Administered 2015-10-27 – 2015-10-29 (×2): 10 mL

## 2015-10-26 MED ORDER — BISACODYL 10 MG RE SUPP: 10.0000 mg | Freq: Every day | RECTAL | Status: DC | PRN

## 2015-10-26 MED ORDER — TRACE MINERALS CR-CU-MN-SE-ZN 10-1000-500-60 MCG/ML IV SOLN
INTRAVENOUS | Status: AC
  Administered 2015-10-26: 18:00:00 via INTRAVENOUS
  Filled 2015-10-26: qty 600

## 2015-10-26 MED ORDER — SODIUM CHLORIDE 0.9 % IV BOLUS (SEPSIS)
500.0000 mL | Freq: Once | INTRAVENOUS | Status: AC
  Administered 2015-10-26: 500 mL via INTRAVENOUS

## 2015-10-26 MED ORDER — MENTHOL 3 MG MT LOZG: 1.0000 | LOZENGE | OROMUCOSAL | Status: DC | PRN

## 2015-10-26 MED ORDER — DOCUSATE SODIUM 283 MG RE ENEM
1.0000 | ENEMA | Freq: Every day | RECTAL | Status: DC | PRN
  Filled 2015-10-26: qty 1

## 2015-10-26 MED ORDER — DEXTROSE-NACL 5-0.45 % IV SOLN
INTRAVENOUS | Status: DC
  Administered 2015-10-26 – 2015-10-27 (×3): via INTRAVENOUS

## 2015-10-26 MED ORDER — HEPARIN SODIUM (PORCINE) 5000 UNIT/ML IJ SOLN
5000.0000 [IU] | Freq: Three times a day (TID) | INTRAMUSCULAR | Status: DC
  Administered 2015-10-26: 5000 [IU] via SUBCUTANEOUS
  Filled 2015-10-26: qty 1

## 2015-10-26 MED ORDER — FLEET ENEMA 7-19 GM/118ML RE ENEM
1.0000 | ENEMA | Freq: Two times a day (BID) | RECTAL | Status: DC
  Administered 2015-10-26 – 2015-10-31 (×9): 1 via RECTAL
  Filled 2015-10-26 (×9): qty 1

## 2015-10-26 MED ORDER — ACETAMINOPHEN 650 MG RE SUPP
650.0000 mg | Freq: Four times a day (QID) | RECTAL | Status: DC | PRN
  Administered 2015-10-30: 650 mg via RECTAL
  Filled 2015-10-26 (×2): qty 1

## 2015-10-26 MED ORDER — LORAZEPAM 2 MG/ML IJ SOLN
1.0000 mg | Freq: Once | INTRAMUSCULAR | Status: AC
  Administered 2015-10-26: 1 mg via INTRAVENOUS
  Filled 2015-10-26: qty 1

## 2015-10-26 MED ORDER — ENOXAPARIN SODIUM 60 MG/0.6ML ~~LOC~~ SOLN
1.0000 mg/kg | Freq: Two times a day (BID) | SUBCUTANEOUS | Status: DC
  Administered 2015-10-26 – 2015-10-30 (×8): 45 mg via SUBCUTANEOUS
  Filled 2015-10-26 (×9): qty 0.6

## 2015-10-26 NOTE — Care Management Note (Addendum)
Case Management Note  Patient Details  Name: Caroline Lewis MRN: [id number] Date of Birth: February 21, 1924  Subjective/Objective:                    Action/Plan: Rec: GI consult to perform colonoscopy to make sure there are no synchronous lesions. NG tube to LWS to decompress small bowel. PICC and TPN to boost nutritional status (ideally for 5-7 days is possible). Partial colectomy +/- ileostomy after previous things have been done.  Will continue to follow .    Prior to admission patient was active with [facility] for RN ( CHF management ) and PT , will need new orders for same and face to face to resume care.  Expected Discharge Date:                  Expected Discharge Plan:  [place]  In-House Referral:     Discharge planning Services     Post Acute Care Choice:    Choice offered to:     DME Arranged:    DME Agency:     HH Arranged:    [place] Agency:     Status of Service:  In process, will continue to follow  Medicare Important Message Given:    Date Medicare IM Given:    Medicare IM give by:    Date Additional Medicare IM Given:    Additional Medicare Important Message give by:     If discussed at Door of Stay Meetings, dates discussed:    Additional Comments:  Marilu Favre, RN 10/26/2015, 3:45 PM

## 2015-10-26 NOTE — Progress Notes (Signed)
Family Medicine Teaching Service Daily Progress Note Intern Pager: [phone]  Patient name: Caroline Lewis Medical record number: [id number] Date of birth: May 13, 1924 Age: 81 y.o. Gender: female  Primary Care Provider: Zigmund Gottron, MD Consultants: Gen Surg  Code Status: DNR  Pt Overview and Major Events to Date:  11/30: Admitted for SBO   Assessment and Plan: Caroline Lewis is a 81 y.o. female presenting with nausea, vomiting and intermittent sharp abdominal pain. PMH is significant for IBS, RA, constipation, GERD, PVD, postmenopausal bleeding, hiatal hernia, and recent DVT started on coumadin.   Small bowel obstruction: Seen on CT abdomen/pelvis. Also with colonic mass concerning for neoplasm as possible source of inflammation that could have caused obstruction. Patient meets SIRS criteria with tachycardia and leukocytosis to 19.1 but appears nontoxic and has a benign abdominal exam aside from distension. Does not complain of nausea or pain at present, so suspicion of ischemic bowel is low. Has not vomited since admission so no NGT as of now.  - General surgery consult; appreciate recommendations >> obtain pre-albumin and CEA  - NPO. IVFs at 90 ml/hr.  - GI consulted and awaiting recs; daughter and patient wish to proceed with colonoscopy and biopsy of mass  - Tylenol prn for pain.  - Monitor I/Os -d/c coumadin and transition to lovenox from heparin   SIRS: As stated above, patient is tachycardic with leukocytosis. She is afebrile and appears nontoxic with normal lactic acid, so holding off on starting antibiotics. Consider IVF bolus if continues to be tachycardic, though would be cautious with grade 2 diastolic dysfunction on echo 09/22/2015, crackles, and pedal edema (worsened by hypoalbuminemia). - Monitor for fever; has been afebrile since admission  - Repeat CBC in am: WBC 17.5 (12/1)  Chest pain: Low suspicion for ACS given normal EKG, but troponin of 0.04 in ED.  Suspect GERD given patient's history.  - Trending troponins; last two were 0.05  - Will resume home protonix once no longer NPO   Constipation: Chronic, has not had a bowel movement in 2 weeks, per family but small BMs with enemas. - ducolax suppository ordered prn - home ducusate sodium enema ordered prn  Recent DVT: INR 1.4 (12/1)  -Lovenox at treatment dose   Dysphagia: Thought to be due to candidal esophagitis at last admission.  - Ordered bedside SLP consult.   Anemia of chronic disease: Hgb 12.0. MCV 84.9. Improved from 8.0 a month ago when patient had hematemesis. During last admission, anemia panel significant for borderline low folate at 5.9, iron, ferretin and TIBC. - Holding home ferrous sulfate and folic acid as NPO.  - Consider IV iron due to her propensity towards constipation.  RA: Patient has decreased mobility 2/2 RA and gets around at home with an electric scooter.  - Continue home once no longer NPO.  - Patient takes weekly methotrexate 20 mg on Fridays. Will need to order 12/2 if no longer NPO.   LE Edema: Improved with treatment of DVT. HFpEF, with normal EF and grade 2 diastolic dysfunction on ECHO 11/28.  - Continue to monitor.   Protein calorie malnutrition: With excessive muscular atrophy and ongoing weight loss possibly related to neoplasm and/or inanition. Chronic constipation now with SBO likely contributing to nausea and feeling of fullness. Likely need to r/o neoplasm with colonoscopy.  - Patient has lost nearly 7 lbs since last office visit 09/28/15.  - Nutrition consult prior to discharge -will likely need TPN during hospitalization   Cough: Recently treated as  HCAP with levofloxacin. Completed course.  - Patient saturating 92-96% on room air. - No evidence of PNA on CXR.   FEN/GI: NPO for surgery. D51/2NS @ 90 ml/hr while NPO.  Prophylaxis: Lovenox   Disposition: Pending management of SBO and colonic mass   Subjective:  States she is  feeling well this morning. No N/V since admission. Has been tolerating ice chips and drank 3/4 of contrast last night without issue. Is not hungry. Discussed colonic mass with patient and wishes to proceed with colonoscopy with biopsy.   Objective: Temp:  [97.5 F (36.4 C)-98.6 F (37 C)] 98.6 F (37 C) (12/01 0525) Pulse Rate:  [83-116] 103 (12/01 0525) Resp:  [19-28] 20 (12/01 0525) BP: (91-125)/(54-89) 101/64 mmHg (12/01 0525) SpO2:  [92 %-96 %] 92 % (12/01 0525) Weight:  [98 lb 1.7 oz (44.5 kg)-108 lb (48.988 kg)] 98 lb 1.7 oz (44.5 kg) (12/01 0217) Physical Exam: General: cachetic elderly female resting in bed in NAD  Cardiovascular: slightly tachycardic, regular rhythm  Abdomen: distended, no TTP, no masses appreciated, no guarding or rebound tenderness  Extremities: 2+ edema of LLE to knee, 2+ pitting edema to ankles bilaterally, no TTP of L calf and no palpable cords   Laboratory:  Recent Labs Lab 10/24/2015 1925 10/26/15 0740  WBC 19.1* 17.5*  HGB 12.0 10.0*  HCT 39.5 32.9*  PLT 273 220    Recent Labs Lab 10/24/2015 1925  NA 135  K 4.3  CL 101  CO2 23  BUN 12  CREATININE 0.64  CALCIUM 8.3*  PROT 6.5  BILITOT 0.7  ALKPHOS 91  ALT 14  AST 21  GLUCOSE 154*     Imaging/Diagnostic Tests: Dg Chest 2 View  10/24/2015  CLINICAL DATA:  Epigastric pain.  nausea and vomiting. EXAM: CHEST  2 VIEW COMPARISON:  09/20/2015 FINDINGS: Chronic interstitial lung disease again seen with bibasilar predominance. No evidence of acute infiltrate or pleural effusion. Heart size remains at the upper limits of normal and stable. IMPRESSION: Chronic basilar predominant interstitial lung disease. No acute findings. Electronically Signed   By: Earle Gell M.D.   On: 09/26/2015 20:30   Ct Abdomen Pelvis W Contrast  10/04/2015  CLINICAL DATA:  Generalized sharp abdominal pain. Nausea and vomiting. EXAM: CT ABDOMEN AND PELVIS WITH CONTRAST TECHNIQUE: Multidetector CT imaging of the  abdomen and pelvis was performed using the standard protocol following bolus administration of intravenous contrast. CONTRAST:  63mL OMNIPAQUE IOHEXOL 300 MG/ML  SOLN COMPARISON:  07/22/2015 FINDINGS: Lower chest:  Bibasilar fibrosis.  Normal heart size. Hepatobiliary: Normal liver. Mild intrahepatic biliary ductal dilatation. Normal gallbladder. Pancreas: Normal. Spleen: Normal. Adrenals/Urinary Tract: Normal adrenal glands. No solid renal mass. No obstructive uropathy. No urolithiasis. Decompressed bladder. Stomach/Bowel: Moderate-sized hiatal hernia. Severe small bowel dilatation measuring up to 3.7 cm. The small bowel is diffusely dilated. There is dilatation of the ascending colon with an air-fluid level with a relative pointer transition in the mid ascending colon where there is a mass slight soft tissue prominence. No pneumatosis, pneumoperitoneum or portal venous gas. Vascular/Lymphatic: Normal caliber abdominal aorta with atherosclerosis. Other: No fluid collection or hematoma. Musculoskeletal: Right total hip arthroplasty with resulting beam hardening artifact obscuring portions of the pelvis. Three cannulated screws transfixing the femoral neck. No acute osseous abnormality. No lytic or sclerotic osseous lesion. S-shaped scoliosis of the thoracolumbar spine. Severe degenerative disc disease throughout the thoracolumbar spine. Chronic T8, T9, T11, T12 and L1 vertebral body compression fractures. IMPRESSION: 1. High-grade small bowel obstruction with  a transition point in the mid ascending colon. Masslike prominence in the mid ascending colon concerning for neoplasm. Recommend further evaluation with colonoscopy. Electronically Signed   By: Kathreen Devoid   On: 10/07/2015 23:55    Nicolette Bang, DO 10/26/2015, 8:21 AM PGY-1, [place] Intern pager: [phone], text pages welcome

## 2015-10-26 NOTE — Progress Notes (Signed)
Attempted to drop NG tube to right nare. Resistance met. RN attempted using the left nare, slight resistance met initially  but tube advanced with a gentle push. Patient began to panic and pull at tube. Patient stated ", Take it out, I can't do this." RN removed portion the tube that was in. NP paged. Per NP we will see if fluro can place tube.

## 2015-10-26 NOTE — Evaluation (Signed)
Physical Therapy Evaluation Patient Details Name: Caroline Lewis MRN: [id number] DOB: 1924/03/25 Today's Date: 10/26/2015   History of Present Illness  Caroline Lewis is a 81 y.o. female presenting with nausea, vomiting and intermittent sharp abdominal pain. PMH is significant for IBS, RA, constipation, GERD, PVD, postmenopausal bleeding, hiatal hernia, and recent DVT started on coumadin.   Clinical Impression  Pt admitted with above diagnosis. Pt currently with functional limitations due to the deficits listed below (see PT Problem List).  Pt will benefit from skilled PT to increase their independence and safety with mobility to allow discharge to the venue listed below.  Pt transferring with MIN A and with poor activity tolerance.  Pt was currently receiving HHPT and recommend continuing at time of d/c.     Follow Up Recommendations Home health PT    Equipment Recommendations  None recommended by PT    Recommendations for Other Services       Precautions / Restrictions Precautions Precautions: Fall Restrictions Weight Bearing Restrictions: No      Mobility  Bed Mobility Overal bed mobility: Needs Assistance Bed Mobility: Supine to Sit     Supine to sit: Supervision;HOB elevated     General bed mobility comments: Able to get to EOB with S with use of rail.  Transfers Overall transfer level: Needs assistance Equipment used: 1 person hand held assist Transfers: Sit to/from [facility] Sit to Stand: Min assist Stand pivot transfers: Min assist       General transfer comment: Flexed posture and flexed knees with transfer.  HHA for transfer.  Ambulation/Gait                Stairs            Wheelchair Mobility    Modified Rankin (Stroke Patients Only)       Balance Overall balance assessment: Needs assistance   Sitting balance-Leahy Scale: Fair       Standing balance-Leahy Scale: Poor                                Pertinent Vitals/Pain Pain Assessment: No/denies pain    Home Living Family/patient expects to be discharged to:: Private residence Living Arrangements: Children Available Help at Discharge: Family;Available 24 hours/day Type of Home: House Home Access: Level entry     Home Layout: Able to live on main level with bedroom/bathroom Home Equipment: Transport planner;Bedside commode;Walker - 2 wheels;Shower seat;Grab bars - tub/shower;Hospital bed      Prior Function Level of Independence: Independent with assistive device(s)         Comments: Pt reports that she mainly gets around with a motorized scooter in the house and stand pivots--limited ambulation only to one of her bathrooms from the door holding onto things as she ambulates from bathroom door to toilet.  Since last month hospitalization  pt not using bathroom as much and using bed pan more due to weakness and urgency.     Hand Dominance        Extremity/Trunk Assessment   Upper Extremity Assessment: Generalized weakness           Lower Extremity Assessment: Generalized weakness      Cervical / Trunk Assessment: Kyphotic  Communication   Communication: No difficulties  Cognition Arousal/Alertness: Awake/alert Behavior During Therapy: WFL for tasks assessed/performed;Flat affect Overall Cognitive Status: Within Functional Limits for tasks assessed  General Comments General comments (skin integrity, edema, etc.): Pt appears fatigued with minimal activity.    Exercises General Exercises - Lower Extremity Ankle Circles/Pumps: AROM;10 reps;Seated      Assessment/Plan    PT Assessment Patient needs continued PT services  PT Diagnosis Generalized weakness   PT Problem List Decreased strength;Decreased activity tolerance;Decreased balance;Decreased mobility  PT Treatment Interventions Gait training;Functional mobility training;Therapeutic activities;Therapeutic  exercise;Balance training   PT Goals (Current goals can be found in the Care Plan section) Acute Rehab PT Goals Patient Stated Goal: Pt agreeable to get up to recliner PT Goal Formulation: With patient/family Time For Goal Achievement: 11/02/15 Potential to Achieve Goals: Good    Frequency Min 3X/week   Barriers to discharge        Co-evaluation               End of Session Equipment Utilized During Treatment: Gait belt Activity Tolerance: Patient limited by fatigue Patient left: in chair;with call bell/phone within reach;with family/visitor present           Time: [id number] PT Time Calculation (min) (ACUTE ONLY): 20 min   Charges:   PT Evaluation $Initial PT Evaluation Tier I: 1 Procedure     PT G Codes:        Caroline Lewis 10/26/2015, 10:31 AM

## 2015-10-26 NOTE — Consult Note (Signed)
   [facility] Inpatient Consult   10/26/2015  Caroline Lewis 1923/12/24 [id number] Received a call and spoke with [place] nurse, Edwena Felty regarding patient's potential barriers to care.  Patient is currently active with [facility] for chronic disease management services.  Patient has been engaged by a [employer].  Our community based plan of care has focused on disease management and community resource support.  Patient will receive a post discharge transition of care call and will be evaluated for monthly home visits for assessments and disease process education.  Made Inpatient Case Manager aware that [facility] following. Of note, [facility] Care Management services does not replace or interfere with any services that are needed or arranged by inpatient case management or social work.  For additional questions or referrals please contact: Natividad Brood, RN BSN [facility] Liaison  [phone] business mobile phone

## 2015-10-26 NOTE — Progress Notes (Addendum)
PARENTERAL NUTRITION CONSULT NOTE - INITIAL  Pharmacy Consult for TPN Indication: Small bowel obstruction  Allergies  Allergen Reactions  . Fosamax [Alendronate Sodium] Other (See Comments)    It affected my IBS    Patient Measurements: Height: 5' (152.4 cm) Weight: 98 lb 1.7 oz (44.5 kg) IBW/kg (Calculated) : 45.5 Adjusted Body Weight:  Usual Weight:   Vital Signs: Temp: 98.6 F (37 C) (12/01 0525) Temp Source: Oral (12/01 0525) BP: 101/64 mmHg (12/01 0525) Pulse Rate: 103 (12/01 0525) Intake/Output from previous day: 11/30 0701 - 12/01 0700 In: 1163 [P.O.:480; I.V.:683] Out: 200 [Urine:200] Intake/Output from this shift:    Labs:  Recent Labs  10/02/2015 1925 10/26/15 0740  WBC 19.1* 17.5*  HGB 12.0 10.0*  HCT 39.5 32.9*  PLT 273 220  INR  --  1.41     Recent Labs  09/27/2015 1925 10/26/15 0740  NA 135 135  K 4.3 3.8  CL 101 104  CO2 23 22  GLUCOSE 154* 115*  BUN 12 11  CREATININE 0.64 0.48  CALCIUM 8.3* 7.5*  PROT 6.5  --   ALBUMIN 2.7*  --   AST 21  --   ALT 14  --   ALKPHOS 91  --   BILITOT 0.7  --    Estimated Creatinine Clearance: 33.5 mL/min (by C-G formula based on Cr of 0.48).   No results for input(s): GLUCAP in the last 72 hours.  Medical History: Past Medical History  Diagnosis Date  . Rheumatoid arthritis(714.0)   . IBS (irritable bowel syndrome)   . Constipation   . Allergy     seasonal  . Osteoporosis   . Sciatica   . Peripheral vascular disease ([place])     "beyond help"  . Pneumonia 05/25/12; 2010    Medications:  Prescriptions prior to admission  Medication Sig Dispense Refill Last Dose  . docusate sodium (ENEMEEZ) 283 MG enema Place 1 enema (283 mg total) rectally daily. (Patient taking differently: Place 1 enema rectally daily as needed for moderate constipation. ) 10 each 0 10/24/2015 at Unknown time  . fesoterodine (TOVIAZ) 8 MG TB24 tablet Take 1 tablet (8 mg total) by mouth daily. 90 tablet 3 10/24/2015 at Unknown  time  . folic acid (FOLVITE) 1 MG tablet Take 1 mg by mouth daily.   10/24/2015 at Unknown time  . methotrexate (RHEUMATREX) 2.5 MG tablet Take 20 mg by mouth once a week. Caution:Chemotherapy. Protect from light. Take on Fridays   Past Week at Unknown time  . omeprazole (PRILOSEC) 40 MG capsule Take 1 capsule (40 mg total) by mouth daily. 90 capsule 3 10/24/2015 at Unknown time  . sennosides-docusate sodium (SENOKOT-S) 8.6-50 MG tablet Take 1 tablet by mouth daily as needed for constipation.   10/24/2015 at Unknown time  . warfarin (COUMADIN) 5 MG tablet Take 1 tablet (5 mg total) by mouth one time only at 6 PM. (Patient taking differently: Take 2.5 mg by mouth daily at 6 PM. HOLD DOSE ON Monday'S EACH WEEK-UNTIL NEXT INR CHECK) 30 tablet 0 10/24/2015 at Unknown time  . ferrous sulfate 325 (65 FE) MG tablet Take 1 tablet (325 mg total) by mouth 3 (three) times daily with meals.  3 Taking  . HYDROcodone-acetaminophen (NORCO) 10-325 MG tablet Take 1 tablet by mouth every 8 (eight) hours as needed for moderate pain. (Patient not taking: Reported on 09/20/2015) 270 tablet 0 Not Taking  . magnesium citrate SOLN Take 148 mLs (0.5 Bottles total) by mouth once. (  Patient not taking: Reported on 09/20/2015) 195 mL 1 Not Taking  . ondansetron (ZOFRAN) 4 MG tablet Take 1 tablet (4 mg total) by mouth every 8 (eight) hours as needed for nausea or vomiting. (Patient not taking: Reported on 10/11/2015) 30 tablet 0 Not Taking  . Simethicone 80 MG TABS Take 1 tablet (80 mg total) by mouth 4 (four) times daily -  before meals and at bedtime. (Patient not taking: Reported on 09/20/2015) 60 tablet 0 Not Taking  . [DISCONTINUED] enoxaparin (LOVENOX) 40 MG/0.4ML injection Inject 0.45 mLs (45 mg total) into the skin daily. (Patient not taking: Reported on 10/05/2015) 0.9 mL 0 Not Taking  . [DISCONTINUED] levofloxacin (LEVAQUIN) 250 MG tablet Take 1 tablet (250 mg total) by mouth daily. (Patient not taking: Reported on  10/11/2015) 5 tablet 0 Not Taking  . [DISCONTINUED] nystatin (MYCOSTATIN) 100000 UNIT/ML suspension Take 5 mLs (500,000 Units total) by mouth 4 (four) times daily. (Patient not taking: Reported on 10/17/2015) 220 mL 0 Not Taking    Insulin Requirements in the past 24 hours:  None  Current Nutrition:  Reports good appetite PTA, but unable to eat much food  D5-1/2NS @ 90 ml/hr  Assessment: 81 year old female c/o N/V, constipation, sharp abdominal pain. States she has not had BM in ~ 2 weeks, has had 7 lb weight loss in one month. Surgery was consulted, CT shows a nearly completely obstructed R colonic mass. Surgeries/Procedures: Pt is to have colonoscopy and partial colectomy GI: Albumin 2.7, recent weight loss, see assessment above, no BM in 2 weeks Endo: glucose wnl, no hx Lytes: lytes wnl  Renal: SCr 0.5 stable, D5-1/2NS Pulm: RA Cards: VSS Hepatobil: LFTs/Tbili wnl Neuro: A&O AC/Heme: On warfarin PTA for DVT in Oct 2016, INR subtherapeutic on admission, now on Lovenox ID:  WBC 19.2 > 17.5, LA wnl, afebrile, no abx Best Practices: Lovenox TPN Access: PICC to be placed today TPN start date: 12/1 >   Nutritional Goals:  ~1350 kcal 70-90 g AA  Plan:  -Begin Clinimix E 5/15 @ 25 ml/hr and 20% IVFA @ 10 ml/hr -Add MVI and TE to TPN -Monitor lytes and glucose -F/u RD assessment    Idus Rathke, Jake Church 10/26/2015,10:13 AM

## 2015-10-26 NOTE — Care Management (Signed)
Initial UR completed .  

## 2015-10-26 NOTE — Consult Note (Signed)
Reason for Consult: right colon mass with obstruction  Referring Physician: Dr. Dorcas Mcmurray    HPI: Caroline Lewis is a 81 year old female with a history of IBS, RA on methotrexate, DVT(diagnosed 10/16) on coumadin presented with abdominal pain, nausea and vomiting.  Most history was obtained by her daughter, Caroline Lewis.  Caroline Lewis reports ongoing issues with constipation having bowel movements every 2 weeks at times.  Apparently 2 days ago, home health administered an enema and manually disimpacted her with little results.  She is passing flatus, but has not had a bowel movement since then.  This has been ongoing for many years now.  She reports intermittent nausea and vomiting, early satiety.  At times, emesis would be feculent.  Reports a good appetite, but the inability to eat much food.  She has had about 7 pound weight loss in about 1 month.   Last colonoscopy was approximately 20 years ago.    CT of A/P shows a small bowel obstruction with a transition point in the mid ascending colon, probably a mass. We have therefore been asked asked to evaluate.  Denies previous abdominal surgeries Denies any cardiac issues Lives at home with her son and is fairly independent  Past smoker, exposure to second hand smoke for many years No past history of carcinoma  Past Medical History  Diagnosis Date  . Rheumatoid arthritis(714.0)   . IBS (irritable bowel syndrome)   . Constipation   . Allergy     seasonal  . Osteoporosis   . Sciatica   . Peripheral vascular disease ([place])     "beyond help"  . Pneumonia 05/25/12; 2010    Past Surgical History  Procedure Laterality Date  . Replacement total knee  ~ 2010    left  . Facial cosmetic surgery      left cheek  . Rotator cuff repair      right  . Bunionectomy  1990's    bilateral  . Joint replacement    . Partial hip arthroplasty  05/26/12    right  . Cataract extraction w/ intraocular lens  implant, bilateral  ~ 2003  . Eye surgery  ~ 2003    right;  "precancerous; took out section in lower part of eye"  . Hip arthroplasty  05/26/2012    Procedure: ARTHROPLASTY BIPOLAR HIP;  Surgeon: Hessie Dibble, MD;  Location: [facility];  Service: Orthopedics;  Laterality: Right;  . Esophagogastroduodenoscopy N/A 09/21/2015    Procedure: ESOPHAGOGASTRODUODENOSCOPY (EGD);  Surgeon: Clarene Essex, MD;  Location: [facility] ENDOSCOPY;  Service: Endoscopy;  Laterality: N/A;    Family History  Problem Relation Age of Onset  . Lung cancer Sister   . Uterine cancer Sister   . Colon cancer Neg Hx   . Heart disease Father   . GI problems Son   . Irritable bowel syndrome Son     Social History:  reports that she quit smoking about 27 years ago. Her smoking use included Cigarettes. She has a 5 pack-year smoking history. She has never used smokeless tobacco. She reports that she does not drink alcohol or use illicit drugs.  Allergies:  Allergies  Allergen Reactions  . Fosamax [Alendronate Sodium] Other (See Comments)    It affected my IBS    Medications:  No current facility-administered medications on file prior to encounter.   Current Outpatient Prescriptions on File Prior to Encounter  Medication Sig Dispense Refill  . docusate sodium (ENEMEEZ) 283 MG enema Place 1 enema (283 mg total)  rectally daily. (Patient taking differently: Place 1 enema rectally daily as needed for moderate constipation. ) 10 each 0  . fesoterodine (TOVIAZ) 8 MG TB24 tablet Take 1 tablet (8 mg total) by mouth daily. 90 tablet 3  . folic acid (FOLVITE) 1 MG tablet Take 1 mg by mouth daily.    . methotrexate (RHEUMATREX) 2.5 MG tablet Take 20 mg by mouth once a week. Caution:Chemotherapy. Protect from light. Take on Fridays    . omeprazole (PRILOSEC) 40 MG capsule Take 1 capsule (40 mg total) by mouth daily. 90 capsule 3  . warfarin (COUMADIN) 5 MG tablet Take 1 tablet (5 mg total) by mouth one time only at 6 PM. (Patient taking differently: Take 2.5 mg by mouth daily at 6 PM. HOLD DOSE ON  Monday'S EACH WEEK-UNTIL NEXT INR CHECK) 30 tablet 0  . ferrous sulfate 325 (65 FE) MG tablet Take 1 tablet (325 mg total) by mouth 3 (three) times daily with meals.  3  . HYDROcodone-acetaminophen (NORCO) 10-325 MG tablet Take 1 tablet by mouth every 8 (eight) hours as needed for moderate pain. (Patient not taking: Reported on 09/20/2015) 270 tablet 0  . magnesium citrate SOLN Take 148 mLs (0.5 Bottles total) by mouth once. (Patient not taking: Reported on 09/20/2015) 195 mL 1  . ondansetron (ZOFRAN) 4 MG tablet Take 1 tablet (4 mg total) by mouth every 8 (eight) hours as needed for nausea or vomiting. (Patient not taking: Reported on 10/11/2015) 30 tablet 0  . Simethicone 80 MG TABS Take 1 tablet (80 mg total) by mouth 4 (four) times daily -  before meals and at bedtime. (Patient not taking: Reported on 09/20/2015) 60 tablet 0     Results for orders placed or performed during the hospital encounter of 10/08/2015 (from the past 48 hour(s))  Lipase, blood     Status: None   Collection Time: 10/17/2015  7:25 PM  Result Value Ref Range   Lipase 24 11 - 51 U/L  Comprehensive metabolic panel     Status: Abnormal   Collection Time: 10/03/2015  7:25 PM  Result Value Ref Range   Sodium 135 135 - 145 mmol/L   Potassium 4.3 3.5 - 5.1 mmol/L   Chloride 101 101 - 111 mmol/L   CO2 23 22 - 32 mmol/L   Glucose, Bld 154 (H) 65 - 99 mg/dL   BUN 12 6 - 20 mg/dL   Creatinine, Ser 0.64 0.44 - 1.00 mg/dL   Calcium 8.3 (L) 8.9 - 10.3 mg/dL   Total Protein 6.5 6.5 - 8.1 g/dL   Albumin 2.7 (L) 3.5 - 5.0 g/dL   AST 21 15 - 41 U/L   ALT 14 14 - 54 U/L   Alkaline Phosphatase 91 38 - 126 U/L   Total Bilirubin 0.7 0.3 - 1.2 mg/dL   GFR calc non Af Amer >60 >60 mL/min   GFR calc Af Amer >60 >60 mL/min    Comment: (NOTE) The eGFR has been calculated using the CKD EPI equation. This calculation has not been validated in all clinical situations. eGFR's persistently <60 mL/min signify possible Chronic  Kidney Disease.    Anion gap 11 5 - 15  CBC     Status: Abnormal   Collection Time: 09/26/2015  7:25 PM  Result Value Ref Range   WBC 19.1 (H) 4.0 - 10.5 K/uL   RBC 4.65 3.87 - 5.11 MIL/uL   Hemoglobin 12.0 12.0 - 15.0 g/dL   HCT 39.5 36.0 - 46.0 %  MCV 84.9 78.0 - 100.0 fL   MCH 25.8 (L) 26.0 - 34.0 pg   MCHC 30.4 30.0 - 36.0 g/dL   RDW 26.9 (H) 11.5 - 15.5 %   Platelets 273 150 - 400 K/uL  I-Stat CG4 Lactic Acid, ED     Status: None   Collection Time: 10/16/2015  7:41 PM  Result Value Ref Range   Lactic Acid, Venous 1.69 0.5 - 2.0 mmol/L  Troponin I     Status: Abnormal   Collection Time: 10/07/2015 10:03 PM  Result Value Ref Range   Troponin I 0.04 (H) <0.031 ng/mL    Comment:        PERSISTENTLY INCREASED TROPONIN VALUES IN THE RANGE OF 0.04-0.49 ng/mL CAN BE SEEN IN:       -UNSTABLE ANGINA       -CONGESTIVE HEART FAILURE       -MYOCARDITIS       -CHEST TRAUMA       -ARRYHTHMIAS       -LATE PRESENTING MYOCARDIAL INFARCTION       -COPD   CLINICAL FOLLOW-UP RECOMMENDED.   I-Stat CG4 Lactic Acid, ED     Status: None   Collection Time: 10/05/2015 11:52 PM  Result Value Ref Range   Lactic Acid, Venous 0.79 0.5 - 2.0 mmol/L  Troponin I     Status: Abnormal   Collection Time: 10/26/15  4:02 AM  Result Value Ref Range   Troponin I 0.05 (H) <0.031 ng/mL    Comment:        PERSISTENTLY INCREASED TROPONIN VALUES IN THE RANGE OF 0.04-0.49 ng/mL CAN BE SEEN IN:       -UNSTABLE ANGINA       -CONGESTIVE HEART FAILURE       -MYOCARDITIS       -CHEST TRAUMA       -ARRYHTHMIAS       -LATE PRESENTING MYOCARDIAL INFARCTION       -COPD   CLINICAL FOLLOW-UP RECOMMENDED.   CBC     Status: Abnormal   Collection Time: 10/26/15  7:40 AM  Result Value Ref Range   WBC 17.5 (H) 4.0 - 10.5 K/uL   RBC 3.91 3.87 - 5.11 MIL/uL   Hemoglobin 10.0 (L) 12.0 - 15.0 g/dL   HCT 32.9 (L) 36.0 - 46.0 %   MCV 84.1 78.0 - 100.0 fL   MCH 25.6 (L) 26.0 - 34.0 pg   MCHC 30.4 30.0 - 36.0 g/dL   RDW  26.5 (H) 11.5 - 15.5 %   Platelets 220 150 - 400 K/uL  Protime-INR     Status: Abnormal   Collection Time: 10/26/15  7:40 AM  Result Value Ref Range   Prothrombin Time 17.4 (H) 11.6 - 15.2 seconds   INR 1.41 0.00 - 1.49    Dg Chest 2 View  10/02/2015  CLINICAL DATA:  Epigastric pain.  nausea and vomiting. EXAM: CHEST  2 VIEW COMPARISON:  09/20/2015 FINDINGS: Chronic interstitial lung disease again seen with bibasilar predominance. No evidence of acute infiltrate or pleural effusion. Heart size remains at the upper limits of normal and stable. IMPRESSION: Chronic basilar predominant interstitial lung disease. No acute findings. Electronically Signed   By: Earle Gell M.D.   On: 09/29/2015 20:30   Ct Abdomen Pelvis W Contrast  09/27/2015  CLINICAL DATA:  Generalized sharp abdominal pain. Nausea and vomiting. EXAM: CT ABDOMEN AND PELVIS WITH CONTRAST TECHNIQUE: Multidetector CT imaging of the abdomen and pelvis was performed using the standard  protocol following bolus administration of intravenous contrast. CONTRAST:  30mL OMNIPAQUE IOHEXOL 300 MG/ML  SOLN COMPARISON:  07/22/2015 FINDINGS: Lower chest:  Bibasilar fibrosis.  Normal heart size. Hepatobiliary: Normal liver. Mild intrahepatic biliary ductal dilatation. Normal gallbladder. Pancreas: Normal. Spleen: Normal. Adrenals/Urinary Tract: Normal adrenal glands. No solid renal mass. No obstructive uropathy. No urolithiasis. Decompressed bladder. Stomach/Bowel: Moderate-sized hiatal hernia. Severe small bowel dilatation measuring up to 3.7 cm. The small bowel is diffusely dilated. There is dilatation of the ascending colon with an air-fluid level with a relative pointer transition in the mid ascending colon where there is a mass slight soft tissue prominence. No pneumatosis, pneumoperitoneum or portal venous gas. Vascular/Lymphatic: Normal caliber abdominal aorta with atherosclerosis. Other: No fluid collection or hematoma. Musculoskeletal: Right total  hip arthroplasty with resulting beam hardening artifact obscuring portions of the pelvis. Three cannulated screws transfixing the femoral neck. No acute osseous abnormality. No lytic or sclerotic osseous lesion. S-shaped scoliosis of the thoracolumbar spine. Severe degenerative disc disease throughout the thoracolumbar spine. Chronic T8, T9, T11, T12 and L1 vertebral body compression fractures. IMPRESSION: 1. High-grade small bowel obstruction with a transition point in the mid ascending colon. Masslike prominence in the mid ascending colon concerning for neoplasm. Recommend further evaluation with colonoscopy. Electronically Signed   By: Elige Ko   On: 09/28/2015 23:55    Review of Systems  Constitutional: Positive for weight loss. Negative for fever, chills, malaise/fatigue and diaphoresis.  Respiratory: Negative for cough, hemoptysis, sputum production, shortness of breath and wheezing.   Cardiovascular: Negative for chest pain, palpitations, orthopnea, claudication, leg swelling and PND.  Gastrointestinal: Positive for nausea, vomiting, abdominal pain and constipation. Negative for heartburn, diarrhea, blood in stool and melena.  Genitourinary: Negative for dysuria, urgency, frequency, hematuria and flank pain.  Musculoskeletal: Negative for myalgias, back pain and joint pain.  Neurological: Negative for dizziness, tingling, tremors, loss of consciousness and weakness.  Psychiatric/Behavioral: Negative for depression.   Blood pressure 101/64, pulse 103, temperature 98.6 F (37 C), temperature source Oral, resp. rate 20, height 5' (1.524 m), weight 44.5 kg (98 lb 1.7 oz), SpO2 92 %. Physical Exam  Constitutional: She is oriented to person, place, and time. She appears well-developed and well-nourished. No distress.  Cardiovascular: Normal rate, regular rhythm, normal heart sounds and intact distal pulses.  Exam reveals no gallop and no friction rub.   No murmur heard. Respiratory: Effort  normal and breath sounds normal. No respiratory distress. She has no wheezes. She has no rales. She exhibits no tenderness.  GI: Bowel sounds are normal. She exhibits distension. She exhibits no mass. There is no tenderness. There is no rebound and no guarding.  Musculoskeletal: Normal range of motion. She exhibits no edema or tenderness.  Neurological: She is alert and oriented to person, place, and time.  Skin: Skin is warm and dry. She is not diaphoretic.  Psychiatric: She has a normal mood and affect. Her behavior is normal. Judgment and thought content normal.    Assessment/Plan: Small bowel obstruction 2/2 right colon mass, probable neoplasm  -obtain CEA and pre-albumin, suspect she will need TPN  -consult to GI for a colonoscopy for tissue diagnosis, not sure how well she will be able to tolerate a prep -would benefit from NGT decompression given the amount of small bowel distention, however, she does not have n/v, so will hold off until Dr. Derrell Lolling evaluates. DVT(dx 10/16)-please hold her coumadin.  INR is 1.4 today. May do lovenox/heparin gtt from our standpoint  Erby Pian ANP-BC Pager [phone] 10/26/2015, 8:39 AM

## 2015-10-26 NOTE — Progress Notes (Signed)
SLP Cancellation Note  Patient Details Name: Caroline Lewis MRN: [id number] DOB: 09-23-1924   Cancelled treatment:       Reason Eval/Treat Not Completed: Medical issues which prohibited therapy.  Spoke with surgery who reports that patient is to remain NPO due to GI issues. Verbal orders provided to d/c order. Will re consult when needed.   [place], CCC-SLP [phone]     [name] 10/26/2015, 9:20 AM

## 2015-10-26 NOTE — H&P (Signed)
[facility] Admission History and Physical Service Pager: [phone]  Patient name: Caroline Lewis Medical record number: [id number] Date of birth: 12-29-1923 Age: 81 y.o. Gender: female  Primary Care Provider: Zigmund Gottron, MD Consultants: General Surgery Code Status: DNR  Chief Complaint: n/v, abdominal pain  Assessment and Plan: Caroline Lewis is a 81 y.o. female presenting with nausea, vomiting and intermittent sharp abdominal pain. PMH is significant for IBS, RA, constipation, GERD, PVD, postmenopausal bleeding, hiatal hernia, and recent DVT started on coumadin.   Small bowel obstruction: Seen on CT abdomen/pelvis. Also with colonic mass concerning for neoplasm as possible source of inflammation that could have caused obstruction. Patient meets SIRS criteria with tachycardia and leukocytosis to 19.1 but appears nontoxic and has a benign abdominal exam aside from distension. Does not complain of nausea or pain at present, so suspicion of ischemic bowel is low. Has not vomited since admission so no NGT. Preference for conservative management as able, given patient's age and currently mild symptoms.  - Patient admitted to [facility] service for med-surg bed under attending Dr. Nori Riis.  - General surgery was made aware of patient by ED provider, will consult; appreciate recommendations.  - NPO. IVFs at 90 ml/hr.  - Consider need for inpatient colonoscopy. - Monitor for fevers. Obtain blood cultures if pt spikes fever.  - If vomiting becomes persistent or abdominal pain increases, consider dropping NG tube for decompression.  - Tylenol prn for pain.  - Monitor I/Os  SIRS: As stated above, patient is tachycardic with leukocytosis. She is afebrile and appears nontoxic with normal lactic acid, so holding off on starting antibiotics. Consider IVF bolus if continues to be tachycardic, though would be cautious with grade 2 diastolic dysfunction on echo  09/22/2015, crackles, and pedal edema (worsened by hypoalbuminemia). - Monitor for fever - Repeat CBC in am  Chest pain: Low suspicion for ACS given normal EKG, but troponin of 0.04 in ED. Suspect GERD given patient's history.  - Trending troponins.  - Will resume home protonix once no longer NPO.   Constipation: Chronic, has not had a bowel movement in 2 weeks, per family but small BMs with enemas. - ducolax suppository ordered prn - home ducusate sodium enema ordered prn  Recent DVT: Last INR was 1.9 on 10/05/15.  - Obtain INR (goal 2-3) - Coumadin ordered per pharmacy.  Dysphagia: Thought to be due to candidal esophagitis at last admission.  - Ordered bedside SLP consult.   Anemia of chronic disease: Hgb 12.0. MCV 84.9. Improved from 8.0 a month ago when patient had hematemesis. During last admission, anemia panel significant for borderline low folate at 5.9, iron, ferretin and TIBC. - Holding home ferrous sulfate and folic acid as NPO.  - Consider IV iron due to her propensity towards constipation.  RA: Patient has decreased mobility 2/2 RA and gets around at home with an electric scooter.  - Continue home once no longer NPO.  - Patient takes weekly methotrexate 20 mg on Fridays. Will need to order 12/2 if no longer NPO.   LE Edema: Improved with treatment of DVT. HFpEF, with normal EF and grade 2 diastolic dysfunction on ECHO 11/28.  - Continue to monitor.   Protein calorie malnutrition: With excessive muscular atrophy and ongoing weight loss possibly related to neoplasm and/or inanition. Chronic constipation now with SBO likely contributing to nausea and feeling of fullness. Likely need to r/o neoplasm with colonoscopy.  - Patient has lost nearly 7 lbs since  last office visit 09/28/15.  - Nutrition consult prior to discharge  Cough: Recently treated as HCAP with levofloxacin. Completed course.  - Patient saturating 92-96% on room air. - No evidence of PNA on CXR.   FEN/GI:  NPO for surgery. D51/2NS @ 90 ml/hr while NPO.  Prophylaxis: SQ heparin  Disposition: Admitted to med-surg under attending Dr. Nori Riis. NPO while possibly a surgical candidate.   History of Present Illness:  Caroline Lewis is a 81 y.o. female presenting with nausea and vomiting x 2 with intermittent sharp abdominal pain. Daughters are present at bedside and express longstanding concern over mother's history of constipation and nausea. Per patient and daughters, she has not had a bowel movement in 2 weeks. However, she does use enemas at home and has had a couple small BMs. They report noticing decreased appetite and weight loss over the last 2-3 weeks, eating at most 1 real meal a day. Nausea has worsened over this time. Constipation has been an issue for at least a year but has worsened recently. Regarding food, patient says she "can't hold it." Daughters endorse occasional emesis with feculent odor, also bring up concern about trouble swallowing and occasional cough. Patient also is complaining of some pain in her chest.   Per family, she has come to the hospital a few times due to fecal impaction, but they could not provide a timeline. She was most recently hospitalized for hematemesis and DVT of left leg. During that admission, she had an EGD on 10/27 and was found to have a moderate sized hiatal hernia, severe ulcerative linear esophagitis, atrophic gastritis, and possible minimal proximal candida esophagitis. Patient denies any abdominal surgeries.   In the ED, labs showed lactic acid 1.69, troponin 0.04, CBC with WBC of 19.1 and hgb 12.0. CT abdomen pelvis showed high grade SBO with transition point in mid ascending colon. Masslike prominence in mid ascending colon concerning for neoplasm. CXR showed chronic basilar predominant interstitial lung disease. EKG showed sinus tachycardia, right BBB, and right axis deviation, unchanged from previous tracings.   Review Of Systems: Per HPI with the  following additions: No fevers/chills. Denies dysuria but has history of urinary frequency. Otherwise the remainder of the systems were negative.  Patient Active Problem List   Diagnosis Date Noted  . Small bowel obstruction ([place]) 10/26/2015  . Pneumonia 10/05/2015  . Diastolic congestive heart failure ([place]) 10/01/2015  . Frailty   . Folate deficiency anemia   . Pulmonary hypertension ([place]) 09/22/2015  . Severe muscle deconditioning   . Protein-calorie malnutrition, severe ([place])   . Hematemesis with nausea   . Acute blood loss anemia   . Leg swelling   . Postmenopausal bleeding   . DVT (deep venous thrombosis), left 09/20/2015  . Dark emesis 09/19/2015  . Post-menopausal bleeding 09/19/2015  . Bilateral lower extremity edema 09/19/2015  . Dysphagia 03/24/2015  . Encounter for chronic pain management 11/11/2014  . Seborrheic keratoses 09/29/2014  . Protein-calorie malnutrition ([place]) 07/01/2014  . Irritable bowel syndrome 07/01/2014  . Osteoarthritis of right knee 07/01/2014  . Gastroesophageal reflux disease 04/08/2014  . At high risk for falls 04/08/2014  . Fracture of femoral neck, left, closed 02/19/2013  . Low back pain 09/11/2012  . Actinic keratitis 08/13/2012  . Screening cholesterol level 07/10/2012  . Anemia of chronic disease 05/30/2012  . S/P hip replacement 05/25/2012  . Osteoporosis 05/25/2012  . Urge incontinence 10/11/2011  . Rheumatoid arthritis ([place]) 05/31/2011  . Constipation 03/06/2011    Past  Medical History: Past Medical History  Diagnosis Date  . Rheumatoid arthritis(714.0)   . IBS (irritable bowel syndrome)   . Constipation   . Allergy     seasonal  . Osteoporosis   . Sciatica   . Peripheral vascular disease ([place])     "beyond help"  . Pneumonia 05/25/12; 2010    Past Surgical History: Past Surgical History  Procedure Laterality Date  . Replacement total knee  ~ 2010    left  . Facial cosmetic surgery      left cheek  . Rotator cuff  repair      right  . Bunionectomy  1990's    bilateral  . Joint replacement    . Partial hip arthroplasty  05/26/12    right  . Cataract extraction w/ intraocular lens  implant, bilateral  ~ 2003  . Eye surgery  ~ 2003    right; "precancerous; took out section in lower part of eye"  . Hip arthroplasty  05/26/2012    Procedure: ARTHROPLASTY BIPOLAR HIP;  Surgeon: Hessie Dibble, MD;  Location: [facility];  Service: Orthopedics;  Laterality: Right;  . Esophagogastroduodenoscopy N/A 09/21/2015    Procedure: ESOPHAGOGASTRODUODENOSCOPY (EGD);  Surgeon: Clarene Essex, MD;  Location: [facility] ENDOSCOPY;  Service: Endoscopy;  Laterality: N/A;    Social History: Social History  Substance Use Topics  . Smoking status: Former Smoker -- 0.10 packs/day for 50 years    Types: Cigarettes    Quit date: 11/26/1987  . Smokeless tobacco: Never Used  . Alcohol Use: No   Additional social history: Lives with son, with daughters who check in frequently. Does not currently drink alcohol for fear it will hurt her stomach. 5 pack-year-history of smoking also with secondhand smoke exposure from husband.  Please also refer to relevant sections of EMR.  Family History: Family History  Problem Relation Age of Onset  . Lung cancer Sister   . Uterine cancer Sister   . Colon cancer Neg Hx   . Heart disease Father   . GI problems Son   . Irritable bowel syndrome Son     Allergies and Medications: Allergies  Allergen Reactions  . Fosamax [Alendronate Sodium] Other (See Comments)    It affected my IBS   No current facility-administered medications on file prior to encounter.   Current Outpatient Prescriptions on File Prior to Encounter  Medication Sig Dispense Refill  . docusate sodium (ENEMEEZ) 283 MG enema Place 1 enema (283 mg total) rectally daily. (Patient taking differently: Place 1 enema rectally daily as needed for moderate constipation. ) 10 each 0  . fesoterodine (TOVIAZ) 8 MG TB24 tablet Take 1 tablet (8  mg total) by mouth daily. 90 tablet 3  . folic acid (FOLVITE) 1 MG tablet Take 1 mg by mouth daily.    . methotrexate (RHEUMATREX) 2.5 MG tablet Take 20 mg by mouth once a week. Caution:Chemotherapy. Protect from light. Take on Fridays    . omeprazole (PRILOSEC) 40 MG capsule Take 1 capsule (40 mg total) by mouth daily. 90 capsule 3  . warfarin (COUMADIN) 5 MG tablet Take 1 tablet (5 mg total) by mouth one time only at 6 PM. (Patient taking differently: Take 2.5 mg by mouth daily at 6 PM. HOLD DOSE ON Monday'S EACH WEEK-UNTIL NEXT INR CHECK) 30 tablet 0  . enoxaparin (LOVENOX) 40 MG/0.4ML injection Inject 0.45 mLs (45 mg total) into the skin daily. (Patient not taking: Reported on 10/05/2015) 0.9 mL 0  . ferrous sulfate 325 (  65 FE) MG tablet Take 1 tablet (325 mg total) by mouth 3 (three) times daily with meals.  3  . HYDROcodone-acetaminophen (NORCO) 10-325 MG tablet Take 1 tablet by mouth every 8 (eight) hours as needed for moderate pain. (Patient not taking: Reported on 09/20/2015) 270 tablet 0  . levofloxacin (LEVAQUIN) 250 MG tablet Take 1 tablet (250 mg total) by mouth daily. (Patient not taking: Reported on 10/11/2015) 5 tablet 0  . magnesium citrate SOLN Take 148 mLs (0.5 Bottles total) by mouth once. (Patient not taking: Reported on 09/20/2015) 195 mL 1  . nystatin (MYCOSTATIN) 100000 UNIT/ML suspension Take 5 mLs (500,000 Units total) by mouth 4 (four) times daily. (Patient not taking: Reported on 10/17/2015) 220 mL 0  . ondansetron (ZOFRAN) 4 MG tablet Take 1 tablet (4 mg total) by mouth every 8 (eight) hours as needed for nausea or vomiting. (Patient not taking: Reported on 10/11/2015) 30 tablet 0  . Simethicone 80 MG TABS Take 1 tablet (80 mg total) by mouth 4 (four) times daily -  before meals and at bedtime. (Patient not taking: Reported on 09/20/2015) 60 tablet 0    Objective: BP 107/71 mmHg  Pulse 102  Temp(Src) 98.3 F (36.8 C) (Oral)  Resp 20  Ht 5' (1.524 m)  Wt 108 lb  (48.988 kg)  BMI 21.09 kg/m2  SpO2 94% Exam: General: Cachectic elderly woman, resting in bed in NAD HEENT: NCAT, EOMI Cardiovascular: Slightly tachycardic, regular, S1, S2, no m/r/g Respiratory: Mild crackles throughout, decreased air movement without instruction to inhale deeply but adequate flow when prompted Abdomen: Distended, tympanitic, no TTP, no guarding or rebound tenderness Skin: Good skin turgor, no tenting. Symmetric 3+ pitting edema of plantar surfaces of feet and 2+ pitting edema of ankles bilaterally.  MSK: Kyphosis and scoliosis Neuro: AOx3. No focal deficits.  Psych: Cooperative, pleasant. Normal mood. Oriented but requires interjections from daughters for facts.  Labs and Imaging: CBC BMET   Recent Labs Lab 10/24/2015 1925  WBC 19.1*  HGB 12.0  HCT 39.5  PLT 273    Recent Labs Lab 10/23/2015 1925  NA 135  K 4.3  CL 101  CO2 23  BUN 12  CREATININE 0.64  GLUCOSE 154*  CALCIUM 8.3*     Dg Chest 2 View  10/02/2015  CLINICAL DATA:  Epigastric pain.  nausea and vomiting. EXAM: CHEST  2 VIEW COMPARISON:  09/20/2015 FINDINGS: Chronic interstitial lung disease again seen with bibasilar predominance. No evidence of acute infiltrate or pleural effusion. Heart size remains at the upper limits of normal and stable. IMPRESSION: Chronic basilar predominant interstitial lung disease. No acute findings. Electronically Signed   By: Earle Gell M.D.   On: 10/18/2015 20:30   Ct Abdomen Pelvis W Contrast  09/29/2015  CLINICAL DATA:  Generalized sharp abdominal pain. Nausea and vomiting. EXAM: CT ABDOMEN AND PELVIS WITH CONTRAST TECHNIQUE: Multidetector CT imaging of the abdomen and pelvis was performed using the standard protocol following bolus administration of intravenous contrast. CONTRAST:  30mL OMNIPAQUE IOHEXOL 300 MG/ML  SOLN COMPARISON:  07/22/2015 FINDINGS: Lower chest:  Bibasilar fibrosis.  Normal heart size. Hepatobiliary: Normal liver. Mild intrahepatic biliary  ductal dilatation. Normal gallbladder. Pancreas: Normal. Spleen: Normal. Adrenals/Urinary Tract: Normal adrenal glands. No solid renal mass. No obstructive uropathy. No urolithiasis. Decompressed bladder. Stomach/Bowel: Moderate-sized hiatal hernia. Severe small bowel dilatation measuring up to 3.7 cm. The small bowel is diffusely dilated. There is dilatation of the ascending colon with an air-fluid level with a relative pointer  transition in the mid ascending colon where there is a mass slight soft tissue prominence. No pneumatosis, pneumoperitoneum or portal venous gas. Vascular/Lymphatic: Normal caliber abdominal aorta with atherosclerosis. Other: No fluid collection or hematoma. Musculoskeletal: Right total hip arthroplasty with resulting beam hardening artifact obscuring portions of the pelvis. Three cannulated screws transfixing the femoral neck. No acute osseous abnormality. No lytic or sclerotic osseous lesion. S-shaped scoliosis of the thoracolumbar spine. Severe degenerative disc disease throughout the thoracolumbar spine. Chronic T8, T9, T11, T12 and L1 vertebral body compression fractures. IMPRESSION: 1. High-grade small bowel obstruction with a transition point in the mid ascending colon. Masslike prominence in the mid ascending colon concerning for neoplasm. Recommend further evaluation with colonoscopy. Electronically Signed   By: Kathreen Devoid   On: 10/18/2015 23:55   Hillary Corinda Gubler, MD 10/26/2015, 12:56 AM PGY-1, [place] Intern pager: [phone], text pages welcome  I have seen and evaluated the patient with Dr. Ola Spurr. I am in agreement with the note above in its revised form. My additions are in red.  Liyat Faulkenberry B. Bonner Puna, MD, PGY-3 10/26/2015 6:33 AM

## 2015-10-26 NOTE — Patient Outreach (Signed)
[facility]) Care Management  10/26/2015  Caroline Lewis 1924-11-12 [id number]  Assessment: Transition of Care (week 3)- cancellation due to hospitalization Scheduled transition of care call for today cancelled due to patient's admission to the hospital (11/30).  Plan: Will contact hospital liaison to update. Follow-up with patient after hospital discharge to reschedule visit.  Fernando Stoiber A. Tuwanda Vokes, BSN, RN-BC [place] Management Coordinator Cell: [phone]

## 2015-10-26 NOTE — Progress Notes (Addendum)
Initial Nutrition Assessment  DOCUMENTATION CODES:   Severe malnutrition in context of chronic illness  INTERVENTION:   -TPN management per pharmacy -RD will follow for diet advancement  NUTRITION DIAGNOSIS:   Malnutrition related to chronic illness as evidenced by severe depletion of body fat, severe depletion of muscle mass.  GOAL:   Patient will meet greater than or equal to 90% of their needs  MONITOR:   Labs, Diet advancement, Weight trends, Skin, I & O's  REASON FOR ASSESSMENT:   Malnutrition Screening Tool    ASSESSMENT:   Caroline Lewis is a 81 y.o. female presenting with nausea, vomiting and intermittent sharp abdominal pain. PMH is significant for IBS, RA, constipation, GERD, PVD, postmenopausal bleeding, hiatal hernia, and recent DVT started on coumadin.   Pt admitted with SBO.  Per surgical notes, CT of abdomen and pelvis revealed small bowel obstruction with transition point in the mid-ascending colon, possible mass.   Hx obtained from pt and daughter at bedside. Both confirm poor appetite and weight loss over at least the past 3 weeks. Daughter reports that intake has been limited and has been offering pt "whatever she wanted to eat" to encourage PO intake. Pt reports that she often regurgitates food back up with any PO intake. Diet PTA consisted mainly of soft foods. She was no taking supplements PTA, but is willing to try Ensure when diet is advanced.   Pt reports UBW is around 107#. Weight hx reviewed; Noted no significant wt changes over the past year. Pt estimates she has lost about 7 pounds in the past 3 week. Noted a 4.8% wt loss over the past month.   Nutrition-Focused physical exam completed. Findings are severe fat depletion, severe muscle depletion, and no edema. Per pt daughter, pt is dependent on a motarized wheelchair and assists with transfers.    Plan is to start TPN with PICC and NGT placement later today. Pt daughter asked what TPN was  comprised of and this RD explained purpose and components in TPN.   Begin Clinimix E 5/15 @ 25 ml/hr and 20% IVFA @ 10 ml/hr at 1800  Labs reviewed.  Pt meets criteria for severe MALNUTRITION in the context of chronic illness as evidenced by severe fat and muscle depletion.  Diet Order:  Diet NPO time specified TPN (CLINIMIX-E) Adult  Skin:  Reviewed, no issues  Last BM:  PTA  Height:   Ht Readings from Last 1 Encounters:  10/26/15 5' (1.524 m)    Weight:   Wt Readings from Last 1 Encounters:  10/26/15 98 lb 1.7 oz (44.5 kg)    Ideal Body Weight:  45.5 kg  BMI:  Body mass index is 19.16 kg/(m^2).  Estimated Nutritional Needs:   Kcal:  1300-1500  Protein:  55-70 grams  Fluid:  1.3-1.5 L  EDUCATION NEEDS:   Education needs addressed  Carmelia Tiner A. Jimmye Norman, RD, LDN, CDE Pager: [phone] After hours Pager: [phone]

## 2015-10-26 NOTE — Progress Notes (Signed)
ANTICOAGULATION CONSULT NOTE - Initial Consult  Pharmacy Consult for Coumadin Indication: DVT  Allergies  Allergen Reactions  . Fosamax [Alendronate Sodium] Other (See Comments)    It affected my IBS    Patient Measurements: Height: 5' (152.4 cm) Weight: 98 lb 1.7 oz (44.5 kg) IBW/kg (Calculated) : 45.5  Vital Signs: Temp: 97.5 F (36.4 C) (12/01 0217) Temp Source: Oral (12/01 0217) BP: 110/69 mmHg (12/01 0217) Pulse Rate: 91 (12/01 0217)  Labs:  Recent Labs  10/19/2015 1925 10/24/2015 2203  HGB 12.0  --   HCT 39.5  --   PLT 273  --   CREATININE 0.64  --   TROPONINI  --  0.04*    Estimated Creatinine Clearance: 33.5 mL/min (by C-G formula based on Cr of 0.64).   Medical History: Past Medical History  Diagnosis Date  . Rheumatoid arthritis(714.0)   . IBS (irritable bowel syndrome)   . Constipation   . Allergy     seasonal  . Osteoporosis   . Sciatica   . Peripheral vascular disease ([place])     "beyond help"  . Pneumonia 05/25/12; 2010    Medications:  Prescriptions prior to admission  Medication Sig Dispense Refill Last Dose  . docusate sodium (ENEMEEZ) 283 MG enema Place 1 enema (283 mg total) rectally daily. (Patient taking differently: Place 1 enema rectally daily as needed for moderate constipation. ) 10 each 0 10/24/2015 at Unknown time  . fesoterodine (TOVIAZ) 8 MG TB24 tablet Take 1 tablet (8 mg total) by mouth daily. 90 tablet 3 10/24/2015 at Unknown time  . folic acid (FOLVITE) 1 MG tablet Take 1 mg by mouth daily.   10/24/2015 at Unknown time  . methotrexate (RHEUMATREX) 2.5 MG tablet Take 20 mg by mouth once a week. Caution:Chemotherapy. Protect from light. Take on Fridays   Past Week at Unknown time  . omeprazole (PRILOSEC) 40 MG capsule Take 1 capsule (40 mg total) by mouth daily. 90 capsule 3 10/24/2015 at Unknown time  . sennosides-docusate sodium (SENOKOT-S) 8.6-50 MG tablet Take 1 tablet by mouth daily as needed for constipation.   10/24/2015 at  Unknown time  . warfarin (COUMADIN) 5 MG tablet Take 1 tablet (5 mg total) by mouth one time only at 6 PM. (Patient taking differently: Take 2.5 mg by mouth daily at 6 PM. HOLD DOSE ON Monday'S EACH WEEK-UNTIL NEXT INR CHECK) 30 tablet 0 10/24/2015 at Unknown time  . ferrous sulfate 325 (65 FE) MG tablet Take 1 tablet (325 mg total) by mouth 3 (three) times daily with meals.  3 Taking  . HYDROcodone-acetaminophen (NORCO) 10-325 MG tablet Take 1 tablet by mouth every 8 (eight) hours as needed for moderate pain. (Patient not taking: Reported on 09/20/2015) 270 tablet 0 Not Taking  . magnesium citrate SOLN Take 148 mLs (0.5 Bottles total) by mouth once. (Patient not taking: Reported on 09/20/2015) 195 mL 1 Not Taking  . ondansetron (ZOFRAN) 4 MG tablet Take 1 tablet (4 mg total) by mouth every 8 (eight) hours as needed for nausea or vomiting. (Patient not taking: Reported on 10/11/2015) 30 tablet 0 Not Taking  . Simethicone 80 MG TABS Take 1 tablet (80 mg total) by mouth 4 (four) times daily -  before meals and at bedtime. (Patient not taking: Reported on 09/20/2015) 60 tablet 0 Not Taking  . [DISCONTINUED] enoxaparin (LOVENOX) 40 MG/0.4ML injection Inject 0.45 mLs (45 mg total) into the skin daily. (Patient not taking: Reported on 10/05/2015) 0.9 mL 0 Not Taking  . [  DISCONTINUED] levofloxacin (LEVAQUIN) 250 MG tablet Take 1 tablet (250 mg total) by mouth daily. (Patient not taking: Reported on 10/11/2015) 5 tablet 0 Not Taking  . [DISCONTINUED] nystatin (MYCOSTATIN) 100000 UNIT/ML suspension Take 5 mLs (500,000 Units total) by mouth 4 (four) times daily. (Patient not taking: Reported on 10/17/2015) 220 mL 0 Not Taking    Assessment: 81 y.o. female admitted with abdominal pain, h/o DVT, to continue Coumadin  Goal of Therapy:  INR 2-3 Monitor platelets by anticoagulation protocol: Yes   Plan:  F/U daily INR  Xitlali Kastens, Bronson Curb 10/26/2015,2:28 AM

## 2015-10-26 NOTE — Progress Notes (Signed)
At bedside to place PICC for IV nutrition, both right and left arm assessed with ultrasound.  Volume occupied by PICC in right would be 100% Left would be 65%, therefore PICC unable to be performed at bedside due to veins too small.  Bedside nurse Davina Poke, RN updated.  Thank you, Carolee Rota, RN VAST/PICC team.

## 2015-10-26 NOTE — Consult Note (Signed)
Subjective:   HPI  The patient is an 81 year old female who was admitted to the hospital with complaints of abdominal pain. She had a CT scan of the abdomen which showed evidence of a high-grade small bowel obstruction and a probable lesion in the mid ascending colon. We are asked to see her in regards to further evaluation including colonoscopy.  Review of Systems No chest pain or shortness of breath  Past Medical History  Diagnosis Date  . Rheumatoid arthritis(714.0)   . IBS (irritable bowel syndrome)   . Constipation   . Allergy     seasonal  . Osteoporosis   . Sciatica   . Peripheral vascular disease ([place])     "beyond help"  . Pneumonia 05/25/12; 2010   Past Surgical History  Procedure Laterality Date  . Replacement total knee  ~ 2010    left  . Facial cosmetic surgery      left cheek  . Rotator cuff repair      right  . Bunionectomy  1990's    bilateral  . Joint replacement    . Partial hip arthroplasty  05/26/12    right  . Cataract extraction w/ intraocular lens  implant, bilateral  ~ 2003  . Eye surgery  ~ 2003    right; "precancerous; took out section in lower part of eye"  . Hip arthroplasty  05/26/2012    Procedure: ARTHROPLASTY BIPOLAR HIP;  Surgeon: Hessie Dibble, MD;  Location: [place];  Service: Orthopedics;  Laterality: Right;  . Esophagogastroduodenoscopy N/A 09/21/2015    Procedure: ESOPHAGOGASTRODUODENOSCOPY (EGD);  Surgeon: Clarene Essex, MD;  Location: [facility] END[facility]COPY;  Service: Endoscopy;  Laterality: N/A;   Social History   Social History  . Marital Status: Widowed    Spouse Name: N/A  . Number of Children: 4  . Years of Education: N/A   Occupational History  . retired Network engineer    Social History Main Topics  . Smoking status: Former Smoker -- 0.10 packs/day for 50 years    Types: Cigarettes    Quit date: 11/26/1987  . Smokeless tobacco: Never Used  . Alcohol Use: No  . Drug Use: No  . Sexual Activity: No   Other Topics Concern  . Not on file    Social History Narrative   family history includes GI problems in her son; Heart disease in her father; Irritable bowel syndrome in her son; Lung cancer in her sister; Uterine cancer in her sister. There is no history of Colon cancer.  Current facility-administered medications:  .  acetaminophen (TYLENOL) tablet 650 mg, 650 mg, Oral, Q6H PRN **OR** acetaminophen (TYLENOL) suppository 650 mg, 650 mg, Rectal, Q6H PRN, Rogue Bussing, MD .  antiseptic oral rinse (CPC / CETYLPYRIDINIUM CHLORIDE 0.05%) solution 7 mL, 7 mL, Mouth Rinse, q12n4p, Dickie La, MD, 7 mL at 10/26/15 1137 .  bisacodyl (DULCOLAX) suppository 10 mg, 10 mg, Rectal, Daily PRN, Rogue Bussing, MD .  chlorhexidine (PERIDEX) 0.12 % solution 15 mL, 15 mL, Mouth Rinse, BID, Dickie La, MD, 15 mL at 10/26/15 1000 .  dextrose 5 %-0.45 % sodium chloride infusion, , Intravenous, Continuous, Rogue Bussing, MD, Last Rate: 90 mL/hr at 10/26/15 1458 .  docusate sodium (ENEMEEZ) enema 283 mg, 1 enema, Rectal, Daily PRN, Rogue Bussing, MD .  enoxaparin (LOVENOX) injection 45 mg, 1 mg/kg, Subcutaneous, Q12H, Katheren Shams, DO, 45 mg at 10/26/15 1137 .  TPN (CLINIMIX-E) Adult, , Intravenous, Continuous TPN **AND** fat emulsion  20 % infusion 240 mL, 240 mL, Intravenous, Continuous TPN, Jake Church Masters, RPH .  lidocaine (XYLOCAINE) 2 % viscous mouth solution, , , ,  .  menthol-cetylpyridinium (CEPACOL) lozenge 3 mg, 1 lozenge, Oral, PRN, Jackolyn Confer, MD Allergies  Allergen Reactions  . Fosamax [Alendronate Sodium] Other (See Comments)    It affected my IBS     Objective:     BP 109/57 mmHg  Pulse 100  Temp(Src) 97.7 F (36.5 C) (Oral)  Resp 18  Ht 5' (1.524 m)  Wt 44.5 kg (98 lb 1.7 oz)  BMI 19.16 kg/m2  SpO2 94%  No distress  Nonicteric  Heart regular rhythm no murmurs  Lungs clear  Abdomen: There are some bowel sounds, there is some distention of the abdomen, there is some  tenderness to palpation, no rebound or guarding  Laboratory No components found for: D1    Assessment:     High-grade small bowel obstruction with probable lesion in the mid ascending colon, rule out malignancy      Plan:     Continue management for small bowel obstruction. The challenge for [place] will be to clean out her colon sufficiently so that we can do an adequate colonoscopy. I doubt that we can prep her with oral laxatives given the high-grade small bowel obstruction. We will have to try enemas. This could take a few days to be effective. We will follow.

## 2015-10-26 NOTE — Progress Notes (Signed)
My continuity patient.  Seen and examined.  Discussed with daughter and patient.  As is well documented in other notes, frail, malnourished elderly female with high grade SBO with the transition point being in the ascending colon with a mass not seen on CT 8/16.  I agree with the slow deliberate approach.  That is: 1. Start with TPN to improve nutritional status. 2. Consult palliative care now.  I had long conversation with patient and daughter.  I strongly believe they should proceed with our routine, curative approach through obtaining a specific diagnosis at colonoscopy.  (It is hard to provide an accurate prognosis without a firm diagnosis.)  Given her chronic pain from SEVERE OA of the lumbar spine, chronic poor nutritional status and limited mobility prior to this acute problem, a switch to palliative care early would be appropriate - especially if the colonoscopy confirms worst case scenario.  Early consult gets the conversation started.   3. It is OK to take some time to do the colonoscopy prep - it will allow increased nutritional status. 4. Remember she has had a RECENT DVT so she is at high risk for thrombosis during this phase.  Bridge anticoag is recommended because DVT is recent. 5. Likely surgery (Rt hemicolectomy) post colonoscopy if anticipated dx is confirmed and if patient and family continue to want aggressive medical treatment.  Surgery is an obvious decision point.    Tis a high risk situation and we should take pains to fully inform the patient each step of the way.  And at each step, she has the option of switching from curative to a comfort care approach.

## 2015-10-26 DEATH — deceased

## 2015-10-27 ENCOUNTER — Inpatient Hospital Stay (HOSPITAL_COMMUNITY): Payer: Commercial Managed Care - HMO

## 2015-10-27 DIAGNOSIS — E46 Unspecified protein-calorie malnutrition: Secondary | ICD-10-CM

## 2015-10-27 LAB — COMPREHENSIVE METABOLIC PANEL
ALT: 10 U/L — ABNORMAL LOW (ref 14–54)
ANION GAP: 7 (ref 5–15)
AST: 20 U/L (ref 15–41)
Albumin: 1.9 g/dL — ABNORMAL LOW (ref 3.5–5.0)
Alkaline Phosphatase: 67 U/L (ref 38–126)
BUN: 10 mg/dL (ref 6–20)
CHLORIDE: 100 mmol/L — AB (ref 101–111)
CO2: 26 mmol/L (ref 22–32)
Calcium: 7.5 mg/dL — ABNORMAL LOW (ref 8.9–10.3)
Creatinine, Ser: 0.53 mg/dL (ref 0.44–1.00)
Glucose, Bld: 177 mg/dL — ABNORMAL HIGH (ref 65–99)
POTASSIUM: 3.6 mmol/L (ref 3.5–5.1)
Sodium: 133 mmol/L — ABNORMAL LOW (ref 135–145)
Total Bilirubin: 0.4 mg/dL (ref 0.3–1.2)
Total Protein: 4.6 g/dL — ABNORMAL LOW (ref 6.5–8.1)

## 2015-10-27 LAB — DIFFERENTIAL
BASOS PCT: 0 %
Basophils Absolute: 0 10*3/uL (ref 0.0–0.1)
EOS ABS: 0 10*3/uL (ref 0.0–0.7)
Eosinophils Relative: 0 %
Lymphocytes Relative: 5 %
Lymphs Abs: 1 10*3/uL (ref 0.7–4.0)
MONO ABS: 1 10*3/uL (ref 0.1–1.0)
Monocytes Relative: 5 %
NEUTROS ABS: 17.1 10*3/uL — AB (ref 1.7–7.7)
Neutrophils Relative %: 90 %

## 2015-10-27 LAB — CBC
HCT: 31.6 % — ABNORMAL LOW (ref 36.0–46.0)
Hemoglobin: 9.7 g/dL — ABNORMAL LOW (ref 12.0–15.0)
MCH: 25.7 pg — AB (ref 26.0–34.0)
MCHC: 30.7 g/dL (ref 30.0–36.0)
MCV: 83.6 fL (ref 78.0–100.0)
PLATELETS: 244 10*3/uL (ref 150–400)
RBC: 3.78 MIL/uL — ABNORMAL LOW (ref 3.87–5.11)
RDW: 26.4 % — AB (ref 11.5–15.5)
WBC: 19.1 10*3/uL — ABNORMAL HIGH (ref 4.0–10.5)

## 2015-10-27 LAB — PROTIME-INR
INR: 1.59 — AB (ref 0.00–1.49)
PROTHROMBIN TIME: 19 s — AB (ref 11.6–15.2)

## 2015-10-27 LAB — PHOSPHORUS: PHOSPHORUS: 2.5 mg/dL (ref 2.5–4.6)

## 2015-10-27 LAB — TRIGLYCERIDES: Triglycerides: 61 mg/dL (ref <150)

## 2015-10-27 LAB — CEA: CEA: 3 ng/mL (ref 0.0–4.7)

## 2015-10-27 LAB — MAGNESIUM: MAGNESIUM: 1.6 mg/dL — AB (ref 1.7–2.4)

## 2015-10-27 MED ORDER — PROMETHAZINE HCL 25 MG/ML IJ SOLN
6.2500 mg | Freq: Four times a day (QID) | INTRAMUSCULAR | Status: DC | PRN
  Administered 2015-10-29: 6.25 mg via INTRAVENOUS
  Filled 2015-10-27: qty 1

## 2015-10-27 MED ORDER — KCL IN DEXTROSE-NACL 20-5-0.45 MEQ/L-%-% IV SOLN
INTRAVENOUS | Status: DC
  Administered 2015-10-28: via INTRAVENOUS
  Filled 2015-10-27: qty 1000

## 2015-10-27 MED ORDER — FAT EMULSION 20 % IV EMUL
120.0000 mL | INTRAVENOUS | Status: DC
  Administered 2015-10-27: 120 mL via INTRAVENOUS
  Filled 2015-10-27 (×2): qty 200

## 2015-10-27 MED ORDER — DEXTROSE-NACL 5-0.45 % IV SOLN: INTRAVENOUS | Status: DC

## 2015-10-27 MED ORDER — TRACE MINERALS CR-CU-MN-SE-ZN 10-1000-500-60 MCG/ML IV SOLN
INTRAVENOUS | Status: DC
  Administered 2015-10-27: 17:00:00 via INTRAVENOUS
  Filled 2015-10-27 (×3): qty 600

## 2015-10-27 MED ORDER — METRONIDAZOLE IN NACL 5-0.79 MG/ML-% IV SOLN
500.0000 mg | Freq: Three times a day (TID) | INTRAVENOUS | Status: DC
  Administered 2015-10-27 – 2015-10-31 (×12): 500 mg via INTRAVENOUS
  Filled 2015-10-27 (×14): qty 100

## 2015-10-27 MED ORDER — INSULIN ASPART 100 UNIT/ML ~~LOC~~ SOLN
0.0000 [IU] | SUBCUTANEOUS | Status: DC
  Administered 2015-10-28 – 2015-10-29 (×2): 1 [IU] via SUBCUTANEOUS
  Administered 2015-10-29 (×2): 2 [IU] via SUBCUTANEOUS
  Administered 2015-10-29: 1 [IU] via SUBCUTANEOUS
  Administered 2015-10-29 – 2015-10-30 (×4): 2 [IU] via SUBCUTANEOUS

## 2015-10-27 MED ORDER — CIPROFLOXACIN IN D5W 400 MG/200ML IV SOLN
400.0000 mg | Freq: Two times a day (BID) | INTRAVENOUS | Status: DC
  Administered 2015-10-27 – 2015-10-28 (×2): 400 mg via INTRAVENOUS
  Filled 2015-10-27 (×3): qty 200

## 2015-10-27 MED ORDER — MORPHINE SULFATE (PF) 2 MG/ML IV SOLN
1.0000 mg | INTRAVENOUS | Status: DC | PRN
  Administered 2015-10-28 – 2015-10-31 (×4): 2 mg via INTRAVENOUS
  Filled 2015-10-27 (×4): qty 1

## 2015-10-27 NOTE — Progress Notes (Signed)
Patient is tired and worn out and sleeping. Discussed with family. At this point they do not want any testing done. May be moving towards palliative care. I do note in reviewing records that she had a colonoscopy 4 years ago. No evidence of tumor in the colon at that time.  Continue to treat bowel obstruction. We will be available if colonoscopy is requested in the future. Please let us know. We will follow peripherally and call us if you need [place].

## 2015-10-27 NOTE — Progress Notes (Signed)
Patient ID: Caroline Lewis, female   DOB: 06/02/1924, 81 y.o.   MRN: [id number]     [facility]      [address] [place], [place] [address]    Phone: [phone] FAX: [phone]     Subjective: No BM after enema.  Pt drowsy. Had PICC line placed by IR.  TPN is running.    Objective:  Vital signs:  Filed Vitals:   10/26/15 1305 10/26/15 2218 10/27/15 0052 10/27/15 0554  BP: 109/57 82/55 124/76 121/63  Pulse: 100 99  89  Temp: 97.7 F (36.5 C) 99.1 F (37.3 C)  97.8 F (36.6 C)  TempSrc: Oral Axillary  Oral  Resp: $Remo'18 18  17  'AaUxe$ Height:      Weight:      SpO2: 94% 92%  93%    Last BM Date:  (no results from enema)  Intake/Output   Yesterday:  12/01 0701 - 12/02 0700 In: 40 [I.V.:10; NG/GT:30] Out: 105 [Emesis/NG output:105] This shift:    I/O last 3 completed shifts: In: 1203 [P.O.:480; I.V.:693; NG/GT:30] Out: 305 [Urine:200; Emesis/NG output:105]       Physical Exam: General: Pt awake/alert/oriented x4 in no acute distress  Abdomen: +BS, abdomen is distended, non tender.     Problem List:   Active Problems:   Small bowel obstruction (HCC)   Large bowel obstruction (HCC)   Cachexia (HCC)    Results:   Labs: Results for orders placed or performed during the hospital encounter of 10/09/2015 (from the past 48 hour(s))  Lipase, blood     Status: None   Collection Time: 10/14/2015  7:25 PM  Result Value Ref Range   Lipase 24 11 - 51 U/L  Comprehensive metabolic panel     Status: Abnormal   Collection Time: 10/10/2015  7:25 PM  Result Value Ref Range   Sodium 135 135 - 145 mmol/L   Potassium 4.3 3.5 - 5.1 mmol/L   Chloride 101 101 - 111 mmol/L   CO2 23 22 - 32 mmol/L   Glucose, Bld 154 (H) 65 - 99 mg/dL   BUN 12 6 - 20 mg/dL   Creatinine, Ser 0.64 0.44 - 1.00 mg/dL   Calcium 8.3 (L) 8.9 - 10.3 mg/dL   Total Protein 6.5 6.5 - 8.1 g/dL   Albumin 2.7 (L) 3.5 - 5.0 g/dL   AST 21 15 - 41 U/L   ALT 14  14 - 54 U/L   Alkaline Phosphatase 91 38 - 126 U/L   Total Bilirubin 0.7 0.3 - 1.2 mg/dL   GFR calc non Af Amer >60 >60 mL/min   GFR calc Af Amer >60 >60 mL/min    Comment: (NOTE) The eGFR has been calculated using the CKD EPI equation. This calculation has not been validated in all clinical situations. eGFR's persistently <60 mL/min signify possible Chronic Kidney Disease.    Anion gap 11 5 - 15  CBC     Status: Abnormal   Collection Time: 10/12/2015  7:25 PM  Result Value Ref Range   WBC 19.1 (H) 4.0 - 10.5 K/uL   RBC 4.65 3.87 - 5.11 MIL/uL   Hemoglobin 12.0 12.0 - 15.0 g/dL   HCT 39.5 36.0 - 46.0 %   MCV 84.9 78.0 - 100.0 fL   MCH 25.8 (L) 26.0 - 34.0 pg   MCHC 30.4 30.0 - 36.0 g/dL   RDW 26.9 (H) 11.5 - 15.5 %   Platelets 273 150 -  400 K/uL  I-Stat CG4 Lactic Acid, ED     Status: None   Collection Time: 10/02/2015  7:41 PM  Result Value Ref Range   Lactic Acid, Venous 1.69 0.5 - 2.0 mmol/L  Troponin I     Status: Abnormal   Collection Time: 09/28/2015 10:03 PM  Result Value Ref Range   Troponin I 0.04 (H) <0.031 ng/mL    Comment:        PERSISTENTLY INCREASED TROPONIN VALUES IN THE RANGE OF 0.04-0.49 ng/mL CAN BE SEEN IN:       -UNSTABLE ANGINA       -CONGESTIVE HEART FAILURE       -MYOCARDITIS       -CHEST TRAUMA       -ARRYHTHMIAS       -LATE PRESENTING MYOCARDIAL INFARCTION       -COPD   CLINICAL FOLLOW-UP RECOMMENDED.   I-Stat CG4 Lactic Acid, ED     Status: None   Collection Time: 10/12/2015 11:52 PM  Result Value Ref Range   Lactic Acid, Venous 0.79 0.5 - 2.0 mmol/L  Troponin I     Status: Abnormal   Collection Time: 10/26/15  4:02 AM  Result Value Ref Range   Troponin I 0.05 (H) <0.031 ng/mL    Comment:        PERSISTENTLY INCREASED TROPONIN VALUES IN THE RANGE OF 0.04-0.49 ng/mL CAN BE SEEN IN:       -UNSTABLE ANGINA       -CONGESTIVE HEART FAILURE       -MYOCARDITIS       -CHEST TRAUMA       -ARRYHTHMIAS       -LATE PRESENTING MYOCARDIAL  INFARCTION       -COPD   CLINICAL FOLLOW-UP RECOMMENDED.   Troponin I     Status: Abnormal   Collection Time: 10/26/15  7:40 AM  Result Value Ref Range   Troponin I 0.05 (H) <0.031 ng/mL    Comment:        PERSISTENTLY INCREASED TROPONIN VALUES IN THE RANGE OF 0.04-0.49 ng/mL CAN BE SEEN IN:       -UNSTABLE ANGINA       -CONGESTIVE HEART FAILURE       -MYOCARDITIS       -CHEST TRAUMA       -ARRYHTHMIAS       -LATE PRESENTING MYOCARDIAL INFARCTION       -COPD   CLINICAL FOLLOW-UP RECOMMENDED.   Basic metabolic panel     Status: Abnormal   Collection Time: 10/26/15  7:40 AM  Result Value Ref Range   Sodium 135 135 - 145 mmol/L   Potassium 3.8 3.5 - 5.1 mmol/L   Chloride 104 101 - 111 mmol/L   CO2 22 22 - 32 mmol/L   Glucose, Bld 115 (H) 65 - 99 mg/dL   BUN 11 6 - 20 mg/dL   Creatinine, Ser 0.48 0.44 - 1.00 mg/dL   Calcium 7.5 (L) 8.9 - 10.3 mg/dL   GFR calc non Af Amer >60 >60 mL/min   GFR calc Af Amer >60 >60 mL/min    Comment: (NOTE) The eGFR has been calculated using the CKD EPI equation. This calculation has not been validated in all clinical situations. eGFR's persistently <60 mL/min signify possible Chronic Kidney Disease.    Anion gap 9 5 - 15  CBC     Status: Abnormal   Collection Time: 10/26/15  7:40 AM  Result Value Ref Range   WBC 17.5 (  H) 4.0 - 10.5 K/uL   RBC 3.91 3.87 - 5.11 MIL/uL   Hemoglobin 10.0 (L) 12.0 - 15.0 g/dL   HCT 67.9 (L) 38.5 - 37.0 %   MCV 84.1 78.0 - 100.0 fL   MCH 25.6 (L) 26.0 - 34.0 pg   MCHC 30.4 30.0 - 36.0 g/dL   RDW 83.3 (H) 27.3 - 80.9 %   Platelets 220 150 - 400 K/uL  Protime-INR     Status: Abnormal   Collection Time: 10/26/15  7:40 AM  Result Value Ref Range   Prothrombin Time 17.4 (H) 11.6 - 15.2 seconds   INR 1.41 0.00 - 1.49  CEA     Status: None   Collection Time: 10/26/15  9:50 AM  Result Value Ref Range   CEA 3.0 0.0 - 4.7 ng/mL    Comment: (NOTE)       Roche ECLIA methodology       Nonsmokers  <3.9                                      Smokers     <5.6 Performed At: [facility] [address] [place], [place] [id number] Mila Homer MD [id number]   Prealbumin     Status: Abnormal   Collection Time: 10/26/15  9:50 AM  Result Value Ref Range   Prealbumin 5.6 (L) 18 - 38 mg/dL  Magnesium     Status: Abnormal   Collection Time: 10/26/15 12:10 PM  Result Value Ref Range   Magnesium 1.6 (L) 1.7 - 2.4 mg/dL  Phosphorus     Status: None   Collection Time: 10/26/15 12:10 PM  Result Value Ref Range   Phosphorus 3.1 2.5 - 4.6 mg/dL  Prealbumin     Status: Abnormal   Collection Time: 10/26/15 12:10 PM  Result Value Ref Range   Prealbumin 5.3 (L) 18 - 38 mg/dL  Troponin I     Status: Abnormal   Collection Time: 10/26/15  4:00 PM  Result Value Ref Range   Troponin I 0.05 (H) <0.031 ng/mL    Comment:        PERSISTENTLY INCREASED TROPONIN VALUES IN THE RANGE OF 0.04-0.49 ng/mL CAN BE SEEN IN:       -UNSTABLE ANGINA       -CONGESTIVE HEART FAILURE       -MYOCARDITIS       -CHEST TRAUMA       -ARRYHTHMIAS       -LATE PRESENTING MYOCARDIAL INFARCTION       -COPD   CLINICAL FOLLOW-UP RECOMMENDED.   Protime-INR     Status: Abnormal   Collection Time: 10/27/15  5:00 AM  Result Value Ref Range   Prothrombin Time 19.0 (H) 11.6 - 15.2 seconds   INR 1.59 (H) 0.00 - 1.49  Comprehensive metabolic panel     Status: Abnormal   Collection Time: 10/27/15  5:00 AM  Result Value Ref Range   Sodium 133 (L) 135 - 145 mmol/L   Potassium 3.6 3.5 - 5.1 mmol/L   Chloride 100 (L) 101 - 111 mmol/L   CO2 26 22 - 32 mmol/L   Glucose, Bld 177 (H) 65 - 99 mg/dL   BUN 10 6 - 20 mg/dL   Creatinine, Ser 6.23 0.44 - 1.00 mg/dL   Calcium 7.5 (L) 8.9 - 10.3 mg/dL   Total Protein 4.6 (L) 6.5 - 8.1 g/dL  Albumin 1.9 (L) 3.5 - 5.0 g/dL   AST 20 15 - 41 U/L   ALT 10 (L) 14 - 54 U/L   Alkaline Phosphatase 67 38 - 126 U/L   Total Bilirubin 0.4 0.3 - 1.2 mg/dL   GFR calc non Af Amer >60 >60 mL/min    GFR calc Af Amer >60 >60 mL/min    Comment: (NOTE) The eGFR has been calculated using the CKD EPI equation. This calculation has not been validated in all clinical situations. eGFR's persistently <60 mL/min signify possible Chronic Kidney Disease.    Anion gap 7 5 - 15  Magnesium     Status: Abnormal   Collection Time: 10/27/15  5:00 AM  Result Value Ref Range   Magnesium 1.6 (L) 1.7 - 2.4 mg/dL  Phosphorus     Status: None   Collection Time: 10/27/15  5:00 AM  Result Value Ref Range   Phosphorus 2.5 2.5 - 4.6 mg/dL  Triglycerides     Status: None   Collection Time: 10/27/15  5:00 AM  Result Value Ref Range   Triglycerides 61 <150 mg/dL  CBC     Status: Abnormal   Collection Time: 10/27/15  5:00 AM  Result Value Ref Range   WBC 19.1 (H) 4.0 - 10.5 K/uL   RBC 3.78 (L) 3.87 - 5.11 MIL/uL   Hemoglobin 9.7 (L) 12.0 - 15.0 g/dL   HCT 31.6 (L) 36.0 - 46.0 %   MCV 83.6 78.0 - 100.0 fL   MCH 25.7 (L) 26.0 - 34.0 pg   MCHC 30.7 30.0 - 36.0 g/dL   RDW 26.4 (H) 11.5 - 15.5 %   Platelets 244 150 - 400 K/uL  Differential     Status: Abnormal   Collection Time: 10/27/15  5:00 AM  Result Value Ref Range   Neutrophils Relative % 90 %   Lymphocytes Relative 5 %   Monocytes Relative 5 %   Eosinophils Relative 0 %   Basophils Relative 0 %   Neutro Abs 17.1 (H) 1.7 - 7.7 K/uL   Lymphs Abs 1.0 0.7 - 4.0 K/uL   Monocytes Absolute 1.0 0.1 - 1.0 K/uL   Eosinophils Absolute 0.0 0.0 - 0.7 K/uL   Basophils Absolute 0.0 0.0 - 0.1 K/uL   RBC Morphology POLYCHROMASIA PRESENT     Comment: ELLIPTOCYTES    Imaging / Studies: Dg Chest 2 View  10/10/2015  CLINICAL DATA:  Epigastric pain.  nausea and vomiting. EXAM: CHEST  2 VIEW COMPARISON:  09/20/2015 FINDINGS: Chronic interstitial lung disease again seen with bibasilar predominance. No evidence of acute infiltrate or pleural effusion. Heart size remains at the upper limits of normal and stable. IMPRESSION: Chronic basilar predominant interstitial  lung disease. No acute findings. Electronically Signed   By: Earle Gell M.D.   On: 10/01/2015 20:30   Dg Abd 1 View  10/26/2015  CLINICAL DATA:  Nasogastric tube placed under fluoroscopy. Fluoro time 3 minutes and 42 seconds. EXAM: ABDOMEN - 1 VIEW COMPARISON:  CT, 10/17/2015 FINDINGS: Nasogastric tube passes below the diaphragm to curl within the mid to distal stomach, well positioned. IMPRESSION: Well-positioned nasogastric tube. Electronically Signed   By: Lajean Manes M.D.   On: 10/26/2015 14:54   Ct Abdomen Pelvis W Contrast  10/19/2015  CLINICAL DATA:  Generalized sharp abdominal pain. Nausea and vomiting. EXAM: CT ABDOMEN AND PELVIS WITH CONTRAST TECHNIQUE: Multidetector CT imaging of the abdomen and pelvis was performed using the standard protocol following bolus administration of intravenous contrast.  CONTRAST:  82mL OMNIPAQUE IOHEXOL 300 MG/ML  SOLN COMPARISON:  07/22/2015 FINDINGS: Lower chest:  Bibasilar fibrosis.  Normal heart size. Hepatobiliary: Normal liver. Mild intrahepatic biliary ductal dilatation. Normal gallbladder. Pancreas: Normal. Spleen: Normal. Adrenals/Urinary Tract: Normal adrenal glands. No solid renal mass. No obstructive uropathy. No urolithiasis. Decompressed bladder. Stomach/Bowel: Moderate-sized hiatal hernia. Severe small bowel dilatation measuring up to 3.7 cm. The small bowel is diffusely dilated. There is dilatation of the ascending colon with an air-fluid level with a relative pointer transition in the mid ascending colon where there is a mass slight soft tissue prominence. No pneumatosis, pneumoperitoneum or portal venous gas. Vascular/Lymphatic: Normal caliber abdominal aorta with atherosclerosis. Other: No fluid collection or hematoma. Musculoskeletal: Right total hip arthroplasty with resulting beam hardening artifact obscuring portions of the pelvis. Three cannulated screws transfixing the femoral neck. No acute osseous abnormality. No lytic or sclerotic osseous  lesion. S-shaped scoliosis of the thoracolumbar spine. Severe degenerative disc disease throughout the thoracolumbar spine. Chronic T8, T9, T11, T12 and L1 vertebral body compression fractures. IMPRESSION: 1. High-grade small bowel obstruction with a transition point in the mid ascending colon. Masslike prominence in the mid ascending colon concerning for neoplasm. Recommend further evaluation with colonoscopy. Electronically Signed   By: Kathreen Devoid   On: 10/17/2015 23:55   Ir Fluoro Guide Cv Line Right  10/26/2015  CLINICAL DATA:  81 year old female with small bowel obstruction in need for durable central venous access for hydration and nutrition. EXAM: IR RIGHT FLOURO GUIDE CV LINE; IR ULTRASOUND GUIDANCE VASC ACCESS RIGHT FLUOROSCOPY TIME:  2 minutes 16 seconds for a total of 8.4 mGy TECHNIQUE: The right arm was prepped with chlorhexidine, draped in the usual sterile fashion using maximum barrier technique (cap and mask, sterile gown, sterile gloves, large sterile sheet, hand hygiene and cutaneous antiseptic). Local anesthesia was attained by infiltration with 1% lidocaine. Ultrasound demonstrated patency of the right brachial vein, and this was documented with an image. Under real-time ultrasound guidance, this vein was accessed with a 21 gauge micropuncture needle and image documentation was performed. The needle was exchanged over a guidewire for a peel-away sheath through which a 6 [place] dual lumen power injectable PICC was advanced, and positioned with its tip at the lower SVC/right atrial junction. Fluoroscopy during the procedure and fluoro spot radiograph confirms appropriate catheter position. The catheter was flushed, secured to the skin with Prolene sutures, and covered with a sterile dressing. COMPLICATIONS: None.  The patient tolerated the procedure well. IMPRESSION: Successful placement of a right arm dual-lumen power injectable PICC with sonographic and fluoroscopic guidance. The catheter is  ready for use. Signed, Criselda Peaches, MD [facility] Electronically Signed   By: Jacqulynn Cadet M.D.   On: 10/26/2015 17:40   Ir US Guide Vasc Access Right  10/26/2015  CLINICAL DATA:  81 year old female with small bowel obstruction in need for durable central venous access for hydration and nutrition. EXAM: IR RIGHT FLOURO GUIDE CV LINE; IR ULTRASOUND GUIDANCE VASC ACCESS RIGHT FLUOROSCOPY TIME:  2 minutes 16 seconds for a total of 8.4 mGy TECHNIQUE: The right arm was prepped with chlorhexidine, draped in the usual sterile fashion using maximum barrier technique (cap and mask, sterile gown, sterile gloves, large sterile sheet, hand hygiene and cutaneous antiseptic). Local anesthesia was attained by infiltration with 1% lidocaine. Ultrasound demonstrated patency of the right brachial vein, and this was documented with an image. Under real-time ultrasound guidance, this vein was accessed with a  21 gauge micropuncture needle and image documentation was performed. The needle was exchanged over a guidewire for a peel-away sheath through which a 6 [place] dual lumen power injectable PICC was advanced, and positioned with its tip at the lower SVC/right atrial junction. Fluoroscopy during the procedure and fluoro spot radiograph confirms appropriate catheter position. The catheter was flushed, secured to the skin with Prolene sutures, and covered with a sterile dressing. COMPLICATIONS: None.  The patient tolerated the procedure well. IMPRESSION: Successful placement of a right arm dual-lumen power injectable PICC with sonographic and fluoroscopic guidance. The catheter is ready for use. Signed, Criselda Peaches, MD [facility] Electronically Signed   By: Jacqulynn Cadet M.D.   On: 10/26/2015 17:40   Dg Addison Bailey G Tube Plc W/fl-no Rad  10/26/2015  CLINICAL DATA:  NASO G TUBE PLACEMENT WITH  FLUORO Fluoroscopy was utilized by the requesting physician.  No radiographic interpretation.    Medications / Allergies:  Scheduled Meds: . antiseptic oral rinse  7 mL Mouth Rinse q12n4p  . chlorhexidine  15 mL Mouth Rinse BID  . enoxaparin (LOVENOX) injection  1 mg/kg Subcutaneous Q12H  . sodium chloride  10-40 mL Intracatheter Q12H  . sodium phosphate  1 enema Rectal BID   Continuous Infusions: . dextrose 5 % and 0.45% NaCl 90 mL/hr at 10/27/15 0139  . Marland KitchenTPN (CLINIMIX-E) Adult 25 mL/hr at 10/26/15 1758   And  . fat emulsion 240 mL (10/26/15 1758)   PRN Meds:.acetaminophen **OR** acetaminophen, bisacodyl, docusate sodium, menthol-cetylpyridinium, sodium chloride  Antibiotics: Anti-infectives    None        Assessment/Plan Small bowel obstruction 2/2 right colon mass, probable neoplasm  -continue NPO, NGT decompression, check films -colonoscopy per GI for diagnosis -agree with palliative care -CEA 3 PCM-pre-albumin 5.3, TPN started 12/1 DVT(Dx 08/27/15)-coumadin on hold, lovenox   I spoke with her daughter at bedside.   Erby Pian, [facility] Pager [phone]) For consults and floor pages call [phone]([id number])  10/27/2015 8:31 AM

## 2015-10-27 NOTE — Progress Notes (Signed)
The CNA on duty called me and said while she was doing ADLs and pt grab the side rails the PICC line was pulled out accidentally, no active bleeding noted , informed on call MD, IV team and son at the bedside.

## 2015-10-27 NOTE — Progress Notes (Signed)
RN called to patient's room. Patient daughter requested that we, "Let her rest today. I don't want her being poked at today with getting blood drawn and things like that. I want her to get stronger so she can tell us what she wants done." RN asked could blood sugars be checked and could we give her the scheduled enema. Daughter does not want this done at this time.

## 2015-10-27 NOTE — Progress Notes (Signed)
Patient is exhausted today and requests no further tests procedures.  I concur.  We want her to get strong enough to hold a good goals of care discussion.  TPN and NG decompression of SBO ongoing.  Daughters concerned about why colonoscopy not done earlier.  Serendipitously, team has discovered that she had a colonoscopy done 4 years ago which only showed diverticulosis of the ascending colon.  That information was conveyed to the daughters.  While we still do not have a firm diagnosis, the recent colonoscopy does change my thinking some: 1. The likelihood of a primary colon cancer is markedly less.  It would be highly unusual to go from a normal colon mucosa to large, obstructing tumor in just 4 years.   2. I now believe an empiric trial of antibiotics is warranted, specifically treating diverticulitis.  Favoring that possibility is her elevated WBC count and diverticulosis seen on colonoscopy.  Making that possibility less likely is the non typical appearance on CT and lack of fever.  On balance, I favor treatment.  My impression is that the patient is moving quickly to a palliative care approach (the daughters, less quickly).  I have assured the daughters that we will go slow with our approach in order to give Ms Crupi the best opportunity for her to speak for herself when it comes to goals of care.  I also reiterated that it is difficult to prognose without a firm diagnosis.  So, I will likely continue to recommend colonoscopy in hopes of establishing a firm diagnosis to help best inform a decision about comfort care versus continued aggressive medical care.

## 2015-10-27 NOTE — Progress Notes (Signed)
Family Medicine Teaching Service Daily Progress Note Intern Pager: [phone]  Patient name: Caroline Lewis Medical record number: [id number] Date of birth: 02-06-1924 Age: 81 y.o. Gender: female  Primary Care Provider: Zigmund Gottron, MD Consultants: Gen Surg, GI  Code Status: DNR  Pt Overview and Major Events to Date:  11/30: Admitted for SBO   Assessment and Plan: Caroline Lewis is a 81 y.o. female presenting with nausea, vomiting and intermittent sharp abdominal pain. PMH is significant for IBS, RA, constipation, GERD, PVD, postmenopausal bleeding, hiatal hernia, and recent DVT started on coumadin.   Small bowel obstruction: Seen on CT abdomen/pelvis. Also with colonic mass concerning for neoplasm as possible source of inflammation that could have caused obstruction. NG tube placed under fluoro.  KUB from 12/2 demonstrates improvement in bowel obstruction without free air. CEA WNL at 3.0.   - General surgery consult; appreciate recommendations - NPO. IVFs at 90 ml/hr.  - GI consulted, appreciate recs >>> will proceed with enema prep for colonoscopy  - Tylenol prn for pain.  - Monitor I/Os -will consult palliative care    SIRS: As stated above, patient is tachycardic with leukocytosis. She is afebrile and appears nontoxic with normal lactic acid, so holding off on starting antibiotics. Consider IVF bolus if continues to be tachycardic, though would be cautious with grade 2 diastolic dysfunction on echo 09/22/2015, crackles, and pedal edema (worsened by hypoalbuminemia). - Monitor for fever; has been afebrile since admission  - Repeat CBC in am: WBC 19.1 (12/2)  Chest pain: Low suspicion for ACS given normal EKG, but troponin of 0.04 in ED. Suspect GERD given patient's history.  - Trending troponins; last two were 0.05  - Will resume home protonix once no longer NPO   Constipation: Chronic, has not had a bowel movement in 2 weeks, per family but small BMs with  enemas. - ducolax suppository ordered prn - home ducusate sodium enema ordered prn  Recent DVT: INR 1.59 (12/2)  -Lovenox at treatment dose   Dysphagia: Thought to be due to candidal esophagitis at last admission.  - Ordered bedside SLP consult.   Anemia of chronic disease: HgB 9.7 (12.0) Improved from 8.0 a month ago when patient had hematemesis. During last admission, anemia panel significant for borderline low folate at 5.9, iron, ferretin and TIBC. - Holding home ferrous sulfate and folic acid as NPO.  - Consider IV iron due to her propensity towards constipation.  RA: Patient has decreased mobility 2/2 RA and gets around at home with an electric scooter.  - Continue home once no longer NPO.  - Patient takes weekly methotrexate 20 mg on Fridays. Will hold off on ordering as patient is still NPO.   LE Edema: Improved with treatment of DVT. HFpEF, with normal EF and grade 2 diastolic dysfunction on ECHO 11/28.  - Continue to monitor.   Protein calorie malnutrition: With excessive muscular atrophy and ongoing weight loss possibly related to neoplasm and/or inanition. Chronic constipation now with SBO likely contributing to nausea and feeling of fullness. Likely need to r/o neoplasm with colonoscopy.  - Patient has lost nearly 7 lbs since last office visit 09/28/15.  - Nutrition consult prior to discharge -TPN started on 12/1   Cough: Recently treated as HCAP with levofloxacin. Completed course.  - Patient saturating 92-96% on room air. - No evidence of PNA on CXR.   FEN/GI: NPO for surgery. D51/2NS @ 90 ml/hr while NPO.  Prophylaxis: Lovenox   Disposition: Pending management of SBO  and colonic mass   Subjective:  Patient has not yet had BM with enema yet. Resting comfortably in bed.   Objective: Temp:  [97.7 F (36.5 C)-99.1 F (37.3 C)] 97.8 F (36.6 C) (12/02 0554) Pulse Rate:  [89-100] 89 (12/02 0554) Resp:  [17-18] 17 (12/02 0554) BP: (82-124)/(55-76) 121/63  mmHg (12/02 0554) SpO2:  [92 %-94 %] 93 % (12/02 0554) Physical Exam: General: cachetic elderly female resting in bed in NAD  Cardiovascular: RRR Abdomen: +BS, distended, no TTP, no masses appreciated, no guarding or rebound tenderness    Laboratory:  Recent Labs Lab 10/19/2015 1925 10/26/15 0740 10/27/15 0500  WBC 19.1* 17.5* 19.1*  HGB 12.0 10.0* 9.7*  HCT 39.5 32.9* 31.6*  PLT 273 220 244    Recent Labs Lab 10/03/2015 1925 10/26/15 0740 10/27/15 0500  NA 135 135 133*  K 4.3 3.8 3.6  CL 101 104 100*  CO2 23 22 26   BUN 12 11 10   CREATININE 0.64 0.48 0.53  CALCIUM 8.3* 7.5* 7.5*  PROT 6.5  --  4.6*  BILITOT 0.7  --  0.4  ALKPHOS 91  --  67  ALT 14  --  10*  AST 21  --  20  GLUCOSE 154* 115* 177*     Imaging/Diagnostic Tests: Dg Chest 2 View  09/29/2015  CLINICAL DATA:  Epigastric pain.  nausea and vomiting. EXAM: CHEST  2 VIEW COMPARISON:  09/20/2015 FINDINGS: Chronic interstitial lung disease again seen with bibasilar predominance. No evidence of acute infiltrate or pleural effusion. Heart size remains at the upper limits of normal and stable. IMPRESSION: Chronic basilar predominant interstitial lung disease. No acute findings. Electronically Signed   By: Earle Gell M.D.   On: 10/14/2015 20:30   Dg Abd 1 View  10/26/2015  CLINICAL DATA:  Nasogastric tube placed under fluoroscopy. Fluoro time 3 minutes and 42 seconds. EXAM: ABDOMEN - 1 VIEW COMPARISON:  CT, 10/24/2015 FINDINGS: Nasogastric tube passes below the diaphragm to curl within the mid to distal stomach, well positioned. IMPRESSION: Well-positioned nasogastric tube. Electronically Signed   By: Lajean Manes M.D.   On: 10/26/2015 14:54   Ct Abdomen Pelvis W Contrast  10/16/2015  CLINICAL DATA:  Generalized sharp abdominal pain. Nausea and vomiting. EXAM: CT ABDOMEN AND PELVIS WITH CONTRAST TECHNIQUE: Multidetector CT imaging of the abdomen and pelvis was performed using the standard protocol following bolus  administration of intravenous contrast. CONTRAST:  94mL OMNIPAQUE IOHEXOL 300 MG/ML  SOLN COMPARISON:  07/22/2015 FINDINGS: Lower chest:  Bibasilar fibrosis.  Normal heart size. Hepatobiliary: Normal liver. Mild intrahepatic biliary ductal dilatation. Normal gallbladder. Pancreas: Normal. Spleen: Normal. Adrenals/Urinary Tract: Normal adrenal glands. No solid renal mass. No obstructive uropathy. No urolithiasis. Decompressed bladder. Stomach/Bowel: Moderate-sized hiatal hernia. Severe small bowel dilatation measuring up to 3.7 cm. The small bowel is diffusely dilated. There is dilatation of the ascending colon with an air-fluid level with a relative pointer transition in the mid ascending colon where there is a mass slight soft tissue prominence. No pneumatosis, pneumoperitoneum or portal venous gas. Vascular/Lymphatic: Normal caliber abdominal aorta with atherosclerosis. Other: No fluid collection or hematoma. Musculoskeletal: Right total hip arthroplasty with resulting beam hardening artifact obscuring portions of the pelvis. Three cannulated screws transfixing the femoral neck. No acute osseous abnormality. No lytic or sclerotic osseous lesion. S-shaped scoliosis of the thoracolumbar spine. Severe degenerative disc disease throughout the thoracolumbar spine. Chronic T8, T9, T11, T12 and L1 vertebral body compression fractures. IMPRESSION: 1. High-grade small bowel obstruction with  a transition point in the mid ascending colon. Masslike prominence in the mid ascending colon concerning for neoplasm. Recommend further evaluation with colonoscopy. Electronically Signed   By: Kathreen Devoid   On: 10/01/2015 23:55   Ir Fluoro Guide Cv Line Right  10/26/2015  CLINICAL DATA:  81 year old female with small bowel obstruction in need for durable central venous access for hydration and nutrition. EXAM: IR RIGHT FLOURO GUIDE CV LINE; IR ULTRASOUND GUIDANCE VASC ACCESS RIGHT FLUOROSCOPY TIME:  2 minutes 16 seconds for a total  of 8.4 mGy TECHNIQUE: The right arm was prepped with chlorhexidine, draped in the usual sterile fashion using maximum barrier technique (cap and mask, sterile gown, sterile gloves, large sterile sheet, hand hygiene and cutaneous antiseptic). Local anesthesia was attained by infiltration with 1% lidocaine. Ultrasound demonstrated patency of the right brachial vein, and this was documented with an image. Under real-time ultrasound guidance, this vein was accessed with a 21 gauge micropuncture needle and image documentation was performed. The needle was exchanged over a guidewire for a peel-away sheath through which a 6 [place] dual lumen power injectable PICC was advanced, and positioned with its tip at the lower SVC/right atrial junction. Fluoroscopy during the procedure and fluoro spot radiograph confirms appropriate catheter position. The catheter was flushed, secured to the skin with Prolene sutures, and covered with a sterile dressing. COMPLICATIONS: None.  The patient tolerated the procedure well. IMPRESSION: Successful placement of a right arm dual-lumen power injectable PICC with sonographic and fluoroscopic guidance. The catheter is ready for use. Signed, Criselda Peaches, MD [facility]iology Electronically Signed   By: Jacqulynn Cadet M.D.   On: 10/26/2015 17:40   Ir US Guide Vasc Access Right  10/26/2015  CLINICAL DATA:  81 year old female with small bowel obstruction in need for durable central venous access for hydration and nutrition. EXAM: IR RIGHT FLOURO GUIDE CV LINE; IR ULTRASOUND GUIDANCE VASC ACCESS RIGHT FLUOROSCOPY TIME:  2 minutes 16 seconds for a total of 8.4 mGy TECHNIQUE: The right arm was prepped with chlorhexidine, draped in the usual sterile fashion using maximum barrier technique (cap and mask, sterile gown, sterile gloves, large sterile sheet, hand hygiene and cutaneous antiseptic). Local anesthesia was attained by infiltration  with 1% lidocaine. Ultrasound demonstrated patency of the right brachial vein, and this was documented with an image. Under real-time ultrasound guidance, this vein was accessed with a 21 gauge micropuncture needle and image documentation was performed. The needle was exchanged over a guidewire for a peel-away sheath through which a 6 [place] dual lumen power injectable PICC was advanced, and positioned with its tip at the lower SVC/right atrial junction. Fluoroscopy during the procedure and fluoro spot radiograph confirms appropriate catheter position. The catheter was flushed, secured to the skin with Prolene sutures, and covered with a sterile dressing. COMPLICATIONS: None.  The patient tolerated the procedure well. IMPRESSION: Successful placement of a right arm dual-lumen power injectable PICC with sonographic and fluoroscopic guidance. The catheter is ready for use. Signed, Criselda Peaches, MD [facility] Radiology Electronically Signed   By: Jacqulynn Cadet M.D.   On: 10/26/2015 17:40   Dg Addison Bailey G Tube Plc W/fl-no Rad  10/26/2015  CLINICAL DATA:  NASO G TUBE PLACEMENT WITH FLUORO Fluoroscopy was utilized by the requesting physician.  No radiographic interpretation.    Nicolette Bang, DO 10/27/2015, 7:00 AM PGY-1, [place] Intern pager: [phone], text pages welcome

## 2015-10-27 NOTE — Progress Notes (Signed)
Patient did not have a BM after enema. RN assessed patient several times throughout the night concerning BM but patient never had one.

## 2015-10-27 NOTE — Progress Notes (Addendum)
PARENTERAL NUTRITION CONSULT NOTE - Follow Up  Pharmacy Consult for TPN Indication: Small bowel obstruction  Allergies  Allergen Reactions  . Fosamax [Alendronate Sodium] Other (See Comments)    It affected my IBS   Patient Measurements: Height: 5' (152.4 cm) Weight: 98 lb 1.7 oz (44.5 kg) IBW/kg (Calculated) : 45.5   Vital Signs: Temp: 97.8 F (36.6 C) (12/02 0554) Temp Source: Oral (12/02 0554) BP: 121/63 mmHg (12/02 0554) Pulse Rate: 89 (12/02 0554) Intake/Output from previous day: 12/01 0701 - 12/02 0700 In: 40 [I.V.:10; NG/GT:30] Out: 105 [Emesis/NG output:105]  Labs:  Recent Labs  10/09/2015 1925 10/26/15 0740 10/27/15 0500  WBC 19.1* 17.5* 19.1*  HGB 12.0 10.0* 9.7*  HCT 39.5 32.9* 31.6*  PLT 273 220 244  INR  --  1.41 1.59*    Recent Labs  10/04/2015 1925 10/26/15 0740 10/26/15 0950 10/26/15 1210 10/27/15 0500  NA 135 135  --   --  133*  K 4.3 3.8  --   --  3.6  CL 101 104  --   --  100*  CO2 23 22  --   --  26  GLUCOSE 154* 115*  --   --  177*  BUN 12 11  --   --  10  CREATININE 0.64 0.48  --   --  0.53  CALCIUM 8.3* 7.5*  --   --  7.5*  MG  --   --   --  1.6* 1.6*  PHOS  --   --   --  3.1 2.5  PROT 6.5  --   --   --  4.6*  ALBUMIN 2.7*  --   --   --  1.9*  AST 21  --   --   --  20  ALT 14  --   --   --  10*  ALKPHOS 91  --   --   --  67  BILITOT 0.7  --   --   --  0.4  PREALBUMIN  --   --  5.6* 5.3*  --   TRIG  --   --   --   --  61   Estimated Creatinine Clearance: 33.5 mL/min (by C-G formula based on Cr of 0.53).   Insulin Requirements in the past 24 hours:  None  Current Nutrition:  Reports good appetite PTA, but unable to eat much food, weight loss, decreased albumin and total protein.  She is malnourished and cachetic.  D5-1/2NS @ 90 ml/hr  Assessment: 81 year old female c/o N/V, constipation, sharp abdominal pain. States she has not had BM in ~ 2 weeks, has had 7 lb weight loss in one month. Surgery was consulted, CT shows a  nearly completely obstructed R colonic mass.  Surgeries/Procedures: Pt is to have colonoscopy and partial colectomy  GI: Albumin 2.7>>1.9, recent weight loss, see assessment above, no BM in 2 weeks Endo: glucose up 177 - no hx. of EM Lytes: Corrected calcium = 9.18 mg/dL  Mild hypokalemia and hypomagnesemia present - may reflect some re-feeding syndrome with the start of TPN and the degree of malnutrition.  Renal: SCr 0.5 stable, D5-1/2NS at 43ml/hr Pulm: RA Cards: VSS Hepatobil: LFTs/Tbili wnl Neuro: A&O AC/Heme: On warfarin PTA for DVT in Oct 2016, INR subtherapeutic on admission, now on Lovenox ID:  WBC 19.2 > 17.5, LA wnl, afebrile, no abx Best Practices: Lovenox TPN Access: PICC to be placed today TPN start date: 12/1 >  Nutritional Goals:  ~  1350 kcal (1300-1500 per RD) 70-90 g AA (55-70 g per RD)  Plan:  - Increase Clinimix E 5/15 to 54ml/hr and 20% IVFA @ 5 ml/hr to provide ~ 1400 kcal and 66 g protein.  Will change her lipids to every other day if she tolerates. -Add MVI and TE to TPN -Monitor lytes and glucose - add sliding scale coverage -F/u RD assessment  -Decrease IVF with rate change this evening  Rober Minion, PharmD., MS Clinical Pharmacist Pager:  [phone] Thank you for allowing pharmacy to be part of this patients care team. 10/27/2015,8:57 AM

## 2015-10-27 NOTE — Clinical Social Work Note (Signed)
CSW received referral for SNF.  PT is recommending home health, CSW to sign off please re-consult if social work needs arise.  Jones Broom. [place], MSW, [place]

## 2015-10-27 NOTE — Progress Notes (Signed)
PT Cancellation Note  Patient Details Name: Caroline Lewis MRN: [id number] DOB: July 22, 1924   Cancelled Treatment:    Reason Eval/Treat Not Completed: Other (comment) (Family wants mother to rest today.) Family informed of what PT would do to help mother.  Family asked PT to check back at a later time.     Heywood Footman, [place] - Office   10/27/2015, 3:47 PM

## 2015-10-28 ENCOUNTER — Inpatient Hospital Stay (HOSPITAL_COMMUNITY): Payer: Commercial Managed Care - HMO

## 2015-10-28 DIAGNOSIS — D638 Anemia in other chronic diseases classified elsewhere: Secondary | ICD-10-CM

## 2015-10-28 LAB — GLUCOSE, CAPILLARY: GLUCOSE-CAPILLARY: 147 mg/dL — AB (ref 65–99)

## 2015-10-28 LAB — PROTIME-INR
INR: 1.32 (ref 0.00–1.49)
Prothrombin Time: 16.5 seconds — ABNORMAL HIGH (ref 11.6–15.2)

## 2015-10-28 MED ORDER — LIDOCAINE HCL 1 % IJ SOLN
INTRAMUSCULAR | Status: AC
  Filled 2015-10-28: qty 20

## 2015-10-28 MED ORDER — DOCUSATE SODIUM 283 MG RE ENEM
1.0000 | ENEMA | Freq: Every day | RECTAL | Status: DC
  Filled 2015-10-28: qty 1

## 2015-10-28 MED ORDER — POLYETHYLENE GLYCOL 3350 17 G PO PACK
34.0000 g | PACK | ORAL | Status: AC
  Administered 2015-10-28 – 2015-10-29 (×6): 34 g via ORAL
  Filled 2015-10-28 (×5): qty 2

## 2015-10-28 MED ORDER — TRACE MINERALS CR-CU-MN-SE-ZN 10-1000-500-60 MCG/ML IV SOLN
INTRAVENOUS | Status: AC
  Administered 2015-10-28: 18:00:00 via INTRAVENOUS
  Filled 2015-10-28: qty 1320

## 2015-10-28 MED ORDER — MAGNESIUM SULFATE 2 GM/50ML IV SOLN
2.0000 g | Freq: Once | INTRAVENOUS | Status: AC
  Administered 2015-10-28: 2 g via INTRAVENOUS
  Filled 2015-10-28: qty 50

## 2015-10-28 MED ORDER — CIPROFLOXACIN IN D5W 200 MG/100ML IV SOLN
200.0000 mg | Freq: Two times a day (BID) | INTRAVENOUS | Status: DC
  Administered 2015-10-28 – 2015-10-30 (×5): 200 mg via INTRAVENOUS
  Filled 2015-10-28 (×6): qty 100

## 2015-10-28 MED ORDER — PANTOPRAZOLE SODIUM 40 MG IV SOLR
40.0000 mg | INTRAVENOUS | Status: DC
  Administered 2015-10-28 – 2015-10-31 (×4): 40 mg via INTRAVENOUS
  Filled 2015-10-28 (×4): qty 40

## 2015-10-28 MED ORDER — FAT EMULSION 20 % IV EMUL
240.0000 mL | INTRAVENOUS | Status: AC
  Administered 2015-10-28: 240 mL via INTRAVENOUS
  Filled 2015-10-28: qty 250

## 2015-10-28 MED ORDER — KCL IN DEXTROSE-NACL 20-5-0.45 MEQ/L-%-% IV SOLN
INTRAVENOUS | Status: DC
  Administered 2015-10-29: 1 mL via INTRAVENOUS
  Filled 2015-10-28 (×2): qty 1000

## 2015-10-28 NOTE — Progress Notes (Signed)
Patient ID: Caroline Lewis, female   DOB: Nov 29, 1923, 81 y.o.   MRN: [id number]     [facility]      [address] [place], [place] [address]    Phone: [phone] FAX: [phone]     Subjective: Intermittent pain overnight, alleviated with morphine.  11ml bilious output.  Passing flatus.  BM recorded.   Objective:  Vital signs:  Filed Vitals:   10/27/15 0052 10/27/15 0554 10/27/15 2227 10/28/15 0651  BP: 124/76 121/63 123/73 107/63  Pulse:  89 99 99  Temp:  97.8 F (36.6 C) 98.9 F (37.2 C) 97.8 F (36.6 C)  TempSrc:  Oral Oral Oral  Resp:  $Remo'17 16 16  'SoVgr$ Height:      Weight:      SpO2:  93% 92% 92%    Last BM Date:  (no results from enema)  Intake/Output   Yesterday:  12/02 0701 - 12/03 0700 In: 2378.9 [I.V.:368.8; IV Piggyback:600; TPN:1410.2] Out: 150 [Emesis/NG output:150] This shift:  Total I/O In: -  Out: 50 [Urine:50]   Physical Exam: General: Pt awake/alert/oriented x4 in no acute distress  Abdomen: +BS, abdomen is distended, non tender.     Problem List:   Active Problems:   Small bowel obstruction ([facility])   Large bowel obstruction ([place])   Cachexia ([place])    Results:   Labs: Results for orders placed or performed during the hospital encounter of 10/23/2015 (from the past 48 hour(s))  CEA     Status: None   Collection Time: 10/26/15  9:50 AM  Result Value Ref Range   CEA 3.0 0.0 - 4.7 ng/mL    Comment: (NOTE)       Roche ECLIA methodology       Nonsmokers  <3.9                                     Smokers     <5.6 Performed At: [facility] [place], [place] [id number] Lindon Romp MD [id number]   Prealbumin     Status: Abnormal   Collection Time: 10/26/15  9:50 AM  Result Value Ref Range   Prealbumin 5.6 (L) 18 - 38 mg/dL  Magnesium     Status: Abnormal   Collection Time: 10/26/15 12:10 PM  Result Value Ref Range   Magnesium 1.6 (L) 1.7 - 2.4 mg/dL   Phosphorus     Status: None   Collection Time: 10/26/15 12:10 PM  Result Value Ref Range   Phosphorus 3.1 2.5 - 4.6 mg/dL  Prealbumin     Status: Abnormal   Collection Time: 10/26/15 12:10 PM  Result Value Ref Range   Prealbumin 5.3 (L) 18 - 38 mg/dL  Troponin I     Status: Abnormal   Collection Time: 10/26/15  4:00 PM  Result Value Ref Range   Troponin I 0.05 (H) <0.031 ng/mL    Comment:        PERSISTENTLY INCREASED TROPONIN VALUES IN THE RANGE OF 0.04-0.49 ng/mL CAN BE SEEN IN:       -UNSTABLE ANGINA       -CONGESTIVE HEART FAILURE       -MYOCARDITIS       -CHEST TRAUMA       -ARRYHTHMIAS       -LATE PRESENTING MYOCARDIAL INFARCTION       -COPD   CLINICAL FOLLOW-UP  RECOMMENDED.   Protime-INR     Status: Abnormal   Collection Time: 10/27/15  5:00 AM  Result Value Ref Range   Prothrombin Time 19.0 (H) 11.6 - 15.2 seconds   INR 1.59 (H) 0.00 - 1.49  Comprehensive metabolic panel     Status: Abnormal   Collection Time: 10/27/15  5:00 AM  Result Value Ref Range   Sodium 133 (L) 135 - 145 mmol/L   Potassium 3.6 3.5 - 5.1 mmol/L   Chloride 100 (L) 101 - 111 mmol/L   CO2 26 22 - 32 mmol/L   Glucose, Bld 177 (H) 65 - 99 mg/dL   BUN 10 6 - 20 mg/dL   Creatinine, Ser 0.53 0.44 - 1.00 mg/dL   Calcium 7.5 (L) 8.9 - 10.3 mg/dL   Total Protein 4.6 (L) 6.5 - 8.1 g/dL   Albumin 1.9 (L) 3.5 - 5.0 g/dL   AST 20 15 - 41 U/L   ALT 10 (L) 14 - 54 U/L   Alkaline Phosphatase 67 38 - 126 U/L   Total Bilirubin 0.4 0.3 - 1.2 mg/dL   GFR calc non Af Amer >60 >60 mL/min   GFR calc Af Amer >60 >60 mL/min    Comment: (NOTE) The eGFR has been calculated using the CKD EPI equation. This calculation has not been validated in all clinical situations. eGFR's persistently <60 mL/min signify possible Chronic Kidney Disease.    Anion gap 7 5 - 15  Magnesium     Status: Abnormal   Collection Time: 10/27/15  5:00 AM  Result Value Ref Range   Magnesium 1.6 (L) 1.7 - 2.4 mg/dL  Phosphorus      Status: None   Collection Time: 10/27/15  5:00 AM  Result Value Ref Range   Phosphorus 2.5 2.5 - 4.6 mg/dL  Triglycerides     Status: None   Collection Time: 10/27/15  5:00 AM  Result Value Ref Range   Triglycerides 61 <150 mg/dL  CBC     Status: Abnormal   Collection Time: 10/27/15  5:00 AM  Result Value Ref Range   WBC 19.1 (H) 4.0 - 10.5 K/uL   RBC 3.78 (L) 3.87 - 5.11 MIL/uL   Hemoglobin 9.7 (L) 12.0 - 15.0 g/dL   HCT 31.6 (L) 36.0 - 46.0 %   MCV 83.6 78.0 - 100.0 fL   MCH 25.7 (L) 26.0 - 34.0 pg   MCHC 30.7 30.0 - 36.0 g/dL   RDW 26.4 (H) 11.5 - 15.5 %   Platelets 244 150 - 400 K/uL  Differential     Status: Abnormal   Collection Time: 10/27/15  5:00 AM  Result Value Ref Range   Neutrophils Relative % 90 %   Lymphocytes Relative 5 %   Monocytes Relative 5 %   Eosinophils Relative 0 %   Basophils Relative 0 %   Neutro Abs 17.1 (H) 1.7 - 7.7 K/uL   Lymphs Abs 1.0 0.7 - 4.0 K/uL   Monocytes Absolute 1.0 0.1 - 1.0 K/uL   Eosinophils Absolute 0.0 0.0 - 0.7 K/uL   Basophils Absolute 0.0 0.0 - 0.1 K/uL   RBC Morphology POLYCHROMASIA PRESENT     Comment: ELLIPTOCYTES  Protime-INR     Status: Abnormal   Collection Time: 10/28/15  5:36 AM  Result Value Ref Range   Prothrombin Time 16.5 (H) 11.6 - 15.2 seconds   INR 1.32 0.00 - 1.49    Imaging / Studies: Dg Abd 1 View  10/26/2015  CLINICAL DATA:  Nasogastric tube placed under fluoroscopy. Fluoro time 3 minutes and 42 seconds. EXAM: ABDOMEN - 1 VIEW COMPARISON:  CT, 10/09/2015 FINDINGS: Nasogastric tube passes below the diaphragm to curl within the mid to distal stomach, well positioned. IMPRESSION: Well-positioned nasogastric tube. Electronically Signed   By: Lajean Manes M.D.   On: 10/26/2015 14:54   Ir Fluoro Guide Cv Line Right  10/26/2015  CLINICAL DATA:  81 year old female with small bowel obstruction in need for durable central venous access for hydration and nutrition. EXAM: IR RIGHT FLOURO GUIDE CV LINE; IR  ULTRASOUND GUIDANCE VASC ACCESS RIGHT FLUOROSCOPY TIME:  2 minutes 16 seconds for a total of 8.4 mGy TECHNIQUE: The right arm was prepped with chlorhexidine, draped in the usual sterile fashion using maximum barrier technique (cap and mask, sterile gown, sterile gloves, large sterile sheet, hand hygiene and cutaneous antiseptic). Local anesthesia was attained by infiltration with 1% lidocaine. Ultrasound demonstrated patency of the right brachial vein, and this was documented with an image. Under real-time ultrasound guidance, this vein was accessed with a 21 gauge micropuncture needle and image documentation was performed. The needle was exchanged over a guidewire for a peel-away sheath through which a 6 [place] dual lumen power injectable PICC was advanced, and positioned with its tip at the lower SVC/right atrial junction. Fluoroscopy during the procedure and fluoro spot radiograph confirms appropriate catheter position. The catheter was flushed, secured to the skin with Prolene sutures, and covered with a sterile dressing. COMPLICATIONS: None.  The patient tolerated the procedure well. IMPRESSION: Successful placement of a right arm dual-lumen power injectable PICC with sonographic and fluoroscopic guidance. The catheter is ready for use. Signed, Criselda Peaches, MD [facility] Radiology Electronically Signed   By: Jacqulynn Cadet M.D.   On: 10/26/2015 17:40   Ir US Guide Vasc Access Right  10/26/2015  CLINICAL DATA:  81 year old female with small bowel obstruction in need for durable central venous access for hydration and nutrition. EXAM: IR RIGHT FLOURO GUIDE CV LINE; IR ULTRASOUND GUIDANCE VASC ACCESS RIGHT FLUOROSCOPY TIME:  2 minutes 16 seconds for a total of 8.4 mGy TECHNIQUE: The right arm was prepped with chlorhexidine, draped in the usual sterile fashion using maximum barrier technique (cap and mask, sterile gown, sterile gloves, large sterile  sheet, hand hygiene and cutaneous antiseptic). Local anesthesia was attained by infiltration with 1% lidocaine. Ultrasound demonstrated patency of the right brachial vein, and this was documented with an image. Under real-time ultrasound guidance, this vein was accessed with a 21 gauge micropuncture needle and image documentation was performed. The needle was exchanged over a guidewire for a peel-away sheath through which a 6 [place] dual lumen power injectable PICC was advanced, and positioned with its tip at the lower SVC/right atrial junction. Fluoroscopy during the procedure and fluoro spot radiograph confirms appropriate catheter position. The catheter was flushed, secured to the skin with Prolene sutures, and covered with a sterile dressing. COMPLICATIONS: None.  The patient tolerated the procedure well. IMPRESSION: Successful placement of a right arm dual-lumen power injectable PICC with sonographic and fluoroscopic guidance. The catheter is ready for use. Signed, Criselda Peaches, MD [facility] Radiology Electronically Signed   By: Jacqulynn Cadet M.D.   On: 10/26/2015 17:40   Dg Abd 2 Views  10/27/2015  CLINICAL DATA:  Large bowel obstruction EXAM: ABDOMEN - 2 VIEW COMPARISON:  CT 10/24/2015 FINDINGS: NG tube in the stomach with the tip near the pylorus.  Stomach decompressed. Improvement in dilated small bowel. Air-fluid levels are noted on the decubitus view. Much of the small bowel is fluid-filled on previous CT. No free air on the decubitus view. Contrast in the urinary bladder from recent CT. IMPRESSION: NG tube in the stomach. Improvement in bowel obstruction pattern. No free air. Electronically Signed   By: Franchot Gallo M.D.   On: 10/27/2015 08:43   Dg Addison Bailey G Tube Plc W/fl-no Rad  10/26/2015  CLINICAL DATA:  NASO G TUBE PLACEMENT WITH FLUORO Fluoroscopy was utilized by the requesting physician.  No radiographic interpretation.     Medications / Allergies:  Scheduled Meds: . antiseptic oral rinse  7 mL Mouth Rinse q12n4p  . chlorhexidine  15 mL Mouth Rinse BID  . ciprofloxacin  400 mg Intravenous Q12H  . enoxaparin (LOVENOX) injection  1 mg/kg Subcutaneous Q12H  . insulin aspart  0-9 Units Subcutaneous 6 times per day  . metronidazole  500 mg Intravenous Q8H  . sodium chloride  10-40 mL Intracatheter Q12H  . sodium phosphate  1 enema Rectal BID   Continuous Infusions: . dextrose 5 % and 0.45 % NaCl with KCl 20 mEq/L 75 mL/hr at 10/28/15 0013   PRN Meds:.acetaminophen **OR** acetaminophen, bisacodyl, docusate sodium, menthol-cetylpyridinium, morphine injection, promethazine, sodium chloride  Antibiotics: Anti-infectives    Start     Dose/Rate Route Frequency Ordered Stop   10/27/15 1500  ciprofloxacin (CIPRO) IVPB 400 mg     400 mg 200 mL/hr over 60 Minutes Intravenous Every 12 hours 10/27/15 1458     10/27/15 1500  metroNIDAZOLE (FLAGYL) IVPB 500 mg     500 mg 100 mL/hr over 60 Minutes Intravenous Every 8 hours 10/27/15 1458         Assessment/Plan Small bowel obstruction 2/2 right colon mass, probable neoplasm  -continue NPO, NGT decompression, check films in AM -had a negative colonoscopy 4 years ago.  -patient and family wish to proceed with colonoscopy further diagnosis -CEA 3 PCM-pre-albumin 5.3, replace PICC and resume TPN tonight DVT(Dx 08/27/15)-coumadin on hold, lovenox   Erby Pian, ANP-BC [facility] Pager [phone](7A-4:30P) For consults and floor pages call [phone](7A-4:30P)  10/28/2015 8:28 AM

## 2015-10-28 NOTE — Procedures (Signed)
Interventional Radiology Procedure Note  Procedure:  Right arm PICC line  Complications:  None  Estimated Blood Loss: < 10 mL  New PICC placed via right brachial vein.  36 cm DL Power PICC with tip at SVC-RA junction.  OK to use.  Venetia Night. Kathlene Cote, M.D Pager:  [phone]

## 2015-10-28 NOTE — Progress Notes (Signed)
Notified this AM @ 0430 that PICC had been pulled out. Evaluated site and placed vaseline gauze dsg. No bleeding noted. PICC noted to be intact @ 33cm long. RN notified to call MD to place order for new PICC per IR.

## 2015-10-28 NOTE — Progress Notes (Signed)
Family Medicine Teaching Service Daily Progress Note Intern Pager: [phone]  Patient name: Caroline Lewis Medical record number: [id number] Date of birth: 1924-06-18 Age: 81 y.o. Gender: female  Primary Care Provider: Zigmund Gottron, MD Consultants: Gen Surg, GI  Code Status: DNR  Pt Overview and Major Events to Date:  11/30: Admitted for SBO   Assessment and Plan: ZABRIA Lewis is a 81 y.o. female presenting with nausea, vomiting and intermittent sharp abdominal pain. PMH is significant for IBS, RA, constipation, GERD, PVD, postmenopausal bleeding, hiatal hernia, and recent DVT started on coumadin.   Small bowel obstruction: Seen on CT abdomen/pelvis. Also with colonic mass concerning for neoplasm as possible source of inflammation that could have caused obstruction. NG tube placed under fluoro.  KUB from 12/2 demonstrates improvement in bowel obstruction without free air. CEA WNL at 3.0.   - General surgery consult; appreciate recommendations - IR unable to do biopsy due to concerns for bowel perforation.  - GI signed off yesterday, re-consulted this AM for colonoscopy: will clamp NGT q4hrs, give 8oz MiraLax and leave NGT clamped x 1 hour, repeat this until tomorrow AM when Dr. Jason Coop will see the patient.   - NPO. IVFs at 90 ml/hr.  - Continue NGT for decompression (with above clamping) - Fleets enema BID, consider discontinuing this.  - Tylenol prn for pain.  - Monitor I/Os - holding off on palliative care consult.   SIRS: Patient continues to have a  leukocytosis. She is afebrile and appears nontoxic with normal lactic acid. No source of infection noted, possibly GI related? - Monitor for fever; has been afebrile since admission  - Day 2 of cipro/flagyl  Chest pain: Low suspicion for ACS given normal EKG, but troponin of 0.04 in ED, troponins stable. Suspect GERD given patient's history.  - IV protonix while NPO.   Constipation: Chronic, has not had a bowel  movement in 2 weeks, per family but small BMs with enemas. - ducolax suppository ordered prn - MiraLax 8oz per GI as above.   Recent Left DVT: INR 1.59 (12/2)  -Lovenox at treatment dose, holding home coumadin   Dysphagia: Thought to be due to candidal esophagitis at last admission.  - Ordered bedside SLP consult once eating.   Anemia of chronic disease: HgB 9.7 (12.0) Improved from 8.0 a month ago when patient had hematemesis. During last admission, anemia panel significant for borderline low folate at 5.9, iron, ferretin and TIBC. - Holding home ferrous sulfate and folic acid as NPO.  - Consider IV iron due to her propensity towards constipation.  RA: Patient has decreased mobility 2/2 RA and gets around at home with an electric scooter.  - Patient takes weekly methotrexate 20 mg on Fridays. Will hold off on ordering as patient is still NPO.   LE Edema: Improved with treatment of DVT. HFpEF, with normal EF and grade 2 diastolic dysfunction on ECHO 11/28.  - Continue to monitor.   Protein calorie malnutrition: With excessive muscular atrophy and ongoing weight loss possibly related to neoplasm and/or inanition. Chronic constipation now with SBO likely contributing to nausea and feeling of fullness. Likely need to r/o neoplasm with colonoscopy.  - Patient has lost nearly 7 lbs since last office visit 09/28/15.  - Nutrition consult prior to discharge -TPN started on 12/1   Cough: Recently treated as HCAP with levofloxacin. Completed course.  - Patient saturating 92-96% on room air. - No evidence of PNA on CXR.   FEN/GI: NPO. D51/2NS @ 90  ml/hr while NPO. TPN Prophylaxis: Lovenox   Disposition: Pending management of SBO and colonic mass   Subjective:  Patient had a small BM with enema yet. Resting comfortably in bed.  Feeling much better. No abdominal pain. Family and patient would like colonoscopy, frustrated that people continue to ask her (however GI note from yesterday seems  to imply that this was declined).   Objective: Temp:  [97.8 F (36.6 C)-98.9 F (37.2 C)] 97.8 F (36.6 C) (12/03 0651) Pulse Rate:  [99] 99 (12/03 0651) Resp:  [16] 16 (12/03 0651) BP: (107-123)/(63-73) 107/63 mmHg (12/03 0651) SpO2:  [92 %] 92 % (12/03 0651) Physical Exam: General: cachetic elderly female resting in bed in NAD  Cardiovascular: RRR Lungs: CTAB without wheezing, rhonchi, or crackles.  Abdomen: Hypoactive bowel sounds, distended, soft, no TTP, no masses appreciated, no guarding or rebound tenderness  MSK: 3+ pitting edema of the dorsum of the L foot, 2+ on the right.    Laboratory:  Recent Labs Lab 10/11/2015 1925 10/26/15 0740 10/27/15 0500  WBC 19.1* 17.5* 19.1*  HGB 12.0 10.0* 9.7*  HCT 39.5 32.9* 31.6*  PLT 273 220 244    Recent Labs Lab 10/05/2015 1925 10/26/15 0740 10/27/15 0500  NA 135 135 133*  K 4.3 3.8 3.6  CL 101 104 100*  CO2 23 22 26   BUN 12 11 10   CREATININE 0.64 0.48 0.53  CALCIUM 8.3* 7.5* 7.5*  PROT 6.5  --  4.6*  BILITOT 0.7  --  0.4  ALKPHOS 91  --  67  ALT 14  --  10*  AST 21  --  20  GLUCOSE 154* 115* 177*     Imaging/Diagnostic Tests: Dg Chest 2 View  10/16/2015  CLINICAL DATA:  Epigastric pain.  nausea and vomiting. EXAM: CHEST  2 VIEW COMPARISON:  09/20/2015 FINDINGS: Chronic interstitial lung disease again seen with bibasilar predominance. No evidence of acute infiltrate or pleural effusion. Heart size remains at the upper limits of normal and stable. IMPRESSION: Chronic basilar predominant interstitial lung disease. No acute findings. Electronically Signed   By: Earle Gell M.D.   On: 10/17/2015 20:30   Dg Abd 1 View  10/26/2015  CLINICAL DATA:  Nasogastric tube placed under fluoroscopy. Fluoro time 3 minutes and 42 seconds. EXAM: ABDOMEN - 1 VIEW COMPARISON:  CT, 10/20/2015 FINDINGS: Nasogastric tube passes below the diaphragm to curl within the mid to distal stomach, well positioned. IMPRESSION: Well-positioned  nasogastric tube. Electronically Signed   By: Lajean Manes M.D.   On: 10/26/2015 14:54   Ct Abdomen Pelvis W Contrast  10/20/2015  CLINICAL DATA:  Generalized sharp abdominal pain. Nausea and vomiting. EXAM: CT ABDOMEN AND PELVIS WITH CONTRAST TECHNIQUE: Multidetector CT imaging of the abdomen and pelvis was performed using the standard protocol following bolus administration of intravenous contrast. CONTRAST:  79mL OMNIPAQUE IOHEXOL 300 MG/ML  SOLN COMPARISON:  07/22/2015 FINDINGS: Lower chest:  Bibasilar fibrosis.  Normal heart size. Hepatobiliary: Normal liver. Mild intrahepatic biliary ductal dilatation. Normal gallbladder. Pancreas: Normal. Spleen: Normal. Adrenals/Urinary Tract: Normal adrenal glands. No solid renal mass. No obstructive uropathy. No urolithiasis. Decompressed bladder. Stomach/Bowel: Moderate-sized hiatal hernia. Severe small bowel dilatation measuring up to 3.7 cm. The small bowel is diffusely dilated. There is dilatation of the ascending colon with an air-fluid level with a relative pointer transition in the mid ascending colon where there is a mass slight soft tissue prominence. No pneumatosis, pneumoperitoneum or portal venous gas. Vascular/Lymphatic: Normal caliber abdominal aorta with atherosclerosis.  Other: No fluid collection or hematoma. Musculoskeletal: Right total hip arthroplasty with resulting beam hardening artifact obscuring portions of the pelvis. Three cannulated screws transfixing the femoral neck. No acute osseous abnormality. No lytic or sclerotic osseous lesion. S-shaped scoliosis of the thoracolumbar spine. Severe degenerative disc disease throughout the thoracolumbar spine. Chronic T8, T9, T11, T12 and L1 vertebral body compression fractures. IMPRESSION: 1. High-grade small bowel obstruction with a transition point in the mid ascending colon. Masslike prominence in the mid ascending colon concerning for neoplasm. Recommend further evaluation with colonoscopy.  Electronically Signed   By: Kathreen Devoid   On: 09/28/2015 23:55   Ir Fluoro Guide Cv Line Right  10/26/2015  CLINICAL DATA:  80 year old female with small bowel obstruction in need for durable central venous access for hydration and nutrition. EXAM: IR RIGHT FLOURO GUIDE CV LINE; IR ULTRASOUND GUIDANCE VASC ACCESS RIGHT FLUOROSCOPY TIME:  2 minutes 16 seconds for a total of 8.4 mGy TECHNIQUE: The right arm was prepped with chlorhexidine, draped in the usual sterile fashion using maximum barrier technique (cap and mask, sterile gown, sterile gloves, large sterile sheet, hand hygiene and cutaneous antiseptic). Local anesthesia was attained by infiltration with 1% lidocaine. Ultrasound demonstrated patency of the right brachial vein, and this was documented with an image. Under real-time ultrasound guidance, this vein was accessed with a 21 gauge micropuncture needle and image documentation was performed. The needle was exchanged over a guidewire for a peel-away sheath through which a 6 [place] dual lumen power injectable PICC was advanced, and positioned with its tip at the lower SVC/right atrial junction. Fluoroscopy during the procedure and fluoro spot radiograph confirms appropriate catheter position. The catheter was flushed, secured to the skin with Prolene sutures, and covered with a sterile dressing. COMPLICATIONS: None.  The patient tolerated the procedure well. IMPRESSION: Successful placement of a right arm dual-lumen power injectable PICC with sonographic and fluoroscopic guidance. The catheter is ready for use. Signed, Criselda Peaches, MD [facility] Radiology Electronically Signed   By: Jacqulynn Cadet M.D.   On: 10/26/2015 17:40   Ir US Guide Vasc Access Right  10/26/2015  CLINICAL DATA:  81 year old female with small bowel obstruction in need for durable central venous access for hydration and nutrition. EXAM: IR RIGHT FLOURO GUIDE CV LINE; IR  ULTRASOUND GUIDANCE VASC ACCESS RIGHT FLUOROSCOPY TIME:  2 minutes 16 seconds for a total of 8.4 mGy TECHNIQUE: The right arm was prepped with chlorhexidine, draped in the usual sterile fashion using maximum barrier technique (cap and mask, sterile gown, sterile gloves, large sterile sheet, hand hygiene and cutaneous antiseptic). Local anesthesia was attained by infiltration with 1% lidocaine. Ultrasound demonstrated patency of the right brachial vein, and this was documented with an image. Under real-time ultrasound guidance, this vein was accessed with a 21 gauge micropuncture needle and image documentation was performed. The needle was exchanged over a guidewire for a peel-away sheath through which a 6 [place] dual lumen power injectable PICC was advanced, and positioned with its tip at the lower SVC/right atrial junction. Fluoroscopy during the procedure and fluoro spot radiograph confirms appropriate catheter position. The catheter was flushed, secured to the skin with Prolene sutures, and covered with a sterile dressing. COMPLICATIONS: None.  The patient tolerated the procedure well. IMPRESSION: Successful placement of a right arm dual-lumen power injectable PICC with sonographic and fluoroscopic guidance. The catheter is ready for use. Signed, Criselda Peaches, MD [facility] Radiology  Electronically Signed   By: Jacqulynn Cadet M.D.   On: 10/26/2015 17:40   Dg Abd 2 Views  10/27/2015  CLINICAL DATA:  Large bowel obstruction EXAM: ABDOMEN - 2 VIEW COMPARISON:  CT 10/04/2015 FINDINGS: NG tube in the stomach with the tip near the pylorus. Stomach decompressed. Improvement in dilated small bowel. Air-fluid levels are noted on the decubitus view. Much of the small bowel is fluid-filled on previous CT. No free air on the decubitus view. Contrast in the urinary bladder from recent CT. IMPRESSION: NG tube in the stomach. Improvement in bowel obstruction  pattern. No free air. Electronically Signed   By: Franchot Gallo M.D.   On: 10/27/2015 08:43   Dg Addison Bailey G Tube Plc W/fl-no Rad  10/26/2015  CLINICAL DATA:  NASO G TUBE PLACEMENT WITH FLUORO Fluoroscopy was utilized by the requesting physician.  No radiographic interpretation.    Archie Patten, MD 10/28/2015, 8:28 AM PGY-2, [place] Intern pager: [phone], text pages welcome

## 2015-10-28 NOTE — Progress Notes (Signed)
PARENTERAL NUTRITION CONSULT NOTE - Follow Up  Pharmacy Consult for TPN Indication: Small bowel obstruction  Allergies  Allergen Reactions  . Fosamax [Alendronate Sodium] Other (See Comments)    It affected my IBS   Patient Measurements: Height: 5' (152.4 cm) Weight: 98 lb 1.7 oz (44.5 kg) IBW/kg (Calculated) : 45.5  Vital Signs: Temp: 97.8 F (36.6 C) (12/03 0651) Temp Source: Oral (12/03 0651) BP: 107/63 mmHg (12/03 0651) Pulse Rate: 99 (12/03 0651) Intake/Output from previous day: 12/02 0701 - 12/03 0700 In: 2378.9 [I.V.:368.8; IV Piggyback:600; TPN:1410.2] Out: 150 [Emesis/NG output:150]  Labs:  Recent Labs  10/09/2015 1925 10/26/15 0740 10/27/15 0500 10/28/15 0536  WBC 19.1* 17.5* 19.1*  --   HGB 12.0 10.0* 9.7*  --   HCT 39.5 32.9* 31.6*  --   PLT 273 220 244  --   INR  --  1.41 1.59* 1.32    Recent Labs  10/11/2015 1925 10/26/15 0740 10/26/15 0950 10/26/15 1210 10/27/15 0500  NA 135 135  --   --  133*  K 4.3 3.8  --   --  3.6  CL 101 104  --   --  100*  CO2 23 22  --   --  26  GLUCOSE 154* 115*  --   --  177*  BUN 12 11  --   --  10  CREATININE 0.64 0.48  --   --  0.53  CALCIUM 8.3* 7.5*  --   --  7.5*  MG  --   --   --  1.6* 1.6*  PHOS  --   --   --  3.1 2.5  PROT 6.5  --   --   --  4.6*  ALBUMIN 2.7*  --   --   --  1.9*  AST 21  --   --   --  20  ALT 14  --   --   --  10*  ALKPHOS 91  --   --   --  67  BILITOT 0.7  --   --   --  0.4  PREALBUMIN  --   --  5.6* 5.3*  --   TRIG  --   --   --   --  61   Estimated Creatinine Clearance: 33.5 mL/min (by C-G formula based on Cr of 0.53).   Insulin Requirements in the past 24 hours:  None - CBG's 115-177  Current Nutrition:  Reports good appetite PTA, but unable to eat much food, weight loss, decreased albumin and total protein.  She is malnourished and cachetic.  Triglycerides = 61  D5-1/2NS w/59meq kcl @ 75 ml/hr  Assessment: 81 year old female c/o N/V, constipation, sharp abdominal pain.  States she has not had BM in ~ 2 weeks, has had 7 lb weight loss in one month. Surgery was consulted, CT shows a nearly completely obstructed R colonic mass.  Surgeries/Procedures: Pt is to have colonoscopy and partial colectomy  GI: Albumin 2.7>>1.9, recent weight loss, see assessment above, no BM in 2 weeks Endo: glucose up 177 - no hx. of EM Lytes: Corrected calcium = 9.18 mg/dL  Mild hypokalemia and hypomagnesemia(1.6) present - may reflect some re-feeding syndrome with the start of TPN and the degree of malnutrition. Supplement magnesium today Renal: SCr 0.5 stable, D5-1/2NS with 21meq/kcl at 18ml/hr Pulm: RA Cards: VSS   Hepatobil: LFTs/Tbili wnl Neuro: A&O AC/Heme: On warfarin PTA for DVT in Oct 2016, INR subtherapeutic on admission, now on  Lovenox ID:  WBC 19.2, LA wnl, afebrile, on Metronidazole and Cipro Best Practices: Lovenox TPN Access: PICC to be placed today TPN start date: 12/1 >12/3 (pt. Pulled PICC out) replacing today 12/3  Nutritional Goals:  ~1350 kcal (1300-1500 per RD) 70-90 g AA (55-70 g per RD)  Plan:  - Restart Clinimix E 5/15 at 100ml/hr and 20% IVFA @ 10 ml/hr to provide ~ 1400 kcal and 66 g protein.   -Add MVI and TE to TPN -Monitor lytes and glucose - add sliding scale coverage -F/u RD assessment  -Decrease IVF with rate change this evening to 77ml/hr -Replete magnesium with 2gm IV bolus x 1 - f/u labs  Rober Minion, PharmD., MS Clinical Pharmacist Pager:  [phone] Thank you for allowing pharmacy to be part of this patients care team. 10/28/2015,9:43 AM

## 2015-10-28 NOTE — Progress Notes (Signed)
The IV  Team said they don't have a nurse to put the PICC line today , I paged Dr. Verita Lamb if he can order IR to put the PICC line because IR did it the first time, still waiting for return call, endorsed to am nurse.

## 2015-10-29 ENCOUNTER — Inpatient Hospital Stay (HOSPITAL_COMMUNITY): Payer: Commercial Managed Care - HMO

## 2015-10-29 DIAGNOSIS — K56609 Unspecified intestinal obstruction, unspecified as to partial versus complete obstruction: Secondary | ICD-10-CM | POA: Insufficient documentation

## 2015-10-29 LAB — GLUCOSE, CAPILLARY
GLUCOSE-CAPILLARY: 134 mg/dL — AB (ref 65–99)
GLUCOSE-CAPILLARY: 136 mg/dL — AB (ref 65–99)
Glucose-Capillary: 161 mg/dL — ABNORMAL HIGH (ref 65–99)
Glucose-Capillary: 162 mg/dL — ABNORMAL HIGH (ref 65–99)
Glucose-Capillary: 164 mg/dL — ABNORMAL HIGH (ref 65–99)
Glucose-Capillary: 169 mg/dL — ABNORMAL HIGH (ref 65–99)

## 2015-10-29 LAB — CBC
HEMATOCRIT: 27.9 % — AB (ref 36.0–46.0)
HEMOGLOBIN: 9.1 g/dL — AB (ref 12.0–15.0)
MCH: 26.8 pg (ref 26.0–34.0)
MCHC: 32.6 g/dL (ref 30.0–36.0)
MCV: 82.1 fL (ref 78.0–100.0)
Platelets: 260 10*3/uL (ref 150–400)
RBC: 3.4 MIL/uL — AB (ref 3.87–5.11)
RDW: 25.2 % — ABNORMAL HIGH (ref 11.5–15.5)
WBC: 19.1 10*3/uL — AB (ref 4.0–10.5)

## 2015-10-29 LAB — COMPREHENSIVE METABOLIC PANEL
ALK PHOS: 67 U/L (ref 38–126)
ALT: 9 U/L — AB (ref 14–54)
AST: 16 U/L (ref 15–41)
Albumin: 1.7 g/dL — ABNORMAL LOW (ref 3.5–5.0)
Anion gap: 4 — ABNORMAL LOW (ref 5–15)
BILIRUBIN TOTAL: 0.5 mg/dL (ref 0.3–1.2)
BUN: 13 mg/dL (ref 6–20)
CO2: 24 mmol/L (ref 22–32)
CREATININE: 0.46 mg/dL (ref 0.44–1.00)
Calcium: 7.3 mg/dL — ABNORMAL LOW (ref 8.9–10.3)
Chloride: 98 mmol/L — ABNORMAL LOW (ref 101–111)
GFR calc Af Amer: >60 mL/min (ref >60)
Glucose, Bld: 165 mg/dL — ABNORMAL HIGH (ref 65–99)
Potassium: 3.9 mmol/L (ref 3.5–5.1)
Sodium: 126 mmol/L — ABNORMAL LOW (ref 135–145)
TOTAL PROTEIN: 4.7 g/dL — AB (ref 6.5–8.1)

## 2015-10-29 LAB — PROTIME-INR
INR: 1.31 (ref 0.00–1.49)
INR: 1.33 (ref 0.00–1.49)
PROTHROMBIN TIME: 16.6 s — AB (ref 11.6–15.2)
Prothrombin Time: 16.4 s — ABNORMAL HIGH (ref 11.6–15.2)

## 2015-10-29 MED ORDER — SODIUM CHLORIDE 0.9 % IV SOLN: INTRAVENOUS | Status: DC

## 2015-10-29 MED ORDER — BENZONATATE 100 MG PO CAPS
200.0000 mg | ORAL_CAPSULE | Freq: Two times a day (BID) | ORAL | Status: DC | PRN
  Filled 2015-10-29: qty 2

## 2015-10-29 MED ORDER — GUAIFENESIN 100 MG/5ML PO SOLN
5.0000 mL | ORAL | Status: DC | PRN
  Administered 2015-10-29: 100 mg via ORAL
  Filled 2015-10-29: qty 5

## 2015-10-29 MED ORDER — BENZONATATE 100 MG PO CAPS: 200.0000 mg | ORAL_CAPSULE | Freq: Two times a day (BID) | ORAL | Status: DC

## 2015-10-29 MED ORDER — FAT EMULSION 20 % IV EMUL
240.0000 mL | INTRAVENOUS | Status: AC
  Administered 2015-10-29: 240 mL via INTRAVENOUS
  Filled 2015-10-29: qty 250

## 2015-10-29 MED ORDER — PEG 3350-KCL-NA BICARB-NACL 420 G PO SOLR
4000.0000 mL | Freq: Once | ORAL | Status: AC
  Administered 2015-10-29: 4000 mL via ORAL
  Filled 2015-10-29: qty 4000

## 2015-10-29 MED ORDER — TRACE MINERALS CR-CU-MN-SE-ZN 10-1000-500-60 MCG/ML IV SOLN
INTRAVENOUS | Status: AC
  Administered 2015-10-29: 17:00:00 via INTRAVENOUS
  Filled 2015-10-29: qty 1320

## 2015-10-29 NOTE — Progress Notes (Signed)
Family Medicine Teaching Service Daily Progress Note Intern Pager: [phone]  Patient name: Caroline Lewis Medical record number: [id number] Date of birth: 09/09/1924 Age: 81 y.o. Gender: female  Primary Care Provider: Zigmund Gottron, MD Consultants: Gen Surg, GI  Code Status: DNR  Pt Overview and Major Events to Date:  11/30: Admitted for SBO   Assessment and Plan: SHUNTAY Lewis is a 81 y.o. female presenting with nausea, vomiting and intermittent sharp abdominal pain. PMH is significant for IBS, RA, constipation, GERD, PVD, postmenopausal bleeding, hiatal hernia, and recent DVT started on coumadin.   Small bowel obstruction: Seen on CT abdomen/pelvis. Also with colonic mass concerning for neoplasm as possible source of inflammation that could have caused obstruction. NG tube placed under fluoro.  KUB from 12/2 demonstrates improvement in bowel obstruction without free air. KUB from 12/4 does not demonstrate dilated bowel loops, but stool burden still appreciated on imaging. CEA WNL at 3.0.   - General surgery consult; appreciate recommendations - colonoscopy prep: clamp NGT q4hrs, give 8oz MiraLax and leave NGT clamped x 1 hour, GI to see this AM  - NPO. IVFs at 90 ml/hr.  - Continue NGT for decompression (with above clamping) - Fleets enema BID, consider milk of magnesia enema  - Tylenol prn for pain.  - Monitor I/Os - holding off on palliative care consult due to family wishes  -cipro/flagyl to treat for possible diverticulitis    SIRS: Patient continues to have a  leukocytosis. She is afebrile and appears nontoxic with normal lactic acid. No source of infection noted, possibly GI related? - Monitor for fever; has been afebrile since admission  - Day 3 of cipro/flagyl  Chest pain: Low suspicion for ACS given normal EKG, but troponin of 0.04 in ED, troponins stable. Suspect GERD given patient's history.  - IV protonix while NPO   Constipation: Chronic, has not had a  bowel movement in 2 weeks, per family but small BMs with enemas. - ducolax suppository ordered prn - MiraLax 8oz per GI as above.   Recent Left DVT: INR 1.59 (12/2)  -Lovenox at treatment dose, holding home coumadin   Dysphagia: Thought to be due to candidal esophagitis at last admission.  - Ordered bedside SLP consult once eating.   Anemia of chronic disease: HgB 9.7 (12.0) Improved from 8.0 a month ago when patient had hematemesis. During last admission, anemia panel significant for borderline low folate at 5.9, iron, ferretin and TIBC. - Holding home ferrous sulfate and folic acid as NPO.  - Consider IV iron due to her propensity towards constipation.  RA: Patient has decreased mobility 2/2 RA and gets around at home with an electric scooter.  - Patient takes weekly methotrexate 20 mg on Fridays. Will hold off on ordering as patient is still NPO.   LE Edema: Improved with treatment of DVT. HFpEF, with normal EF and grade 2 diastolic dysfunction on ECHO 11/28.  - Continue to monitor.   Protein calorie malnutrition: With excessive muscular atrophy and ongoing weight loss possibly related to neoplasm and/or inanition. Chronic constipation now with SBO likely contributing to nausea and feeling of fullness. Likely need to r/o neoplasm with colonoscopy.  - Patient has lost nearly 7 lbs since last office visit 09/28/15.  - Nutrition consult prior to discharge -TPN started on 12/1   Cough: Recently treated as HCAP with levofloxacin. Completed course.  - Patient saturating 92-96% on room air. - No evidence of PNA on CXR.   FEN/GI: NPO. D51/2NS @  90 ml/hr while NPO. TPN Prophylaxis: Lovenox   Disposition: Pending management of SBO and colonic mass   Subjective:  Patient has not had BM with fleet enema yet. No BMs overnight. Resting comfortably in bed.  Feeling much better. No abdominal pain.  Objective: Temp:  [97.8 F (36.6 C)-99 F (37.2 C)] 99 F (37.2 C) (12/04 0500) Pulse  Rate:  [99-111] 109 (12/04 0500) Resp:  [15-16] 15 (12/03 2058) BP: (107-125)/(63-78) 110/64 mmHg (12/04 0500) SpO2:  [92 %-94 %] 94 % (12/04 0500) Physical Exam: General: cachetic elderly female resting in bed in NAD  Cardiovascular: RRR Lungs: CTAB without wheezing, rhonchi, or crackles.  Abdomen: Hypoactive bowel sounds, distended, soft, no TTP, no masses appreciated, no guarding or rebound tenderness  MSK: 2+ pitting edema of the dorsum of the L foot, 1+ on the right.    Laboratory:  Recent Labs Lab 10/11/2015 1925 10/26/15 0740 10/27/15 0500  WBC 19.1* 17.5* 19.1*  HGB 12.0 10.0* 9.7*  HCT 39.5 32.9* 31.6*  PLT 273 220 244    Recent Labs Lab 10/17/2015 1925 10/26/15 0740 10/27/15 0500  NA 135 135 133*  K 4.3 3.8 3.6  CL 101 104 100*  CO2 23 22 26   BUN 12 11 10   CREATININE 0.64 0.48 0.53  CALCIUM 8.3* 7.5* 7.5*  PROT 6.5  --  4.6*  BILITOT 0.7  --  0.4  ALKPHOS 91  --  67  ALT 14  --  10*  AST 21  --  20  GLUCOSE 154* 115* 177*     Imaging/Diagnostic Tests: Dg Chest 2 View  10/01/2015  CLINICAL DATA:  Epigastric pain.  nausea and vomiting. EXAM: CHEST  2 VIEW COMPARISON:  09/20/2015 FINDINGS: Chronic interstitial lung disease again seen with bibasilar predominance. No evidence of acute infiltrate or pleural effusion. Heart size remains at the upper limits of normal and stable. IMPRESSION: Chronic basilar predominant interstitial lung disease. No acute findings. Electronically Signed   By: Earle Gell M.D.   On: 09/27/2015 20:30   Dg Abd 1 View  10/26/2015  CLINICAL DATA:  Nasogastric tube placed under fluoroscopy. Fluoro time 3 minutes and 42 seconds. EXAM: ABDOMEN - 1 VIEW COMPARISON:  CT, 10/23/2015 FINDINGS: Nasogastric tube passes below the diaphragm to curl within the mid to distal stomach, well positioned. IMPRESSION: Well-positioned nasogastric tube. Electronically Signed   By: Lajean Manes M.D.   On: 10/26/2015 14:54   Ct Abdomen Pelvis W  Contrast  10/18/2015  CLINICAL DATA:  Generalized sharp abdominal pain. Nausea and vomiting. EXAM: CT ABDOMEN AND PELVIS WITH CONTRAST TECHNIQUE: Multidetector CT imaging of the abdomen and pelvis was performed using the standard protocol following bolus administration of intravenous contrast. CONTRAST:  39mL OMNIPAQUE IOHEXOL 300 MG/ML  SOLN COMPARISON:  07/22/2015 FINDINGS: Lower chest:  Bibasilar fibrosis.  Normal heart size. Hepatobiliary: Normal liver. Mild intrahepatic biliary ductal dilatation. Normal gallbladder. Pancreas: Normal. Spleen: Normal. Adrenals/Urinary Tract: Normal adrenal glands. No solid renal mass. No obstructive uropathy. No urolithiasis. Decompressed bladder. Stomach/Bowel: Moderate-sized hiatal hernia. Severe small bowel dilatation measuring up to 3.7 cm. The small bowel is diffusely dilated. There is dilatation of the ascending colon with an air-fluid level with a relative pointer transition in the mid ascending colon where there is a mass slight soft tissue prominence. No pneumatosis, pneumoperitoneum or portal venous gas. Vascular/Lymphatic: Normal caliber abdominal aorta with atherosclerosis. Other: No fluid collection or hematoma. Musculoskeletal: Right total hip arthroplasty with resulting beam hardening artifact obscuring portions of the  pelvis. Three cannulated screws transfixing the femoral neck. No acute osseous abnormality. No lytic or sclerotic osseous lesion. S-shaped scoliosis of the thoracolumbar spine. Severe degenerative disc disease throughout the thoracolumbar spine. Chronic T8, T9, T11, T12 and L1 vertebral body compression fractures. IMPRESSION: 1. High-grade small bowel obstruction with a transition point in the mid ascending colon. Masslike prominence in the mid ascending colon concerning for neoplasm. Recommend further evaluation with colonoscopy. Electronically Signed   By: Kathreen Devoid   On: 10/22/2015 23:55   Ir Fluoro Guide Cv Line Right  10/28/2015   CLINICAL DATA:  Obstructing colonic mass and need for parental nutrition. A right upper extremity PICC line placed 2 days ago has been inadvertently pulled out entirely by accident. EXAM: POWER PICC LINE PLACEMENT WITH ULTRASOUND AND FLUOROSCOPIC GUIDANCE FLUOROSCOPY TIME:  1 minutes and 42 seconds. PROCEDURE: The patient's daughter was advised of the possible risks and complications and agreed to undergo the procedure. The patient was then brought to the angiographic suite for the procedure. A time-out was performed prior to the procedure. The right arm was prepped with chlorhexidine, draped in the usual sterile fashion using maximum barrier technique (cap and mask, sterile gown, sterile gloves, large sterile sheet, hand hygiene and cutaneous antisepsis) and infiltrated locally with 1% Lidocaine. Ultrasound demonstrated patency of the right brachial vein, and this was documented with an image. Under real-time ultrasound guidance, this vein was accessed with a 21 gauge micropuncture needle and image documentation was performed. A 0.018 wire was introduced in to the vein. Over this, a 5.0 [place] dual lumen power injectable PICC was advanced to the lower SVC/right atrial junction. Fluoroscopy during the procedure and fluoro spot radiograph confirms appropriate catheter position. The catheter was flushed and covered with a sterile dressing. Catheter length: 36 cm COMPLICATIONS: None IMPRESSION: Successful right arm power injectable PICC line placement with ultrasound and fluoroscopic guidance. The catheter is ready for use. Electronically Signed   By: Aletta Edouard M.D.   On: 10/28/2015 11:42   Ir Fluoro Guide Cv Line Right  10/26/2015  CLINICAL DATA:  81 year old female with small bowel obstruction in need for durable central venous access for hydration and nutrition. EXAM: IR RIGHT FLOURO GUIDE CV LINE; IR ULTRASOUND GUIDANCE VASC ACCESS RIGHT FLUOROSCOPY TIME:  2 minutes 16 seconds for a total of 8.4 mGy  TECHNIQUE: The right arm was prepped with chlorhexidine, draped in the usual sterile fashion using maximum barrier technique (cap and mask, sterile gown, sterile gloves, large sterile sheet, hand hygiene and cutaneous antiseptic). Local anesthesia was attained by infiltration with 1% lidocaine. Ultrasound demonstrated patency of the right brachial vein, and this was documented with an image. Under real-time ultrasound guidance, this vein was accessed with a 21 gauge micropuncture needle and image documentation was performed. The needle was exchanged over a guidewire for a peel-away sheath through which a 6 [place] dual lumen power injectable PICC was advanced, and positioned with its tip at the lower SVC/right atrial junction. Fluoroscopy during the procedure and fluoro spot radiograph confirms appropriate catheter position. The catheter was flushed, secured to the skin with Prolene sutures, and covered with a sterile dressing. COMPLICATIONS: None.  The patient tolerated the procedure well. IMPRESSION: Successful placement of a right arm dual-lumen power injectable PICC with sonographic and fluoroscopic guidance. The catheter is ready for use. Signed, Criselda Peaches, MD [facility] Radiology Electronically Signed   By: Jacqulynn Cadet M.D.   On: 10/26/2015 17:40  Ir US Guide Vasc Access Right  10/28/2015  CLINICAL DATA:  Obstructing colonic mass and need for parental nutrition. A right upper extremity PICC line placed 2 days ago has been inadvertently pulled out entirely by accident. EXAM: POWER PICC LINE PLACEMENT WITH ULTRASOUND AND FLUOROSCOPIC GUIDANCE FLUOROSCOPY TIME:  1 minutes and 42 seconds. PROCEDURE: The patient's daughter was advised of the possible risks and complications and agreed to undergo the procedure. The patient was then brought to the angiographic suite for the procedure. A time-out was performed prior to the procedure. The right arm  was prepped with chlorhexidine, draped in the usual sterile fashion using maximum barrier technique (cap and mask, sterile gown, sterile gloves, large sterile sheet, hand hygiene and cutaneous antisepsis) and infiltrated locally with 1% Lidocaine. Ultrasound demonstrated patency of the right brachial vein, and this was documented with an image. Under real-time ultrasound guidance, this vein was accessed with a 21 gauge micropuncture needle and image documentation was performed. A 0.018 wire was introduced in to the vein. Over this, a 5.0 [place] dual lumen power injectable PICC was advanced to the lower SVC/right atrial junction. Fluoroscopy during the procedure and fluoro spot radiograph confirms appropriate catheter position. The catheter was flushed and covered with a sterile dressing. Catheter length: 36 cm COMPLICATIONS: None IMPRESSION: Successful right arm power injectable PICC line placement with ultrasound and fluoroscopic guidance. The catheter is ready for use. Electronically Signed   By: Aletta Edouard M.D.   On: 10/28/2015 11:42   Ir US Guide Vasc Access Right  10/26/2015  CLINICAL DATA:  81 year old female with small bowel obstruction in need for durable central venous access for hydration and nutrition. EXAM: IR RIGHT FLOURO GUIDE CV LINE; IR ULTRASOUND GUIDANCE VASC ACCESS RIGHT FLUOROSCOPY TIME:  2 minutes 16 seconds for a total of 8.4 mGy TECHNIQUE: The right arm was prepped with chlorhexidine, draped in the usual sterile fashion using maximum barrier technique (cap and mask, sterile gown, sterile gloves, large sterile sheet, hand hygiene and cutaneous antiseptic). Local anesthesia was attained by infiltration with 1% lidocaine. Ultrasound demonstrated patency of the right brachial vein, and this was documented with an image. Under real-time ultrasound guidance, this vein was accessed with a 21 gauge micropuncture needle and image documentation was performed. The needle was exchanged over a  guidewire for a peel-away sheath through which a 6 [place] dual lumen power injectable PICC was advanced, and positioned with its tip at the lower SVC/right atrial junction. Fluoroscopy during the procedure and fluoro spot radiograph confirms appropriate catheter position. The catheter was flushed, secured to the skin with Prolene sutures, and covered with a sterile dressing. COMPLICATIONS: None.  The patient tolerated the procedure well. IMPRESSION: Successful placement of a right arm dual-lumen power injectable PICC with sonographic and fluoroscopic guidance. The catheter is ready for use. Signed, Criselda Peaches, MD [facility] Radiology Electronically Signed   By: Jacqulynn Cadet M.D.   On: 10/26/2015 17:40   Dg Abd 2 Views  10/27/2015  CLINICAL DATA:  Large bowel obstruction EXAM: ABDOMEN - 2 VIEW COMPARISON:  CT 09/29/2015 FINDINGS: NG tube in the stomach with the tip near the pylorus. Stomach decompressed. Improvement in dilated small bowel. Air-fluid levels are noted on the decubitus view. Much of the small bowel is fluid-filled on previous CT. No free air on the decubitus view. Contrast in the urinary bladder from recent CT. IMPRESSION: NG tube in the stomach. Improvement in bowel obstruction pattern. No free air.  Electronically Signed   By: Franchot Gallo M.D.   On: 10/27/2015 08:43   Dg Addison Bailey G Tube Plc W/fl-no Rad  10/26/2015  CLINICAL DATA:  NASO G TUBE PLACEMENT WITH FLUORO Fluoroscopy was utilized by the requesting physician.  No radiographic interpretation.    Nicolette Bang, DO 10/29/2015, 6:27 AM PGY-1, [place] Intern pager: [phone], text pages welcome

## 2015-10-29 NOTE — Progress Notes (Signed)
Pt was nauseated and feel tired, family requested to let her rest for a while given morphine, fleet enema, informed Dr. Paulita Fujita , gastroenterologist and ordered rescheduled colonoscopy, family at the bedside.

## 2015-10-29 NOTE — Progress Notes (Signed)
Patient ID: Caroline Lewis, female   DOB: Jun 07, 1924, 81 y.o.   MRN: [id number]     [facility]      [address] [place], [place] [id number]    Phone: [phone] FAX: [phone]     Subjective: Tolerating miralax prep.   denies pain.   Objective:  Vital signs:  Filed Vitals:   10/28/15 0651 10/28/15 1425 10/28/15 2058 10/29/15 0500  BP: 107/63 125/72 120/78 110/64  Pulse: 99 111 111 109  Temp: 97.8 F (36.6 C) 98.9 F (37.2 C) 99 F (37.2 C) 99 F (37.2 C)  TempSrc: Oral Oral Axillary Oral  Resp: 16 16 15    Height:      Weight:      SpO2: 92% 92% 94% 94%    Last BM Date: 10/28/15  Intake/Output   Yesterday:  12/03 0701 - 12/04 0700 In: 1849.3 [I.V.:439.3; NG/GT:420; IV Piggyback:200; TPN:790] Out: 375 [Urine:50; Emesis/NG output:325] This shift:    I/O last 3 completed shifts: In: 4228.2 [I.V.:808.1; NG/GT:420; IV Piggyback:800] Out: 525 [Urine:50; Emesis/NG output:475]    Physical Exam: General: Pt awake/alert/oriented x4 in no acute distress  Abdomen: +BS, abdomen is distended, but soft.  non tender.      Problem List:   Active Problems:   Small bowel obstruction (HCC)   Large bowel obstruction (HCC)   Cachexia ([place])    Results:   Labs: Results for orders placed or performed during the hospital encounter of 10/17/2015 (from the past 48 hour(s))  Protime-INR     Status: Abnormal   Collection Time: 10/28/15  5:36 AM  Result Value Ref Range   Prothrombin Time 16.5 (H) 11.6 - 15.2 seconds   INR 1.32 0.00 - 1.49  Glucose, capillary     Status: Abnormal   Collection Time: 10/28/15  8:30 PM  Result Value Ref Range   Glucose-Capillary 147 (H) 65 - 99 mg/dL   Comment 1 Notify RN    Comment 2 Document in Chart   Glucose, capillary     Status: Abnormal   Collection Time: 10/29/15 12:09 AM  Result Value Ref Range   Glucose-Capillary 162 (H) 65 - 99 mg/dL   Comment 1 Notify RN    Comment 2  Document in Chart   Glucose, capillary     Status: Abnormal   Collection Time: 10/29/15  4:15 AM  Result Value Ref Range   Glucose-Capillary 134 (H) 65 - 99 mg/dL   Comment 1 Notify RN    Comment 2 Document in Chart   Protime-INR     Status: Abnormal   Collection Time: 10/29/15  4:44 AM  Result Value Ref Range   Prothrombin Time 16.4 (H) 11.6 - 15.2 seconds   INR 1.31 0.00 - 1.49    Imaging / Studies: Ir Fluoro Guide Cv Line Right  10/28/2015  CLINICAL DATA:  Obstructing colonic mass and need for parental nutrition. A right upper extremity PICC line placed 2 days ago has been inadvertently pulled out entirely by accident. EXAM: POWER PICC LINE PLACEMENT WITH ULTRASOUND AND FLUOROSCOPIC GUIDANCE FLUOROSCOPY TIME:  1 minutes and 42 seconds. PROCEDURE: The patient's daughter was advised of the possible risks and complications and agreed to undergo the procedure. The patient was then brought to the angiographic suite for the procedure. A time-out was performed prior to the procedure. The right arm was prepped with chlorhexidine, draped in the usual sterile fashion using maximum barrier technique (cap and mask, sterile gown,  sterile gloves, large sterile sheet, hand hygiene and cutaneous antisepsis) and infiltrated locally with 1% Lidocaine. Ultrasound demonstrated patency of the right brachial vein, and this was documented with an image. Under real-time ultrasound guidance, this vein was accessed with a 21 gauge micropuncture needle and image documentation was performed. A 0.018 wire was introduced in to the vein. Over this, a 5.0 [place] dual lumen power injectable PICC was advanced to the lower SVC/right atrial junction. Fluoroscopy during the procedure and fluoro spot radiograph confirms appropriate catheter position. The catheter was flushed and covered with a sterile dressing. Catheter length: 36 cm COMPLICATIONS: None IMPRESSION: Successful right arm power injectable PICC line placement with  ultrasound and fluoroscopic guidance. The catheter is ready for use. Electronically Signed   By: Aletta Edouard M.D.   On: 10/28/2015 11:42   Ir US Guide Vasc Access Right  10/28/2015  CLINICAL DATA:  Obstructing colonic mass and need for parental nutrition. A right upper extremity PICC line placed 2 days ago has been inadvertently pulled out entirely by accident. EXAM: POWER PICC LINE PLACEMENT WITH ULTRASOUND AND FLUOROSCOPIC GUIDANCE FLUOROSCOPY TIME:  1 minutes and 42 seconds. PROCEDURE: The patient's daughter was advised of the possible risks and complications and agreed to undergo the procedure. The patient was then brought to the angiographic suite for the procedure. A time-out was performed prior to the procedure. The right arm was prepped with chlorhexidine, draped in the usual sterile fashion using maximum barrier technique (cap and mask, sterile gown, sterile gloves, large sterile sheet, hand hygiene and cutaneous antisepsis) and infiltrated locally with 1% Lidocaine. Ultrasound demonstrated patency of the right brachial vein, and this was documented with an image. Under real-time ultrasound guidance, this vein was accessed with a 21 gauge micropuncture needle and image documentation was performed. A 0.018 wire was introduced in to the vein. Over this, a 5.0 [place] dual lumen power injectable PICC was advanced to the lower SVC/right atrial junction. Fluoroscopy during the procedure and fluoro spot radiograph confirms appropriate catheter position. The catheter was flushed and covered with a sterile dressing. Catheter length: 36 cm COMPLICATIONS: None IMPRESSION: Successful right arm power injectable PICC line placement with ultrasound and fluoroscopic guidance. The catheter is ready for use. Electronically Signed   By: Aletta Edouard M.D.   On: 10/28/2015 11:42   Dg Abd 2 Views  10/27/2015  CLINICAL DATA:  Large bowel obstruction EXAM: ABDOMEN - 2 VIEW COMPARISON:  CT 10/21/2015 FINDINGS: NG tube  in the stomach with the tip near the pylorus. Stomach decompressed. Improvement in dilated small bowel. Air-fluid levels are noted on the decubitus view. Much of the small bowel is fluid-filled on previous CT. No free air on the decubitus view. Contrast in the urinary bladder from recent CT. IMPRESSION: NG tube in the stomach. Improvement in bowel obstruction pattern. No free air. Electronically Signed   By: Franchot Gallo M.D.   On: 10/27/2015 08:43    Medications / Allergies:  Scheduled Meds: . antiseptic oral rinse  7 mL Mouth Rinse q12n4p  . chlorhexidine  15 mL Mouth Rinse BID  . ciprofloxacin  200 mg Intravenous Q12H  . enoxaparin (LOVENOX) injection  1 mg/kg Subcutaneous Q12H  . insulin aspart  0-9 Units Subcutaneous 6 times per day  . metronidazole  500 mg Intravenous Q8H  . pantoprazole (PROTONIX) IV  40 mg Intravenous Q24H  . sodium chloride  10-40 mL Intracatheter Q12H  . sodium phosphate  1 enema Rectal BID   Continuous Infusions: .  dextrose 5 % and 0.45 % NaCl with KCl 20 mEq/L 40 mL/hr at 10/28/15 1740  . Marland KitchenTPN (CLINIMIX-E) Adult 55 mL/hr at 10/28/15 1807   And  . fat emulsion 240 mL (10/28/15 1807)  . Marland KitchenTPN (CLINIMIX-E) Adult     And  . fat emulsion     PRN Meds:.acetaminophen **OR** acetaminophen, bisacodyl, menthol-cetylpyridinium, morphine injection, promethazine, sodium chloride  Antibiotics: Anti-infectives    Start     Dose/Rate Route Frequency Ordered Stop   10/28/15 1500  ciprofloxacin (CIPRO) IVPB 200 mg     200 mg 100 mL/hr over 60 Minutes Intravenous Every 12 hours 10/28/15 1046     10/27/15 1500  ciprofloxacin (CIPRO) IVPB 400 mg  Status:  Discontinued     400 mg 200 mL/hr over 60 Minutes Intravenous Every 12 hours 10/27/15 1458 10/28/15 1045   10/27/15 1500  metroNIDAZOLE (FLAGYL) IVPB 500 mg     500 mg 100 mL/hr over 60 Minutes Intravenous Every 8 hours 10/27/15 1458          Assessment/Plan Small bowel obstruction 2/2 right colon mass, 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  -CEA 3 -prepping for colonoscopy  PCM-pre-albumin 5.3, TPN restarted DVT(Dx 08/27/15)-coumadin on hold, lovenox    Erby Pian, ANP-BC [facility]   10/29/2015 8:18 AM

## 2015-10-29 NOTE — Progress Notes (Signed)
PARENTERAL NUTRITION CONSULT NOTE - Follow Up  Pharmacy Consult for TPN Indication: Small bowel obstruction  Allergies  Allergen Reactions  . Fosamax [Alendronate Sodium] Other (See Comments)    It affected my IBS   Patient Measurements: Height: 5' (152.4 cm) Weight: 98 lb 1.7 oz (44.5 kg) IBW/kg (Calculated) : 45.5  Vital Signs: Temp: 99 F (37.2 C) (12/04 0500) Temp Source: Oral (12/04 0500) BP: 110/64 mmHg (12/04 0500) Pulse Rate: 109 (12/04 0500) Intake/Output from previous day: 12/03 0701 - 12/04 0700 In: 1849.3 [I.V.:439.3; NG/GT:420; IV Piggyback:200; TPN:790] Out: 375 [Urine:50; Emesis/NG output:325]  Labs:  Recent Labs  10/26/15 0740 10/27/15 0500 10/28/15 0536 10/29/15 0444  WBC 17.5* 19.1*  --   --   HGB 10.0* 9.7*  --   --   HCT 32.9* 31.6*  --   --   PLT 220 244  --   --   INR 1.41 1.59* 1.32 1.31    Recent Labs  10/26/15 0740 10/26/15 0950 10/26/15 1210 10/27/15 0500  NA 135  --   --  133*  K 3.8  --   --  3.6  CL 104  --   --  100*  CO2 22  --   --  26  GLUCOSE 115*  --   --  177*  BUN 11  --   --  10  CREATININE 0.48  --   --  0.53  CALCIUM 7.5*  --   --  7.5*  MG  --   --  1.6* 1.6*  PHOS  --   --  3.1 2.5  PROT  --   --   --  4.6*  ALBUMIN  --   --   --  1.9*  AST  --   --   --  20  ALT  --   --   --  10*  ALKPHOS  --   --   --  67  BILITOT  --   --   --  0.4  PREALBUMIN  --  5.6* 5.3*  --   TRIG  --   --   --  61   Estimated Creatinine Clearance: 33.5 mL/min (by C-G formula based on Cr of 0.53).   Insulin Requirements in the past 24 hours:  4 units SSI given - CBG's 134-147  Current Nutrition:  Reports good appetite PTA, but unable to eat much food, weight loss, decreased albumin and total protein.  She is malnourished and cachetic.  Triglycerides = 61  D5-1/2NS w/59meq kcl @ 40 ml/hr  Assessment: 81 year old female c/o N/V, constipation, sharp abdominal pain. States she has not had BM in ~ 2 weeks, has had 7 lb weight  loss in one month. Surgery was consulted, CT shows a nearly completely obstructed R colonic mass vs. Infection.  Surgeries/Procedures: Plan for either colonoscopy or IR guided biopsy   GI: Albumin 2.7>>1.9, recent weight loss, see assessment above, BM on 12/3.  Now thought to possibly have acute diverticulitis. Fleets enema bid on board - ?stop date  Endo: glucose up some - no hx. of EM Lytes: Corrected calcium = 9.18 mg/dL  No new labs today will f/u in AM.  Renal: SCr 0.5 stable, D5-1/2NS with 64meq/kcl at 26ml/hr Pulm: RA Cards: Tachy - 109-111 Hepatobil: LFTs/Tbili wnl Neuro: A&O AC/Heme: On warfarin PTA for DVT in Oct 2016, INR subtherapeutic on admission, now on Lovenox but still checking INR's - will discontinue. ID:  WBC 19.2, LA wnl,  afebrile, on Metronidazole and Cipro Best Practices: Lovenox TPN Access: PICC to be placed today TPN start date: 12/1 >12/3 (pt. Pulled PICC out) replacing today 12/3  Nutritional Goals:  ~1350 kcal (1300-1500 per RD) 70-90 g AA (55-70 g per RD)  Plan:  - Continue Clinimix E 5/15 at 64ml/hr and 20% IVFA @ 10 ml/hr to provide ~ 1400 kcal and 66 g protein.   -Add MVI and TE to TPN -Monitor lytes and glucose - add sliding scale coverage -F/u RD assessment  - f/u labs  Rober Minion, PharmD., MS Clinical Pharmacist Pager:  [phone] Thank you for allowing pharmacy to be part of this patients care team. 10/29/2015,7:29 AM

## 2015-10-29 NOTE — Progress Notes (Signed)
OT Cancellation Note  Patient Details Name: UROOJ SERENO MRN: [id number] DOB: 1924-02-26   Cancelled Treatment:    Reason Eval/Treat Not Completed:  (Pt reports she is not feeling up to it and is hungry.)  Benito Mccreedy OTR/L [id number] 10/29/2015, 3:45 PM

## 2015-10-29 NOTE — Progress Notes (Signed)
Caroline Lewis 12:04 PM  Subjective: Patient seen and examined and hospital computer chart reviewed and CT reviewed yesterday with radiology and discussed with the hospital team and today I had a long conversation with the patient and her family specifically about proceeding with the colonoscopy including the risks and the prep and she has no new complaints  Objective: Vital signs stable afebrile no acute distress elderly and frail abdomen is actually soft nontender occasional bowel sounds no new labs or x-ray improved  Assessment: Questionable colon mass  Plan: We will go ahead and prep her from the NG and if no change in condition attempt colonoscopy tomorrow  [facility]  Pager [phone] After 5PM or if no answer call [phone]

## 2015-10-30 ENCOUNTER — Inpatient Hospital Stay (HOSPITAL_COMMUNITY): Payer: Commercial Managed Care - HMO

## 2015-10-30 DIAGNOSIS — R059 Cough, unspecified: Secondary | ICD-10-CM | POA: Insufficient documentation

## 2015-10-30 DIAGNOSIS — R509 Fever, unspecified: Secondary | ICD-10-CM | POA: Insufficient documentation

## 2015-10-30 DIAGNOSIS — D72829 Elevated white blood cell count, unspecified: Secondary | ICD-10-CM | POA: Insufficient documentation

## 2015-10-30 DIAGNOSIS — R05 Cough: Secondary | ICD-10-CM | POA: Insufficient documentation

## 2015-10-30 LAB — GLUCOSE, CAPILLARY
GLUCOSE-CAPILLARY: 173 mg/dL — AB (ref 65–99)
GLUCOSE-CAPILLARY: 176 mg/dL — AB (ref 65–99)
GLUCOSE-CAPILLARY: 162 mg/dL — AB (ref 65–99)
Glucose-Capillary: 144 mg/dL — ABNORMAL HIGH (ref 65–99)
Glucose-Capillary: 153 mg/dL — ABNORMAL HIGH (ref 65–99)
Glucose-Capillary: 181 mg/dL — ABNORMAL HIGH (ref 65–99)

## 2015-10-30 LAB — DIFFERENTIAL
Basophils Absolute: 0 10*3/uL (ref 0.0–0.1)
Basophils Relative: 0 %
EOS PCT: 0 %
Eosinophils Absolute: 0 10*3/uL (ref 0.0–0.7)
Lymphocytes Relative: 3 %
Lymphs Abs: 0.6 10*3/uL — ABNORMAL LOW (ref 0.7–4.0)
MONO ABS: 1.2 10*3/uL — AB (ref 0.1–1.0)
MONOS PCT: 6 %
Neutro Abs: 18.5 10*3/uL — ABNORMAL HIGH (ref 1.7–7.7)
Neutrophils Relative %: 91 %

## 2015-10-30 LAB — TRIGLYCERIDES: Triglycerides: 41 mg/dL (ref <150)

## 2015-10-30 LAB — COMPREHENSIVE METABOLIC PANEL
ALBUMIN: 1.7 g/dL — AB (ref 3.5–5.0)
ALT: 10 U/L — ABNORMAL LOW (ref 14–54)
ANION GAP: 6 (ref 5–15)
AST: 19 U/L (ref 15–41)
Alkaline Phosphatase: 61 U/L (ref 38–126)
BUN: 15 mg/dL (ref 6–20)
CO2: 23 mmol/L (ref 22–32)
Calcium: 7.1 mg/dL — ABNORMAL LOW (ref 8.9–10.3)
Chloride: 97 mmol/L — ABNORMAL LOW (ref 101–111)
Creatinine, Ser: 0.5 mg/dL (ref 0.44–1.00)
GFR calc Af Amer: >60 mL/min (ref >60)
GFR calc non Af Amer: >60 mL/min (ref >60)
GLUCOSE: 197 mg/dL — AB (ref 65–99)
POTASSIUM: 4.1 mmol/L (ref 3.5–5.1)
SODIUM: 126 mmol/L — AB (ref 135–145)
Total Bilirubin: 0.2 mg/dL — ABNORMAL LOW (ref 0.3–1.2)
Total Protein: 4.8 g/dL — ABNORMAL LOW (ref 6.5–8.1)

## 2015-10-30 LAB — PHOSPHORUS: Phosphorus: 2.2 mg/dL — ABNORMAL LOW (ref 2.5–4.6)

## 2015-10-30 LAB — CBC
HEMATOCRIT: 27.5 % — AB (ref 36.0–46.0)
Hemoglobin: 8.7 g/dL — ABNORMAL LOW (ref 12.0–15.0)
MCH: 26.3 pg (ref 26.0–34.0)
MCHC: 31.6 g/dL (ref 30.0–36.0)
MCV: 83.1 fL (ref 78.0–100.0)
Platelets: 246 10*3/uL (ref 150–400)
RBC: 3.31 MIL/uL — AB (ref 3.87–5.11)
RDW: 24.9 % — ABNORMAL HIGH (ref 11.5–15.5)
WBC: 20.3 10*3/uL — AB (ref 4.0–10.5)

## 2015-10-30 LAB — PREALBUMIN: Prealbumin: 3.3 mg/dL — ABNORMAL LOW (ref 18–38)

## 2015-10-30 LAB — MAGNESIUM: Magnesium: 1.7 mg/dL (ref 1.7–2.4)

## 2015-10-30 MED ORDER — PEG 3350-KCL-NA BICARB-NACL 420 G PO SOLR: 4000.0000 mL | Freq: Once | ORAL | Status: DC

## 2015-10-30 MED ORDER — SODIUM PHOSPHATE 3 MMOLE/ML IV SOLN
10.0000 mmol | Freq: Once | INTRAVENOUS | Status: AC
  Administered 2015-10-30: 10 mmol via INTRAVENOUS
  Filled 2015-10-30: qty 3.33

## 2015-10-30 MED ORDER — PEG 3350-KCL-NA BICARB-NACL 420 G PO SOLR
240.0000 mL | ORAL | Status: DC
  Filled 2015-10-30: qty 4000

## 2015-10-30 MED ORDER — PEG 3350-KCL-NA BICARB-NACL 420 G PO SOLR
4000.0000 mL | Freq: Once | ORAL | Status: DC
  Filled 2015-10-30: qty 4000

## 2015-10-30 MED ORDER — MAGNESIUM SULFATE IN D5W 10-5 MG/ML-% IV SOLN
1.0000 g | Freq: Once | INTRAVENOUS | Status: AC
  Administered 2015-10-30: 1 g via INTRAVENOUS
  Filled 2015-10-30 (×2): qty 100

## 2015-10-30 MED ORDER — TRACE MINERALS CR-CU-MN-SE-ZN 10-1000-500-60 MCG/ML IV SOLN
INTRAVENOUS | Status: DC
  Administered 2015-10-30: 19:00:00 via INTRAVENOUS
  Filled 2015-10-30: qty 1320

## 2015-10-30 MED ORDER — PIPERACILLIN-TAZOBACTAM 3.375 G IVPB
3.3750 g | Freq: Three times a day (TID) | INTRAVENOUS | Status: DC
  Administered 2015-10-30 – 2015-10-31 (×3): 3.375 g via INTRAVENOUS
  Filled 2015-10-30 (×5): qty 50

## 2015-10-30 MED ORDER — INSULIN ASPART 100 UNIT/ML ~~LOC~~ SOLN
0.0000 [IU] | SUBCUTANEOUS | Status: DC
  Administered 2015-10-30 (×3): 3 [IU] via SUBCUTANEOUS
  Administered 2015-10-30 – 2015-10-31 (×2): 2 [IU] via SUBCUTANEOUS
  Administered 2015-10-31: 3 [IU] via SUBCUTANEOUS
  Administered 2015-10-31: 5 [IU] via SUBCUTANEOUS

## 2015-10-30 MED ORDER — SODIUM CHLORIDE 0.9 % IV SOLN: INTRAVENOUS | Status: DC

## 2015-10-30 MED ORDER — FAT EMULSION 20 % IV EMUL
240.0000 mL | INTRAVENOUS | Status: DC
  Administered 2015-10-30: 240 mL via INTRAVENOUS
  Filled 2015-10-30: qty 250

## 2015-10-30 MED ORDER — PEG 3350-KCL-NA BICARB-NACL 420 G PO SOLR
4000.0000 mL | Freq: Once | ORAL | Status: AC
  Administered 2015-10-30: 4000 mL via ORAL
  Filled 2015-10-30: qty 4000

## 2015-10-30 MED ORDER — VANCOMYCIN HCL 500 MG IV SOLR
500.0000 mg | INTRAVENOUS | Status: DC
  Administered 2015-10-30: 500 mg via INTRAVENOUS
  Filled 2015-10-30 (×2): qty 500

## 2015-10-30 NOTE — Progress Notes (Signed)
OT Cancellation Note  Patient Details Name: Caroline Lewis MRN: [id number] DOB: 09/26/1924   Cancelled Treatment:    Reason Eval/Treat Not Completed: Patient not medically ready (MD DR Hulen Skains requesting) OT spoke directly to Dr Hulen Skains who request therapy hold on patient for 2 days. Pt currently very lethargic and medical decisions regarding surgery being considered. OT to hold at this time and notify PT Becca about medical hold for patient.   Vonita Moss   OTR/L Pager: [phone] Office: [phone] .  10/30/2015, 9:45 AM

## 2015-10-30 NOTE — Progress Notes (Signed)
PT Cancellation Note  Patient Details Name: Caroline Lewis MRN: [id number] DOB: 1924/07/19   Cancelled Treatment:    Reason Eval/Treat Not Completed: Patient not medically ready   Per OT report, Dr Hulen Skains is requesting to hold therapy on pt at this time (10/30/15) due to pt being lethargic and possibly having surgery.  PT will check back in at further date as MD requests.     Heywood Footman, [place] - Office   10/30/2015, 12:57 PM

## 2015-10-30 NOTE — Progress Notes (Signed)
PARENTERAL NUTRITION CONSULT NOTE - Follow Up  Pharmacy Consult for TPN Indication: Small bowel obstruction  Allergies  Allergen Reactions  . Fosamax [Alendronate Sodium] Other (See Comments)    It affected my IBS   Patient Measurements: Height: 5' (152.4 cm) Weight: 98 lb 1.7 oz (44.5 kg) IBW/kg (Calculated) : 45.5  Vital Signs: Temp: 97.5 F (36.4 C) (12/05 0600) Temp Source: Axillary (12/05 0600) BP: 104/74 mmHg (12/05 0439) Pulse Rate: 118 (12/05 0600) Intake/Output from previous day: 12/04 0701 - 12/05 0700 In: 4494.1 [I.V.:848.7; NG/GT:1650; IV Piggyback:400; TPN:1595.4] Out: 600 [Emesis/NG output:600]  Labs:  Recent Labs  10/28/15 0536 10/29/15 0444 10/29/15 1656 10/30/15 0540  WBC  --   --  19.1* 20.3*  HGB  --   --  9.1* 8.7*  HCT  --   --  27.9* 27.5*  PLT  --   --  260 246  INR 1.32 1.31 1.33  --     Recent Labs  10/29/15 1656 10/30/15 0540  NA 126* 126*  K 3.9 4.1  CL 98* 97*  CO2 24 23  GLUCOSE 165* 197*  BUN 13 15  CREATININE 0.46 0.50  CALCIUM 7.3* 7.1*  MG  --  1.7  PHOS  --  2.2*  PROT 4.7* 4.8*  ALBUMIN 1.7* 1.7*  AST 16 19  ALT 9* 10*  ALKPHOS 67 61  BILITOT 0.5 0.2*  PREALBUMIN  --  3.3*  TRIG  --  41   Estimated Creatinine Clearance: 33.5 mL/min (by C-G formula based on Cr of 0.5).   Insulin Requirements in the past 24 hours:  11 units SSI  Nutritional Goals: Per RD on 12/1 Kcal: 1300-1500 Protein: 55-70 g Fluid: 1.3-1.5 L  Current Nutrition:  Reports good appetite PTA, but unable to eat much food, weight loss, decreased albumin and total protein.  She is malnourished and cachetic.  Triglycerides = 61  Clinimix E 5/15 at 42ml/hr and 20% IVFE at 46ml/hr (Provides ~1400 kcal and 66 g of protein  Assessment: 81 year old female c/o N/V, constipation, sharp abdominal pain. States she has not had BM in ~ 2 weeks, has had 7 lb weight loss in one month. Surgery was consulted, CT shows a nearly completely obstructed R colonic  mass vs. Infection.  Surgeries/Procedures: Plan for colonoscopy on 12/5 but may be rescheduled due to nausea overnight and unable to tolerate prep.  GI: Small bowel obstruction due to R colon mass and probable neoplasm. Recent weight loss, last BM on 12/3.  Now thought to possibly have acute diverticulitis. Albumin continues to trend down to 1.7. Prealbumin went from 5.3 on 12/1 to 3.3 today. Surgery continuing with NGT and clamp as tolerated for GI prep. NG output is 62ml yesterday. Fleets enema bid on board - ?stop date  Endo: No hx of DM. CBGs trending up slightly (130-180s)  Lytes: Na low at 126, K wnl. CoCa 8.9. Phos low at 2.2 and trending down, Mg up to 1.7 after 2g on 12/3  Renal: SCr stable at 0.5, CrCl ~62ml/min. D5-1/2NS with 72meq/kcl stopped  Pulm: RA  Cards: BP controlled, HR tachy.  Hepatobil: LFTs wnl, Tbili low at 0.2.  Neuro: A&O  AC/Heme: On warfarin PTA for DVT in Oct 2016, INR subtherapeutic on admission, now on Lovenox.  ID: On day #4 of Flagyl and Cipro for acute diverticulitis. Afebrile, WBC remains elevated at 20.3.  Best Practices: Lovenox  TPN Access: PICC  TPN start date: 12/1 >12/3 (pt. Pulled PICC out)  replacing today 12/3  Plan:  Continue Clinimix E 5/15 at 69ml/hr Continue 20% IVFE at 60ml/hr This provides total of 1417 kcal and 66g of protein meeting 100% of patients daily needs Continue MVI and trace elements in TPN Increase to moderate SSI and adjust as needed Give 34mMol of NaPhos IV x 1 Give Mg 1g IV x 1 Monitor TPN labs, Bmet, Phos, Mg  Elenor Quinones, PharmD, BCPS Clinical Pharmacist Pager [phone] 10/30/2015 8:33 AM

## 2015-10-30 NOTE — Care Management Important Message (Signed)
Important Message  Patient Details  Name: Caroline Lewis MRN: [id number] Date of Birth: [birth year]   Medicare Important Message Given:  Yes    Caroline Lewis [name] 10/30/2015, 12:08 PM

## 2015-10-30 NOTE — Progress Notes (Signed)
Family Medicine Teaching Service Daily Progress Note Intern Pager: [phone]  Patient name: Caroline Lewis Medical record number: [id number] Date of birth: [birth year] Age: 81 y.o. Gender: female  Primary Care Provider: Zigmund Gottron, MD Consultants: Gen Surg, GI  Code Status: DNR  Pt Overview and Major Events to Date:  11/30: Admitted for SBO   Assessment and Plan: Caroline Lewis is a 81 y.o. female presenting with nausea, vomiting and intermittent sharp abdominal pain. PMH is significant for IBS, RA, constipation, GERD, PVD, postmenopausal bleeding, hiatal hernia, and recent DVT started on coumadin.   Small bowel obstruction: Seen on CT abdomen/pelvis. Also with colonic mass concerning for neoplasm as possible source of inflammation that could have caused obstruction. NG tube placed under fluoro.  KUB from 12/2 demonstrates improvement in bowel obstruction without free air. KUB from 12/4 does not demonstrate dilated bowel loops, but stool burden still appreciated on imaging. CEA WNL at 3.0.   - General surgery consult; appreciate recommendations - continue with colonoscopy prep using Miralax by NG tube  -colonoscopy scheduled for 14:00 on 12/6, d/c Miralax prep 6 hrs prior to colonoscopy  - NPO. IVFs at 90 ml/hr.  - Continue NGT for decompression (with above clamping) - Fleets enema BID, consider milk of magnesia enema  - Tylenol prn for pain.  - Monitor I/Os - holding off on palliative care consult due to family wishes  -cipro/flagyl to treat for possible diverticulitis    SIRS: Patient continues to have a  leukocytosis. Isolated fever to 100.7 on AM of 12/5. Normal lactic acid at admission. No source of infection noted, possibly GI related? Family notes new onset of productive cough in the past 24 hours. Potential for atelectasis but also want to r/o PNA.  -CXR ordered  - Monitor for fever - Day 4 of cipro/flagyl  Chest pain: Low suspicion for ACS given normal EKG,  but troponin of 0.04 in ED, troponins stable. Suspect GERD given patient's history.  - IV protonix while NPO   Constipation: Chronic, has not had a bowel movement in 2 weeks, per family but small BMs with enemas. - ducolax suppository ordered prn - MiraLax 8oz per GI as above.   Recent Left DVT: INR 1.59 (12/2)  -Lovenox at treatment dose, holding home coumadin   Dysphagia: Thought to be due to candidal esophagitis at last admission.  - Ordered bedside SLP consult once eating.   Anemia of chronic disease: HgB 8.7.  During last admission, anemia panel significant for borderline low folate at 5.9, iron, ferretin and TIBC. - Holding home ferrous sulfate and folic acid as NPO.  - Consider IV iron due to her propensity towards constipation.  RA: Patient has decreased mobility 2/2 RA and gets around at home with an electric scooter.  - Patient takes weekly methotrexate 20 mg on Fridays. Will hold off on ordering as patient is still NPO.   LE Edema: Improved with treatment of DVT. HFpEF, with normal EF and grade 2 diastolic dysfunction on ECHO 11/28.  - Continue to monitor.   Protein calorie malnutrition: With excessive muscular atrophy and ongoing weight loss possibly related to neoplasm and/or inanition. Chronic constipation now with SBO likely contributing to nausea and feeling of fullness. Likely need to r/o neoplasm with colonoscopy.  - Patient has lost nearly 7 lbs since last office visit 09/28/15.  - Nutrition consult prior to discharge -TPN started on 12/1   Cough: Recently treated as HCAP with levofloxacin. Completed course.  - Patient saturating 92-96%  on room air. - No evidence of PNA on CXR.   FEN/GI: NPO. D51/2NS @ 90 ml/hr while NPO. TPN Prophylaxis: Lovenox   Disposition: Pending management of SBO and colonic mass   Subjective:  One large BM overnight. Resting comfortably. Family reports new wet, productive cough since yesterday.   Objective: Temp:  [97.5 F (36.4  C)-100.7 F (38.2 C)] 97.5 F (36.4 C) (12/05 0600) Pulse Rate:  [108-135] 118 (12/05 0600) Resp:  [15-16] 16 (12/05 0600) BP: (104-125)/(62-75) 104/74 mmHg (12/05 0439) SpO2:  [91 %-96 %] 96 % (12/05 0600) Physical Exam: General: cachetic elderly female resting in bed in NAD  Cardiovascular: Tachycardic, regular rhythm  Lungs: Diffuse rhonchi and crackles, poor air movement at the bases, cough appreciated   Abdomen: Hypoactive bowel sounds, distended, soft, no TTP, no masses appreciated, no guarding or rebound tenderness  MSK: 2+ pitting edema of the dorsum of the L foot, 1+ on the right.    Laboratory:  Recent Labs Lab 10/27/15 0500 10/29/15 1656 10/30/15 0540  WBC 19.1* 19.1* 20.3*  HGB 9.7* 9.1* 8.7*  HCT 31.6* 27.9* 27.5*  PLT 244 260 246    Recent Labs Lab 10/27/15 0500 10/29/15 1656 10/30/15 0540  NA 133* 126* 126*  K 3.6 3.9 4.1  CL 100* 98* 97*  CO2 26 24 23   BUN 10 13 15   CREATININE 0.53 0.46 0.50  CALCIUM 7.5* 7.3* 7.1*  PROT 4.6* 4.7* 4.8*  BILITOT 0.4 0.5 0.2*  ALKPHOS 67 67 61  ALT 10* 9* 10*  AST 20 16 19   GLUCOSE 177* 165* 197*     Imaging/Diagnostic Tests: Dg Chest 2 View  10/24/2015  CLINICAL DATA:  Epigastric pain.  nausea and vomiting. EXAM: CHEST  2 VIEW COMPARISON:  09/20/2015 FINDINGS: Chronic interstitial lung disease again seen with bibasilar predominance. No evidence of acute infiltrate or pleural effusion. Heart size remains at the upper limits of normal and stable. IMPRESSION: Chronic basilar predominant interstitial lung disease. No acute findings. Electronically Signed   By: Earle Gell M.D.   On: 10/10/2015 20:30   Dg Abd 1 View  10/26/2015  CLINICAL DATA:  Nasogastric tube placed under fluoroscopy. Fluoro time 3 minutes and 42 seconds. EXAM: ABDOMEN - 1 VIEW COMPARISON:  CT, 09/30/2015 FINDINGS: Nasogastric tube passes below the diaphragm to curl within the mid to distal stomach, well positioned. IMPRESSION: Well-positioned  nasogastric tube. Electronically Signed   By: Lajean Manes M.D.   On: 10/26/2015 14:54   Ct Abdomen Pelvis W Contrast  10/10/2015  CLINICAL DATA:  Generalized sharp abdominal pain. Nausea and vomiting. EXAM: CT ABDOMEN AND PELVIS WITH CONTRAST TECHNIQUE: Multidetector CT imaging of the abdomen and pelvis was performed using the standard protocol following bolus administration of intravenous contrast. CONTRAST:  78mL OMNIPAQUE IOHEXOL 300 MG/ML  SOLN COMPARISON:  07/22/2015 FINDINGS: Lower chest:  Bibasilar fibrosis.  Normal heart size. Hepatobiliary: Normal liver. Mild intrahepatic biliary ductal dilatation. Normal gallbladder. Pancreas: Normal. Spleen: Normal. Adrenals/Urinary Tract: Normal adrenal glands. No solid renal mass. No obstructive uropathy. No urolithiasis. Decompressed bladder. Stomach/Bowel: Moderate-sized hiatal hernia. Severe small bowel dilatation measuring up to 3.7 cm. The small bowel is diffusely dilated. There is dilatation of the ascending colon with an air-fluid level with a relative pointer transition in the mid ascending colon where there is a mass slight soft tissue prominence. No pneumatosis, pneumoperitoneum or portal venous gas. Vascular/Lymphatic: Normal caliber abdominal aorta with atherosclerosis. Other: No fluid collection or hematoma. Musculoskeletal: Right total hip arthroplasty with  resulting beam hardening artifact obscuring portions of the pelvis. Three cannulated screws transfixing the femoral neck. No acute osseous abnormality. No lytic or sclerotic osseous lesion. S-shaped scoliosis of the thoracolumbar spine. Severe degenerative disc disease throughout the thoracolumbar spine. Chronic T8, T9, T11, T12 and L1 vertebral body compression fractures. IMPRESSION: 1. High-grade small bowel obstruction with a transition point in the mid ascending colon. Masslike prominence in the mid ascending colon concerning for neoplasm. Recommend further evaluation with colonoscopy.  Electronically Signed   By: Kathreen Devoid   On: 10/19/2015 23:55   Ir Fluoro Guide Cv Line Right  10/28/2015  CLINICAL DATA:  Obstructing colonic mass and need for parental nutrition. A right upper extremity PICC line placed 2 days ago has been inadvertently pulled out entirely by accident. EXAM: POWER PICC LINE PLACEMENT WITH ULTRASOUND AND FLUOROSCOPIC GUIDANCE FLUOROSCOPY TIME:  1 minutes and 42 seconds. PROCEDURE: The patient's daughter was advised of the possible risks and complications and agreed to undergo the procedure. The patient was then brought to the angiographic suite for the procedure. A time-out was performed prior to the procedure. The right arm was prepped with chlorhexidine, draped in the usual sterile fashion using maximum barrier technique (cap and mask, sterile gown, sterile gloves, large sterile sheet, hand hygiene and cutaneous antisepsis) and infiltrated locally with 1% Lidocaine. Ultrasound demonstrated patency of the right brachial vein, and this was documented with an image. Under real-time ultrasound guidance, this vein was accessed with a 21 gauge micropuncture needle and image documentation was performed. A 0.018 wire was introduced in to the vein. Over this, a 5.0 [place] dual lumen power injectable PICC was advanced to the lower SVC/right atrial junction. Fluoroscopy during the procedure and fluoro spot radiograph confirms appropriate catheter position. The catheter was flushed and covered with a sterile dressing. Catheter length: 36 cm COMPLICATIONS: None IMPRESSION: Successful right arm power injectable PICC line placement with ultrasound and fluoroscopic guidance. The catheter is ready for use. Electronically Signed   By: Aletta Edouard M.D.   On: 10/28/2015 11:42   Ir Fluoro Guide Cv Line Right  10/26/2015  CLINICAL DATA:  81 year old female with small bowel obstruction in need for durable central venous access for hydration and nutrition. EXAM: IR RIGHT FLOURO GUIDE CV  LINE; IR ULTRASOUND GUIDANCE VASC ACCESS RIGHT FLUOROSCOPY TIME:  2 minutes 16 seconds for a total of 8.4 mGy TECHNIQUE: The right arm was prepped with chlorhexidine, draped in the usual sterile fashion using maximum barrier technique (cap and mask, sterile gown, sterile gloves, large sterile sheet, hand hygiene and cutaneous antiseptic). Local anesthesia was attained by infiltration with 1% lidocaine. Ultrasound demonstrated patency of the right brachial vein, and this was documented with an image. Under real-time ultrasound guidance, this vein was accessed with a 21 gauge micropuncture needle and image documentation was performed. The needle was exchanged over a guidewire for a peel-away sheath through which a 6 [place] dual lumen power injectable PICC was advanced, and positioned with its tip at the lower SVC/right atrial junction. Fluoroscopy during the procedure and fluoro spot radiograph confirms appropriate catheter position. The catheter was flushed, secured to the skin with Prolene sutures, and covered with a sterile dressing. COMPLICATIONS: None.  The patient tolerated the procedure well. IMPRESSION: Successful placement of a right arm dual-lumen power injectable PICC with sonographic and fluoroscopic guidance. The catheter is ready for use. Signed, Criselda Peaches, MD [facility] Radiology Electronically Signed   By: Jacqulynn Cadet  M.D.   On: 10/26/2015 17:40   Ir US Guide Vasc Access Right  10/28/2015  CLINICAL DATA:  Obstructing colonic mass and need for parental nutrition. A right upper extremity PICC line placed 2 days ago has been inadvertently pulled out entirely by accident. EXAM: POWER PICC LINE PLACEMENT WITH ULTRASOUND AND FLUOROSCOPIC GUIDANCE FLUOROSCOPY TIME:  1 minutes and 42 seconds. PROCEDURE: The patient's daughter was advised of the possible risks and complications and agreed to undergo the procedure. The patient was then brought  to the angiographic suite for the procedure. A time-out was performed prior to the procedure. The right arm was prepped with chlorhexidine, draped in the usual sterile fashion using maximum barrier technique (cap and mask, sterile gown, sterile gloves, large sterile sheet, hand hygiene and cutaneous antisepsis) and infiltrated locally with 1% Lidocaine. Ultrasound demonstrated patency of the right brachial vein, and this was documented with an image. Under real-time ultrasound guidance, this vein was accessed with a 21 gauge micropuncture needle and image documentation was performed. A 0.018 wire was introduced in to the vein. Over this, a 5.0 [place] dual lumen power injectable PICC was advanced to the lower SVC/right atrial junction. Fluoroscopy during the procedure and fluoro spot radiograph confirms appropriate catheter position. The catheter was flushed and covered with a sterile dressing. Catheter length: 36 cm COMPLICATIONS: None IMPRESSION: Successful right arm power injectable PICC line placement with ultrasound and fluoroscopic guidance. The catheter is ready for use. Electronically Signed   By: Aletta Edouard M.D.   On: 10/28/2015 11:42   Ir US Guide Vasc Access Right  10/26/2015  CLINICAL DATA:  81 year old female with small bowel obstruction in need for durable central venous access for hydration and nutrition. EXAM: IR RIGHT FLOURO GUIDE CV LINE; IR ULTRASOUND GUIDANCE VASC ACCESS RIGHT FLUOROSCOPY TIME:  2 minutes 16 seconds for a total of 8.4 mGy TECHNIQUE: The right arm was prepped with chlorhexidine, draped in the usual sterile fashion using maximum barrier technique (cap and mask, sterile gown, sterile gloves, large sterile sheet, hand hygiene and cutaneous antiseptic). Local anesthesia was attained by infiltration with 1% lidocaine. Ultrasound demonstrated patency of the right brachial vein, and this was documented with an image. Under real-time ultrasound guidance, this vein was accessed with  a 21 gauge micropuncture needle and image documentation was performed. The needle was exchanged over a guidewire for a peel-away sheath through which a 6 [place] dual lumen power injectable PICC was advanced, and positioned with its tip at the lower SVC/right atrial junction. Fluoroscopy during the procedure and fluoro spot radiograph confirms appropriate catheter position. The catheter was flushed, secured to the skin with Prolene sutures, and covered with a sterile dressing. COMPLICATIONS: None.  The patient tolerated the procedure well. IMPRESSION: Successful placement of a right arm dual-lumen power injectable PICC with sonographic and fluoroscopic guidance. The catheter is ready for use. Signed, Criselda Peaches, MD [facility] Radiology Electronically Signed   By: Jacqulynn Cadet M.D.   On: 10/26/2015 17:40   Dg Abd 2 Views  10/27/2015  CLINICAL DATA:  Large bowel obstruction EXAM: ABDOMEN - 2 VIEW COMPARISON:  CT 10/19/2015 FINDINGS: NG tube in the stomach with the tip near the pylorus. Stomach decompressed. Improvement in dilated small bowel. Air-fluid levels are noted on the decubitus view. Much of the small bowel is fluid-filled on previous CT. No free air on the decubitus view. Contrast in the urinary bladder from recent CT. IMPRESSION: NG tube in the stomach.  Improvement in bowel obstruction pattern. No free air. Electronically Signed   By: Franchot Gallo M.D.   On: 10/27/2015 08:43   Dg Abd Portable 1v  10/29/2015  CLINICAL DATA:  Small-bowel obstruction secondary to colonic mass. History of diverticulitis of ascending colon. EXAM: PORTABLE ABDOMEN - 1 VIEW COMPARISON:  Abdominal plain films dated 10/27/2015 and 10/26/2015. Comparison also made to CT dated 10/19/2015. FINDINGS: NG tube remains in place with tip well-positioned near the expected region of the pylorus. There are no dilated bowel loops identified on today's exam. No evidence of  free intraperitoneal air. Degenerative changes again noted throughout the scoliotic thoracolumbar spine. No acute -appearing osseous abnormality. IMPRESSION: 1. Nasogastric tube well-positioned within the stomach with tip near the expected region of the pylorus. 2. No dilated bowel loops appreciated on today's exam. Electronically Signed   By: Franki Cabot M.D.   On: 10/29/2015 08:51   Dg Addison Bailey G Tube Plc W/fl-no Rad  10/26/2015  CLINICAL DATA:  NASO G TUBE PLACEMENT WITH FLUORO Fluoroscopy was utilized by the requesting physician.  No radiographic interpretation.    Nicolette Bang, DO 10/30/2015, 6:54 AM PGY-1, [place] Intern pager: [phone], text pages welcome

## 2015-10-30 NOTE — Progress Notes (Signed)
ANTIBIOTIC CONSULT NOTE - INITIAL  Pharmacy Consult for Vancomycin and Zosyn Indication: pneumonia  Allergies  Allergen Reactions  . Fosamax [Alendronate Sodium] Other (See Comments)    It affected my IBS    Patient Measurements: Height: 5' (152.4 cm) Weight: 98 lb 1.7 oz (44.5 kg) IBW/kg (Calculated) : 45.5   Vital Signs: Temp: 97.8 F (36.6 C) (12/05 1545) Temp Source: Axillary (12/05 1545) BP: 109/58 mmHg (12/05 1545) Pulse Rate: 115 (12/05 1545) Intake/Output from previous day: 12/04 0701 - 12/05 0700 In: 4534.1 [I.V.:888.7; NG/GT:1650; IV Piggyback:400; TPN:1595.4] Out: 600 [Emesis/NG output:600] Intake/Output from this shift: Total I/O In: 453.3 [IV Piggyback:453.3] Out: -   Labs:  Recent Labs  10/29/15 1656 10/30/15 0540  WBC 19.1* 20.3*  HGB 9.1* 8.7*  PLT 260 246  CREATININE 0.46 0.50   Estimated Creatinine Clearance: 33.5 mL/min (by C-G formula based on Cr of 0.5).   Assessment: 80 YOF here with R colon mass and on TPN with SBO. To start vancomycin and Zosyn for HCAP. Was on Cipro and Flagyl for GI reasons- Cipro stopped. WBC up at 20.3, tmax 100.7.  SCr 0.5, CrCl ~30-39mL/min.   Goal of Therapy:  Vancomycin trough level 15-20 mcg/ml  Plan:  -Vancomycin 500mg  IV q24h -Zosyn 3.375g IV q8h EI -follow c/s, clinical progression, renal function  Gordan Grell D. Navada Osterhout, PharmD, BCPS Clinical Pharmacist Pager: [phone] 10/30/2015 5:42 PM

## 2015-10-30 NOTE — Progress Notes (Signed)
Patient ID: Caroline Lewis, female   DOB: 1924-11-18, 81 y.o.   MRN: [id number]     [facility]      [address] [place], [place] [address]    Phone: [phone] FAX: [phone]     Subjective: Pt lethargic, tachypnic, tachycardic.  Had a rough night according to her family.  Unable to tolerate prep, NGT back to suction, output looks feculent although AXR improved yesterday.  Low grad e temp.  WBC up to 20.3k.  Na 126. Prealbumin worse at 3.3   Objective:  Vital signs:  Filed Vitals:   10/29/15 1306 10/29/15 2227 10/30/15 0439 10/30/15 0600  BP: 112/62 125/75 104/74   Pulse: 108 124 135 118  Temp: 97.9 F (36.6 C) 99 F (37.2 C) 100.7 F (38.2 C) 97.5 F (36.4 C)  TempSrc: Axillary Axillary Axillary Axillary  Resp: $Remo'16 16 15 16  'vrFmE$ Height:      Weight:      SpO2: 96% 91% 91% 96%    Last BM Date: 10/28/15  Intake/Output   Yesterday:  12/04 0701 - 12/05 0700 In: 4494.1 [I.V.:848.7; NG/GT:1650; IV Piggyback:400; GUY:4034.7] Out: 600 [Emesis/NG output:600] This shift:    I/O last 3 completed shifts: In: 5705.4 [I.V.:1270; NG/GT:1650; IV Piggyback:400] Out: 925 [Emesis/NG output:925]    Physical Exam: General: Pt lethargic Chest: bilateral crackles CV: S1S2 RRR, tachycardic.  Abdomen: +BS, abdomen is soft, but distended.  Non tender.    Problem List:   Active Problems:   Small bowel obstruction (HCC)   Large bowel obstruction (HCC)   Cachexia (HCC)   SBO (small bowel obstruction) ([place])    Results:   Labs: Results for orders placed or performed during the hospital encounter of 09/29/2015 (from the past 48 hour(s))  Glucose, capillary     Status: Abnormal   Collection Time: 10/28/15  8:30 PM  Result Value Ref Range   Glucose-Capillary 147 (H) 65 - 99 mg/dL   Comment 1 Notify RN    Comment 2 Document in Chart   Glucose, capillary     Status: Abnormal   Collection Time: 10/29/15 12:09 AM  Result Value  Ref Range   Glucose-Capillary 162 (H) 65 - 99 mg/dL   Comment 1 Notify RN    Comment 2 Document in Chart   Glucose, capillary     Status: Abnormal   Collection Time: 10/29/15  4:15 AM  Result Value Ref Range   Glucose-Capillary 134 (H) 65 - 99 mg/dL   Comment 1 Notify RN    Comment 2 Document in Chart   Protime-INR     Status: Abnormal   Collection Time: 10/29/15  4:44 AM  Result Value Ref Range   Prothrombin Time 16.4 (H) 11.6 - 15.2 seconds   INR 1.31 0.00 - 1.49  Glucose, capillary     Status: Abnormal   Collection Time: 10/29/15  8:14 AM  Result Value Ref Range   Glucose-Capillary 136 (H) 65 - 99 mg/dL   Comment 1 Notify RN   Glucose, capillary     Status: Abnormal   Collection Time: 10/29/15 11:24 AM  Result Value Ref Range   Glucose-Capillary 169 (H) 65 - 99 mg/dL   Comment 1 Notify RN   Glucose, capillary     Status: Abnormal   Collection Time: 10/29/15  4:50 PM  Result Value Ref Range   Glucose-Capillary 164 (H) 65 - 99 mg/dL   Comment 1 Notify RN   CBC  Status: Abnormal   Collection Time: 10/29/15  4:56 PM  Result Value Ref Range   WBC 19.1 (H) 4.0 - 10.5 K/uL   RBC 3.40 (L) 3.87 - 5.11 MIL/uL   Hemoglobin 9.1 (L) 12.0 - 15.0 g/dL   HCT 19.4 (L) 80.8 - 23.9 %   MCV 82.1 78.0 - 100.0 fL   MCH 26.8 26.0 - 34.0 pg   MCHC 32.6 30.0 - 36.0 g/dL   RDW 88.3 (H) 00.3 - 30.8 %   Platelets 260 150 - 400 K/uL  Comprehensive metabolic panel     Status: Abnormal   Collection Time: 10/29/15  4:56 PM  Result Value Ref Range   Sodium 126 (L) 135 - 145 mmol/L   Potassium 3.9 3.5 - 5.1 mmol/L   Chloride 98 (L) 101 - 111 mmol/L   CO2 24 22 - 32 mmol/L   Glucose, Bld 165 (H) 65 - 99 mg/dL   BUN 13 6 - 20 mg/dL   Creatinine, Ser 9.97 0.44 - 1.00 mg/dL   Calcium 7.3 (L) 8.9 - 10.3 mg/dL   Total Protein 4.7 (L) 6.5 - 8.1 g/dL   Albumin 1.7 (L) 3.5 - 5.0 g/dL   AST 16 15 - 41 U/L   ALT 9 (L) 14 - 54 U/L   Alkaline Phosphatase 67 38 - 126 U/L   Total Bilirubin 0.5 0.3 -  1.2 mg/dL   GFR calc non Af Amer >60 >60 mL/min   GFR calc Af Amer >60 >60 mL/min    Comment: (NOTE) The eGFR has been calculated using the CKD EPI equation. This calculation has not been validated in all clinical situations. eGFR's persistently <60 mL/min signify possible Chronic Kidney Disease.    Anion gap 4 (L) 5 - 15  Protime-INR     Status: Abnormal   Collection Time: 10/29/15  4:56 PM  Result Value Ref Range   Prothrombin Time 16.6 (H) 11.6 - 15.2 seconds   INR 1.33 0.00 - 1.49  Glucose, capillary     Status: Abnormal   Collection Time: 10/29/15  8:06 PM  Result Value Ref Range   Glucose-Capillary 161 (H) 65 - 99 mg/dL   Comment 1 Notify RN    Comment 2 Document in Chart   Glucose, capillary     Status: Abnormal   Collection Time: 10/30/15 12:06 AM  Result Value Ref Range   Glucose-Capillary 181 (H) 65 - 99 mg/dL   Comment 1 Notify RN    Comment 2 Document in Chart   Glucose, capillary     Status: Abnormal   Collection Time: 10/30/15  4:08 AM  Result Value Ref Range   Glucose-Capillary 173 (H) 65 - 99 mg/dL   Comment 1 Notify RN    Comment 2 Document in Chart   Comprehensive metabolic panel     Status: Abnormal   Collection Time: 10/30/15  5:40 AM  Result Value Ref Range   Sodium 126 (L) 135 - 145 mmol/L   Potassium 4.1 3.5 - 5.1 mmol/L   Chloride 97 (L) 101 - 111 mmol/L   CO2 23 22 - 32 mmol/L   Glucose, Bld 197 (H) 65 - 99 mg/dL   BUN 15 6 - 20 mg/dL   Creatinine, Ser 1.67 0.44 - 1.00 mg/dL   Calcium 7.1 (L) 8.9 - 10.3 mg/dL   Total Protein 4.8 (L) 6.5 - 8.1 g/dL   Albumin 1.7 (L) 3.5 - 5.0 g/dL   AST 19 15 - 41 U/L  ALT 10 (L) 14 - 54 U/L   Alkaline Phosphatase 61 38 - 126 U/L   Total Bilirubin 0.2 (L) 0.3 - 1.2 mg/dL   GFR calc non Af Amer >60 >60 mL/min   GFR calc Af Amer >60 >60 mL/min    Comment: (NOTE) The eGFR has been calculated using the CKD EPI equation. This calculation has not been validated in all clinical situations. eGFR's persistently  <60 mL/min signify possible Chronic Kidney Disease.    Anion gap 6 5 - 15  Magnesium     Status: None   Collection Time: 10/30/15  5:40 AM  Result Value Ref Range   Magnesium 1.7 1.7 - 2.4 mg/dL  Phosphorus     Status: Abnormal   Collection Time: 10/30/15  5:40 AM  Result Value Ref Range   Phosphorus 2.2 (L) 2.5 - 4.6 mg/dL  CBC     Status: Abnormal   Collection Time: 10/30/15  5:40 AM  Result Value Ref Range   WBC 20.3 (H) 4.0 - 10.5 K/uL   RBC 3.31 (L) 3.87 - 5.11 MIL/uL   Hemoglobin 8.7 (L) 12.0 - 15.0 g/dL   HCT 27.5 (L) 36.0 - 46.0 %   MCV 83.1 78.0 - 100.0 fL   MCH 26.3 26.0 - 34.0 pg   MCHC 31.6 30.0 - 36.0 g/dL   RDW 24.9 (H) 11.5 - 15.5 %   Platelets 246 150 - 400 K/uL  Differential     Status: None (Preliminary result)   Collection Time: 10/30/15  5:40 AM  Result Value Ref Range   Neutrophils Relative % PENDING %   Neutro Abs PENDING 1.7 - 7.7 K/uL   Band Neutrophils PENDING %   Lymphocytes Relative PENDING %   Lymphs Abs PENDING 0.7 - 4.0 K/uL   Monocytes Relative PENDING %   Monocytes Absolute PENDING 0.1 - 1.0 K/uL   Eosinophils Relative PENDING %   Eosinophils Absolute PENDING 0.0 - 0.7 K/uL   Basophils Relative PENDING %   Basophils Absolute PENDING 0.0 - 0.1 K/uL   WBC Morphology PENDING    RBC Morphology PENDING    Smear Review PENDING    nRBC PENDING 0 /100 WBC   Metamyelocytes Relative PENDING %   Myelocytes PENDING %   Promyelocytes Absolute PENDING %   Blasts PENDING %  Prealbumin     Status: Abnormal   Collection Time: 10/30/15  5:40 AM  Result Value Ref Range   Prealbumin 3.3 (L) 18 - 38 mg/dL  Triglycerides     Status: None   Collection Time: 10/30/15  5:40 AM  Result Value Ref Range   Triglycerides 41 <150 mg/dL  Glucose, capillary     Status: Abnormal   Collection Time: 10/30/15  7:26 AM  Result Value Ref Range   Glucose-Capillary 153 (H) 65 - 99 mg/dL   Comment 1 Notify RN     Imaging / Studies: Ir Fluoro Guide Cv Line  Right  10/28/2015  CLINICAL DATA:  Obstructing colonic mass and need for parental nutrition. A right upper extremity PICC line placed 2 days ago has been inadvertently pulled out entirely by accident. EXAM: POWER PICC LINE PLACEMENT WITH ULTRASOUND AND FLUOROSCOPIC GUIDANCE FLUOROSCOPY TIME:  1 minutes and 42 seconds. PROCEDURE: The patient's daughter was advised of the possible risks and complications and agreed to undergo the procedure. The patient was then brought to the angiographic suite for the procedure. A time-out was performed prior to the procedure. The right arm was prepped with  chlorhexidine, draped in the usual sterile fashion using maximum barrier technique (cap and mask, sterile gown, sterile gloves, large sterile sheet, hand hygiene and cutaneous antisepsis) and infiltrated locally with 1% Lidocaine. Ultrasound demonstrated patency of the right brachial vein, and this was documented with an image. Under real-time ultrasound guidance, this vein was accessed with a 21 gauge micropuncture needle and image documentation was performed. A 0.018 wire was introduced in to the vein. Over this, a 5.0 [place] dual lumen power injectable PICC was advanced to the lower SVC/right atrial junction. Fluoroscopy during the procedure and fluoro spot radiograph confirms appropriate catheter position. The catheter was flushed and covered with a sterile dressing. Catheter length: 36 cm COMPLICATIONS: None IMPRESSION: Successful right arm power injectable PICC line placement with ultrasound and fluoroscopic guidance. The catheter is ready for use. Electronically Signed   By: Aletta Edouard M.D.   On: 10/28/2015 11:42   Ir US Guide Vasc Access Right  10/28/2015  CLINICAL DATA:  Obstructing colonic mass and need for parental nutrition. A right upper extremity PICC line placed 2 days ago has been inadvertently pulled out entirely by accident. EXAM: POWER PICC LINE PLACEMENT WITH ULTRASOUND AND FLUOROSCOPIC GUIDANCE  FLUOROSCOPY TIME:  1 minutes and 42 seconds. PROCEDURE: The patient's daughter was advised of the possible risks and complications and agreed to undergo the procedure. The patient was then brought to the angiographic suite for the procedure. A time-out was performed prior to the procedure. The right arm was prepped with chlorhexidine, draped in the usual sterile fashion using maximum barrier technique (cap and mask, sterile gown, sterile gloves, large sterile sheet, hand hygiene and cutaneous antisepsis) and infiltrated locally with 1% Lidocaine. Ultrasound demonstrated patency of the right brachial vein, and this was documented with an image. Under real-time ultrasound guidance, this vein was accessed with a 21 gauge micropuncture needle and image documentation was performed. A 0.018 wire was introduced in to the vein. Over this, a 5.0 [place] dual lumen power injectable PICC was advanced to the lower SVC/right atrial junction. Fluoroscopy during the procedure and fluoro spot radiograph confirms appropriate catheter position. The catheter was flushed and covered with a sterile dressing. Catheter length: 36 cm COMPLICATIONS: None IMPRESSION: Successful right arm power injectable PICC line placement with ultrasound and fluoroscopic guidance. The catheter is ready for use. Electronically Signed   By: Aletta Edouard M.D.   On: 10/28/2015 11:42   Dg Abd Portable 1v  10/29/2015  CLINICAL DATA:  Small-bowel obstruction secondary to colonic mass. History of diverticulitis of ascending colon. EXAM: PORTABLE ABDOMEN - 1 VIEW COMPARISON:  Abdominal plain films dated 10/27/2015 and 10/26/2015. Comparison also made to CT dated 10/11/2015. FINDINGS: NG tube remains in place with tip well-positioned near the expected region of the pylorus. There are no dilated bowel loops identified on today's exam. No evidence of free intraperitoneal air. Degenerative changes again noted throughout the scoliotic thoracolumbar spine. No acute  -appearing osseous abnormality. IMPRESSION: 1. Nasogastric tube well-positioned within the stomach with tip near the expected region of the pylorus. 2. No dilated bowel loops appreciated on today's exam. Electronically Signed   By: Franki Cabot M.D.   On: 10/29/2015 08:51    Medications / Allergies:  Scheduled Meds: . antiseptic oral rinse  7 mL Mouth Rinse q12n4p  . chlorhexidine  15 mL Mouth Rinse BID  . ciprofloxacin  200 mg Intravenous Q12H  . enoxaparin (LOVENOX) injection  1 mg/kg Subcutaneous Q12H  . insulin aspart  0-9 Units Subcutaneous  6 times per day  . metronidazole  500 mg Intravenous Q8H  . pantoprazole (PROTONIX) IV  40 mg Intravenous Q24H  . sodium chloride  10-40 mL Intracatheter Q12H  . sodium phosphate  1 enema Rectal BID   Continuous Infusions: . sodium chloride    . dextrose 5 % and 0.45 % NaCl with KCl 20 mEq/L 1 mL (10/29/15 2357)  . Marland KitchenTPN (CLINIMIX-E) Adult 55 mL/hr at 10/29/15 2357   And  . fat emulsion 240 mL (10/30/15 0600)   PRN Meds:.acetaminophen **OR** acetaminophen, benzonatate, bisacodyl, guaiFENesin, menthol-cetylpyridinium, morphine injection, promethazine, sodium chloride  Antibiotics: Anti-infectives    Start     Dose/Rate Route Frequency Ordered Stop   10/28/15 1500  ciprofloxacin (CIPRO) IVPB 200 mg     200 mg 100 mL/hr over 60 Minutes Intravenous Every 12 hours 10/28/15 1046     10/27/15 1500  ciprofloxacin (CIPRO) IVPB 400 mg  Status:  Discontinued     400 mg 200 mL/hr over 60 Minutes Intravenous Every 12 hours 10/27/15 1458 10/28/15 1045   10/27/15 1500  metroNIDAZOLE (FLAGYL) IVPB 500 mg     500 mg 100 mL/hr over 60 Minutes Intravenous Every 8 hours 10/27/15 1458         Assessment/Plan 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  -CEA 3 -colonoscopy per GI  ID-cipro/flagyl ?tics v mass PCM-pre-albumin 3.3,  TPN DVT(Dx 08/27/15)-coumadin on hold, lovenox   Erby Pian, ANP-BC [facility] Pager [phone](7A-4:30P) For consults and floor pages call [phone](7A-4:30P)  10/30/2015 8:36 AM

## 2015-10-30 NOTE — Progress Notes (Signed)
Caroline Lewis 2:11 PM  Subjective: Patient without any new complaints unfortunately did not tolerate prep yesterday and I discussed her case with the medical team and family  Objective: Vital signs stable afebrile no acute distress abdomen is soft rare bowel sounds nontender not as distended as yesterday WBC is still elevated Assessment: Abnormal CAT scan  Plan: Will try to prep today even slower and discussed with the nurse in the family and the patient and the medical team and will proceed with colonoscopy tomorrow at 2 PM unless something changes  [facility]  Pager [phone] After 5PM or if no answer call [phone]

## 2015-10-31 ENCOUNTER — Inpatient Hospital Stay (HOSPITAL_COMMUNITY): Payer: Commercial Managed Care - HMO | Admitting: Certified Registered"

## 2015-10-31 ENCOUNTER — Encounter (HOSPITAL_COMMUNITY): Admission: EM | Disposition: E | Payer: Self-pay | Source: Home / Self Care | Attending: Family Medicine

## 2015-10-31 ENCOUNTER — Encounter (HOSPITAL_COMMUNITY): Payer: Self-pay | Admitting: *Deleted

## 2015-10-31 DIAGNOSIS — D72829 Elevated white blood cell count, unspecified: Secondary | ICD-10-CM

## 2015-10-31 DIAGNOSIS — J189 Pneumonia, unspecified organism: Secondary | ICD-10-CM

## 2015-10-31 DIAGNOSIS — Y95 Nosocomial condition: Secondary | ICD-10-CM

## 2015-10-31 DIAGNOSIS — Z66 Do not resuscitate: Secondary | ICD-10-CM

## 2015-10-31 DIAGNOSIS — Z515 Encounter for palliative care: Secondary | ICD-10-CM

## 2015-10-31 DIAGNOSIS — R0600 Dyspnea, unspecified: Secondary | ICD-10-CM

## 2015-10-31 HISTORY — PX: COLONOSCOPY WITH PROPOFOL: SHX5780

## 2015-10-31 LAB — BASIC METABOLIC PANEL
ANION GAP: 4 — AB (ref 5–15)
BUN: 16 mg/dL (ref 6–20)
CALCIUM: 7.3 mg/dL — AB (ref 8.9–10.3)
CO2: 25 mmol/L (ref 22–32)
Chloride: 98 mmol/L — ABNORMAL LOW (ref 101–111)
Creatinine, Ser: 0.4 mg/dL — ABNORMAL LOW (ref 0.44–1.00)
GFR calc Af Amer: >60 mL/min (ref >60)
GLUCOSE: 146 mg/dL — AB (ref 65–99)
POTASSIUM: 4 mmol/L (ref 3.5–5.1)
Sodium: 127 mmol/L — ABNORMAL LOW (ref 135–145)

## 2015-10-31 LAB — PHOSPHORUS: PHOSPHORUS: 2 mg/dL — AB (ref 2.5–4.6)

## 2015-10-31 LAB — GLUCOSE, CAPILLARY
GLUCOSE-CAPILLARY: 145 mg/dL — AB (ref 65–99)
GLUCOSE-CAPILLARY: 157 mg/dL — AB (ref 65–99)
GLUCOSE-CAPILLARY: 159 mg/dL — AB (ref 65–99)
Glucose-Capillary: 225 mg/dL — ABNORMAL HIGH (ref 65–99)

## 2015-10-31 LAB — MAGNESIUM: Magnesium: 1.9 mg/dL (ref 1.7–2.4)

## 2015-10-31 SURGERY — COLONOSCOPY WITH PROPOFOL
Anesthesia: Monitor Anesthesia Care

## 2015-10-31 MED ORDER — MORPHINE SULFATE (PF) 2 MG/ML IV SOLN: 2.0000 mg | INTRAVENOUS | Status: DC | PRN

## 2015-10-31 MED ORDER — MORPHINE SULFATE 25 MG/ML IV SOLN
2.0000 mg/h | INTRAVENOUS | Status: DC
  Administered 2015-10-31: 2 mg/h via INTRAVENOUS
  Filled 2015-10-31: qty 10

## 2015-10-31 MED ORDER — LIDOCAINE HCL (CARDIAC) 20 MG/ML IV SOLN
INTRAVENOUS | Status: DC | PRN
  Administered 2015-10-31: 10 mg via INTRAVENOUS
  Administered 2015-10-31 (×3): 5 mg via INTRAVENOUS
  Administered 2015-10-31: 10 mg via INTRAVENOUS

## 2015-10-31 MED ORDER — FAT EMULSION 20 % IV EMUL
240.0000 mL | INTRAVENOUS | Status: DC
  Filled 2015-10-31: qty 250

## 2015-10-31 MED ORDER — SODIUM PHOSPHATE 3 MMOLE/ML IV SOLN
20.0000 mmol | Freq: Once | INTRAVENOUS | Status: AC
  Administered 2015-10-31: 20 mmol via INTRAVENOUS
  Filled 2015-10-31: qty 6.67

## 2015-10-31 MED ORDER — MORPHINE BOLUS VIA INFUSION
1.0000 mg | INTRAVENOUS | Status: DC | PRN
  Filled 2015-10-31: qty 1

## 2015-10-31 MED ORDER — MORPHINE SULFATE (PF) 2 MG/ML IV SOLN: 1.0000 mg | INTRAVENOUS | Status: DC | PRN

## 2015-10-31 MED ORDER — LORAZEPAM 2 MG/ML IJ SOLN
1.0000 mg | Freq: Once | INTRAMUSCULAR | Status: AC
Start: 2015-10-31 — End: 2015-10-31
  Administered 2015-10-31: 1 mg via INTRAVENOUS
  Filled 2015-10-31: qty 1

## 2015-10-31 MED ORDER — TRACE MINERALS CR-CU-MN-SE-ZN 10-1000-500-60 MCG/ML IV SOLN
INTRAVENOUS | Status: DC
  Filled 2015-10-31: qty 1320

## 2015-10-31 MED ORDER — LACTATED RINGERS IV SOLN
INTRAVENOUS | Status: DC | PRN
  Administered 2015-10-31: 10:00:00 via INTRAVENOUS

## 2015-10-31 MED ORDER — PROPOFOL 10 MG/ML IV BOLUS
INTRAVENOUS | Status: DC | PRN
  Administered 2015-10-31: 10 mg via INTRAVENOUS
  Administered 2015-10-31: 5 mg via INTRAVENOUS
  Administered 2015-10-31: 10 mg via INTRAVENOUS

## 2015-10-31 MED ORDER — GLYCOPYRROLATE 0.2 MG/ML IJ SOLN
0.4000 mg | INTRAMUSCULAR | Status: DC
  Administered 2015-10-31 – 2015-11-01 (×6): 0.4 mg via INTRAVENOUS
  Filled 2015-10-31 (×6): qty 2

## 2015-10-31 NOTE — Progress Notes (Signed)
PARENTERAL NUTRITION CONSULT NOTE - Follow Up  Pharmacy Consult for TPN Indication: Small bowel obstruction  Allergies  Allergen Reactions  . Fosamax [Alendronate Sodium] Other (See Comments)    It affected my IBS   Patient Measurements: Height: 5' (152.4 cm) Weight: 98 lb 1.7 oz (44.5 kg) IBW/kg (Calculated) : 45.5  Vital Signs: Temp: 98.2 F (36.8 C) (12/06 0554) Temp Source: Oral (12/06 0554) BP: 142/78 mmHg (12/06 0554) Pulse Rate: 109 (12/06 0554) Intake/Output from previous day: 12/05 0701 - 12/06 0700 In: 2611.9 [I.V.:364.7; IV Piggyback:703.3; TPN:1543.9] Out: 400 [Emesis/NG output:400]  Labs:  Recent Labs  10/29/15 0444 10/29/15 1656 10/30/15 0540  WBC  --  19.1* 20.3*  HGB  --  9.1* 8.7*  HCT  --  27.9* 27.5*  PLT  --  260 246  INR 1.31 1.33  --     Recent Labs  10/29/15 1656 10/30/15 0540 11/21/2015 0545  NA 126* 126* 127*  K 3.9 4.1 4.0  CL 98* 97* 98*  CO2 24 23 25   GLUCOSE 165* 197* 146*  BUN 13 15 16   CREATININE 0.46 0.50 0.40*  CALCIUM 7.3* 7.1* 7.3*  MG  --  1.7 1.9  PHOS  --  2.2* 2.0*  PROT 4.7* 4.8*  --   ALBUMIN 1.7* 1.7*  --   AST 16 19  --   ALT 9* 10*  --   ALKPHOS 67 61  --   BILITOT 0.5 0.2*  --   PREALBUMIN  --  3.3*  --   TRIG  --  41  --    Estimated Creatinine Clearance: 33.5 mL/min (by C-G formula based on Cr of 0.4).   Insulin Requirements in the past 24 hours:  16 units SSI  Nutritional Goals: Per RD on 12/1 Kcal: 1300-1500 Protein: 55-70 g Fluid: 1.3-1.5 L  Current Nutrition:  Reports good appetite PTA, but unable to eat much food, weight loss, decreased albumin and total protein.  She is malnourished and cachetic.  Triglycerides = 61  Clinimix E 5/15 at 72ml/hr and 20% IVFE at 32ml/hr (Provides ~1400 kcal and 66 g of protein  Assessment: 81 year old female c/o N/V, constipation, sharp abdominal pain. States she has not had BM in ~2 weeks, has had 7 lb weight loss in one month. Surgery was consulted, CT  shows a nearly completely obstructed R colonic mass vs. Infection.  Surgeries/Procedures: Plan for colonoscopy on 12/5 but rescheduled for 12/6 due to nausea overnight and unable to tolerate prep.  GI: Small bowel obstruction due to R colon mass and probable neoplasm. Recent weight loss, last BM on 12/3.  Now thought to possibly have acute diverticulitis. Albumin continues to trend down to 1.7. Prealbumin went from 5.3 on 12/1 to 3.3 today. Surgery continuing with NGT and clamp as tolerated for GI prep. NG output is 438ml yesterday. Fleets enema bid on board - ?stop date  Endo: No hx of DM. CBGs controlled (140-150s)  Lytes: Na low at 127, K wnl. CoCa 9.1. Phos low at 2.0 (after 57mMol yesterday) and Mg up to 1.9  Renal: SCr stable at 0.5, CrCl ~14ml/min. Had 7 urine occurences yesterday. D5-1/2NS with 18meq/kcl stopped  Pulm: RA  Cards: BP controlled, HR tachy.  Hepatobil: LFTs wnl, Tbili low at 0.2.  Neuro: A&O  AC/Heme: On warfarin PTA for DVT in Oct 2016, INR subtherapeutic on admission, now on Lovenox.  ID: Day #1 of abx for PNA. Was on cipro and flagyl for GI reasons. Afebrile  and WBC elevated at 20.3. Afebrile, WBC remains elevated at 20.3.  Best Practices: Lovenox  TPN Access: PICC  TPN start date: 12/1 >12/3 (pt. Pulled PICC out) replaced 12/3 >>  Plan:  Continue Clinimix E 5/15 at 47ml/hr Continue 20% IVFE at 15ml/hr This provides total of 1417 kcal and 66g of protein meeting 100% of patients daily needs Continue MVI and trace elements in TPN Continue moderate SSI and adjust as needed Give 52mMol of NaPhos IV x 1 today Monitor TPN labs, Bmet and Phos tomorrow  Elenor Quinones, PharmD, BCPS Clinical Pharmacist Pager [phone] 11/23/2015 7:20 AM

## 2015-10-31 NOTE — Anesthesia Preprocedure Evaluation (Addendum)
Anesthesia Evaluation  Patient identified by MRN, date of birth, ID bandGeneral Assessment Comment:Pt very sleepy, history from daughter  Reviewed: Allergy & Precautions, NPO status , Patient's Chart, lab work & pertinent test results  History of Anesthesia Complications Negative for: history of anesthetic complications  Airway Mallampati: II  TM Distance: >3 FB Neck ROM: Full    Dental  (+) Dental Advisory Given   Pulmonary pneumonia, former smoker (quit 1989),       rales    Cardiovascular (-) angina+ Peripheral Vascular Disease, +CHF and + DVT (10/16)   Rhythm:Regular Rate:Tachycardia  10/16 ECHO: EF 60-65%, mild MR   Neuro/Psych 10/28/15 CXR: small left pleural effusion with left basilar atelectasis or infiltrate. Central mild vascular congestion and mild perihilar interstitial prominence suspicious for mild pulmonary edema negative neurological ROS     GI/Hepatic Neg liver ROS, GERD  Medicated and Controlled,NG tube in place   Endo/Other  negative endocrine ROS  Renal/GU negative Renal ROS     Musculoskeletal  (+) Arthritis , Osteoarthritis,    Abdominal   Peds  Hematology  (+) Blood dyscrasia (Coumadin, INR 1.33, Hb 8.7), anemia ,   Anesthesia Other Findings   Reproductive/Obstetrics                            Anesthesia Physical Anesthesia Plan  ASA: IV  Anesthesia Plan: MAC   Post-op Pain Management:    Induction:   Airway Management Planned: Mask and Natural Airway  Additional Equipment:   Intra-op Plan:   Post-operative Plan:   Informed Consent: I have reviewed the patients History and Physical, chart, labs and discussed the procedure including the risks, benefits and alternatives for the proposed anesthesia with the patient or authorized representative who has indicated his/her understanding and acceptance.   Consent reviewed with POA and Dental advisory given  Plan  Discussed with: CRNA and Surgeon  Anesthesia Plan Comments: (Plan routine monitors, MAC Daughter understands no DNR in procedural area)        Anesthesia Quick Evaluation

## 2015-10-31 NOTE — Consult Note (Signed)
   [facility] CM Inpatient Consult   10/31/2015  OSWIN ELLICK Sep 06, 1924 [id number] Notified of hospital status. Patient was previously active with [facility].  Upon chart review, it has been noted that she will transition to comfort care per progress notes.  Will continue to monitor for changes related to needs for post hospital [facility] Care Management.  For questions, please contact: Natividad Brood, RN BSN [facility] Liaison  [phone] business mobile phone

## 2015-10-31 NOTE — Progress Notes (Signed)
Nutrition Brief Note  Chart reviewed. Pt now transitioning to comfort care.  No further nutrition interventions warranted at this time.  Please re-consult as needed.   Sumedh Shinsato A. Sonnet Rizor, RD, LDN, CDE Pager: [phone] After hours Pager: [phone]  

## 2015-10-31 NOTE — Progress Notes (Signed)
Colonoscopy shows a obstructing ascending colon mass. Discussed with the patient and 2 daughter's about surgical options which would be a diverting ileostomy.  They wish to forego surgery and move towards palliative care either in the hospital or at home.  I have updated the family medicine team.  I also spoke with [facility] with palliative care medicine who will see the patient within a 1/2 hour or so to discuss options.  Please do not hesitate to contact surgery with further assistance or questions.  Haneen Bernales, ANP-BC

## 2015-10-31 NOTE — Consult Note (Signed)
Consultation Note Date: 10/30/2015   Patient Name: Caroline Lewis  DOB: 07/01/1924  MRN: [id number]  Age / Sex: 81 y.o., female  PCP: Zenia Resides, MD Referring Physician: Dickie La, MD  Reason for Consultation: Establishing goals of care, Non pain symptom management, Pain control, Psychosocial/spiritual support and Terminal Care    Clinical Assessment/Narrative:  81 y.o. female presenting with nausea, vomiting and intermittent sharp abdominal pain. PMH is significant for IBS, RA, constipation, GERD, PVD, postmenopausal bleeding, hiatal hernia, and recent DVT started on coumadin. Small bowel obstruction: Seen on CT abdomen/pelvis. Also with colonic mass concerning for neoplasm, confirmed on colonoscopy.  Patient and family verbalize desire to focus on comfort, no further life prolonging measures   This NP Wadie Lessen reviewed medical records, received report from team, assessed the patient and then meet at the patient's bedside along with her two daughters, Caroline Lewis and Caroline Lewis   to discuss diagnosis prognosis, GOC, EOL wishes disposition and options.  A discussion was had today regarding advanced directives.  Concepts specific to code status, artifical feeding and hydration, continued IV antibiotics and rehospitalization was had.  The difference between a aggressive medical intervention path  and a palliative comfort care path for this patient at this time was had.  Values and goals of care important to patient and family were attempted to be elicited.  Concept of Hospice and Palliative Care were discussed  Natural trajectory and expectations at EOL were discussed.  Questions and concerns addressed.   Family encouraged to call with questions or concerns.  PMT will continue to support holistically.    SUMMARY OF RECOMMENDATIONS  -Shift to full comfort care, originally goal was to get patient home today with  hospice  but considering the patient's rapid decline plan is to monitor patient here in the hospital and re-evaluate in the morning.   Code Status/Advance Care Planning:  DNR      Code Status Orders        Start     Ordered   10/26/15 0202  Do not attempt resuscitation (DNR)   Continuous    Question Answer Comment  In the event of cardiac or respiratory ARREST Do not call a "code blue"   In the event of cardiac or respiratory ARREST Do not perform Intubation, CPR, defibrillation or ACLS   In the event of cardiac or respiratory ARREST Use medication by any route, position, wound care, and other measures to relive pain and suffering. May use oxygen, suction and manual treatment of airway obstruction as needed for comfort.      10/26/15 0201    Advance Directive Documentation        Most Recent Value   Type of Advance Directive  Living will   Pre-existing out of facility DNR order (yellow form or pink MOST form)     "MOST" Form in Place?        Other Directives:Living Will  Symptom Management:    Dyspnea: Initiate Morphine  Gtt as prescribed with bolus and titration orders  Agitation: Ativan 1 mg IV every 4 hrs prn  Terminal secretion: Robinul as prescribed  Palliative Prophylaxis:    Frequent Pain Assessment, Oral Care and Turn Reposition  Additional Recommendations (Limitations, Scope, Preferences):  Full Comfort Care   Psycho-social/Spiritual:  Support System: Strong Desire for further Chaplaincy support:no-declined Additional Recommendations: Education on Hospice and Grief/Bereavement Support  Prognosis: Hours - Days  Discharge Planning: Anticipated Hospital Death   Chief Complaint/ Primary Diagnoses: Present on Admission:  .  Small bowel obstruction ([place])  I have reviewed the medical record, interviewed the patient and family, and examined the patient. The following aspects are pertinent.  Past Medical History  Diagnosis Date  . Rheumatoid  arthritis(714.0)   . IBS (irritable bowel syndrome)   . Constipation   . Allergy     seasonal  . Osteoporosis   . Sciatica   . Peripheral vascular disease ([place])     "beyond help"  . Pneumonia 05/25/12; 2010   Social History   Social History  . Marital Status: Widowed    Spouse Name: N/A  . Number of Children: 4  . Years of Education: N/A   Occupational History  . retired Network engineer    Social History Main Topics  . Smoking status: Former Smoker -- 0.10 packs/day for 50 years    Types: Cigarettes    Quit date: 11/26/1987  . Smokeless tobacco: Never Used  . Alcohol Use: No  . Drug Use: No  . Sexual Activity: No   Other Topics Concern  . None   Social History Narrative   Family History  Problem Relation Age of Onset  . Lung cancer Sister   . Uterine cancer Sister   . Colon cancer Neg Hx   . Heart disease Father   . GI problems Son   . Irritable bowel syndrome Son    Scheduled Meds: . antiseptic oral rinse  7 mL Mouth Rinse q12n4p  . chlorhexidine  15 mL Mouth Rinse BID  . enoxaparin (LOVENOX) injection  1 mg/kg Subcutaneous Q12H  . glycopyrrolate  0.4 mg Intravenous Q4H  . insulin aspart  0-15 Units Subcutaneous 6 times per day  . LORazepam  1 mg Intravenous Once  . piperacillin-tazobactam (ZOSYN)  IV  3.375 g Intravenous 3 times per day  . sodium chloride  10-40 mL Intracatheter Q12H  . sodium phosphate  Dextrose 5% IVPB  20 mmol Intravenous Once  . vancomycin  500 mg Intravenous Q24H   Continuous Infusions: . sodium chloride    . sodium chloride 10 mL/hr at 10/30/15 2158  . dextrose 5 % and 0.45 % NaCl with KCl 20 mEq/L Stopped (10/30/15 1807)  . Marland KitchenTPN (CLINIMIX-E) Adult 55 mL/hr at 10/30/15 1836   And  . fat emulsion 240 mL (10/30/15 1836)  . Marland KitchenTPN (CLINIMIX-E) Adult     And  . fat emulsion     PRN Meds:.acetaminophen **OR** acetaminophen, benzonatate, menthol-cetylpyridinium, morphine injection, promethazine, sodium chloride Medications Prior to  Admission:  Prior to Admission medications   Medication Sig Start Date End Date Taking? Authorizing Provider  docusate sodium (ENEMEEZ) 283 MG enema Place 1 enema (283 mg total) rectally daily. Patient taking differently: Place 1 enema rectally daily as needed for moderate constipation.  09/06/15  Yes Archie Patten, MD  fesoterodine (TOVIAZ) 8 MG TB24 tablet Take 1 tablet (8 mg total) by mouth daily. 10/12/15  Yes Zenia Resides, MD  folic acid (FOLVITE) 1 MG tablet Take 1 mg by mouth daily.   Yes Historical Provider, MD  methotrexate (RHEUMATREX) 2.5 MG tablet Take 20 mg by mouth once a week. Caution:Chemotherapy. Protect from light. Take on Fridays   Yes Historical Provider, MD  omeprazole (PRILOSEC) 40 MG capsule Take 1 capsule (40 mg total) by mouth daily. 09/19/15  Yes Vivi Barrack, MD  sennosides-docusate sodium (SENOKOT-S) 8.6-50 MG tablet Take 1 tablet by mouth daily as needed for constipation.   Yes Historical Provider, MD  warfarin (COUMADIN) 5 MG tablet Take  1 tablet (5 mg total) by mouth one time only at 6 PM. Patient taking differently: Take 2.5 mg by mouth daily at 6 PM. HOLD DOSE ON Monday'S EACH WEEK-UNTIL NEXT INR CHECK 09/25/15  Yes Mercy Riding, MD  ferrous sulfate 325 (65 FE) MG tablet Take 1 tablet (325 mg total) by mouth 3 (three) times daily with meals. 09/19/15   Vivi Barrack, MD  HYDROcodone-acetaminophen (NORCO) 10-325 MG tablet Take 1 tablet by mouth every 8 (eight) hours as needed for moderate pain. Patient not taking: Reported on 09/20/2015 09/20/15   Zenia Resides, MD  magnesium citrate SOLN Take 148 mLs (0.5 Bottles total) by mouth once. Patient not taking: Reported on 09/20/2015 09/06/15   Archie Patten, MD  ondansetron (ZOFRAN) 4 MG tablet Take 1 tablet (4 mg total) by mouth every 8 (eight) hours as needed for nausea or vomiting. Patient not taking: Reported on 10/11/2015 09/19/15   Vivi Barrack, MD  Simethicone 80 MG TABS Take 1 tablet (80 mg  total) by mouth 4 (four) times daily -  before meals and at bedtime. Patient not taking: Reported on 09/20/2015 09/06/15   Archie Patten, MD   Allergies  Allergen Reactions  . Fosamax [Alendronate Sodium] Other (See Comments)    It affected my IBS    Review of Systems  Unable to perform ROS   Physical Exam  Constitutional: She appears listless. She appears cachectic. She appears distressed.  HENT:  Audible throat secretions  Cardiovascular: S1 normal and S2 normal.  Tachycardia present.   Respiratory: Accessory muscle usage present. Tachypnea noted. She is in respiratory distress. She has decreased breath sounds in the right lower field and the left lower field.  Neurological: She appears listless.  Skin: Skin is warm and dry.    Vital Signs: BP 137/79 mmHg  Pulse 121  Temp(Src) 97.8 F (36.6 C) (Oral)  Resp 22  Ht 5' (1.524 m)  Wt 44.453 kg (98 lb)  BMI 19.14 kg/m2  SpO2 92%  SpO2: SpO2: 92 % O2 Device:SpO2: 92 % O2 Flow Rate: .O2 Flow Rate (L/min): 4 L/min  IO: Intake/output summary:  Intake/Output Summary (Last 24 hours) at 11/25/2015 1427 Last data filed at 10/27/2015 1406  Gross per 24 hour  Intake 2775.58 ml  Output    400 ml  Net 2375.58 ml    LBM: Last BM Date: 10/28/15 Baseline Weight: Weight: 48.988 kg (108 lb) Most recent weight: Weight: 44.453 kg (98 lb)      Palliative Assessment/Data:  Flowsheet Rows        Most Recent Value   Intake Tab    Referral Department  -- [facility]]   Unit at Time of Referral  Med/Surg Unit   Palliative Care Primary Diagnosis  Cancer   Date Notified  11/14/2015   Clinical Assessment    Psychosocial & Spiritual Assessment    Palliative Care Outcomes       Additional Data Reviewed:  CBC:    Component Value Date/Time   WBC 20.3* 10/30/2015 0540   HGB 8.7* 10/30/2015 0540   HGB 10.5* 10/05/2015 1128   HCT 27.5* 10/30/2015 0540   PLT 246 10/30/2015 0540   MCV 83.1 10/30/2015 0540   NEUTROABS 18.5*  10/30/2015 0540   LYMPHSABS 0.6* 10/30/2015 0540   MONOABS 1.2* 10/30/2015 0540   EOSABS 0.0 10/30/2015 0540   BASOSABS 0.0 10/30/2015 0540   Comprehensive Metabolic Panel:    Component Value Date/Time   NA 127*  11/14/2015 0545   K 4.0 11/06/2015 0545   CL 98* 11/07/2015 0545   CO2 25 11/25/2015 0545   BUN 16 10/30/2015 0545   CREATININE 0.40* 11/23/2015 0545   CREATININE 0.58* 09/19/2015 1446   GLUCOSE 146* 10/30/2015 0545   CALCIUM 7.3* 10/28/2015 0545   AST 19 10/30/2015 0540   ALT 10* 10/30/2015 0540   ALKPHOS 61 10/30/2015 0540   BILITOT 0.2* 10/30/2015 0540   PROT 4.8* 10/30/2015 0540   ALBUMIN 1.7* 10/30/2015 0540   Discussed with Dr Andria Frames and Dr Hulen Skains  Time In: 1300 Time Out: 1430 Time Total: 90 min Greater than 50%  of this time was spent counseling and coordinating care related to the above assessment and plan.  Signed by: Wadie Lessen, NP  Knox Royalty, NP  11/24/2015, 2:27 PM  Please contact Palliative Medicine Team phone at [phone] for questions and concerns.

## 2015-10-31 NOTE — Progress Notes (Signed)
Family Medicine Teaching Service Daily Progress Note Intern Pager: [phone]  Patient name: Caroline Lewis Medical record number: [id number] Date of birth: 1924-09-15 Age: 81 y.o. Gender: female  Primary Care Provider: Zigmund Gottron, MD Consultants: Gen Surg, GI  Code Status: DNR  Pt Overview and Major Events to Date:  11/30: Admitted for SBO   Assessment and Plan: CHIANNA Lewis is a 81 y.o. female presenting with nausea, vomiting and intermittent sharp abdominal pain. PMH is significant for IBS, RA, constipation, GERD, PVD, postmenopausal bleeding, hiatal hernia, and recent DVT started on coumadin.   Small bowel obstruction: Seen on CT abdomen/pelvis. Also with colonic mass concerning for neoplasm as possible source of inflammation that could have caused obstruction. NG tube placed under fluoro.  KUB from 12/2 demonstrates improvement in bowel obstruction without free air. KUB from 12/4 does not demonstrate dilated bowel loops, but stool burden still appreciated on imaging. CEA WNL at 3.0.   - General surgery consult; appreciate recommendations -patient to have colonoscopy with biopsy of colonic mass this AM  - NPO. IVFs at 90 ml/hr.  - Continue NGT for decompression  - Fleets enema BID - Tylenol prn for pain - Monitor I/Os -flagyl to treat for possible diverticulitis -palliative care consult placed today; family and patient willing to discuss goals of care, currently awaiting biopsy from colonoscopy before deciding definitive tx plan   SIRS: Patient continues to have a  leukocytosis. Isolated fever to 100.7 on AM of 12/5. Normal lactic acid at admission. No source of infection noted, possibly GI related? Family notes new onset of productive cough on 12/5. CXR showed possible LLL infiltrate. Patient on 4L O2 via [place] with appropriate O2 saturation.  - Monitor for fever -Day 5 of flagyl, cipro discontinued on 12/5 -Day 2 of Vancomycin and Zosyn  -blood cultures pending    Chest pain: Low suspicion for ACS given normal EKG, but troponin of 0.04 in ED, troponins stable. Suspect GERD given patient's history.  - IV protonix while NPO   Constipation: Chronic, has not had a bowel movement in 2 weeks, per family but small BMs with enemas. - ducolax suppository ordered prn - MiraLax 8oz per GI as above.   Recent Left DVT: INR 1.59 (12/2)  -Lovenox at treatment dose, holding home coumadin   Dysphagia: Thought to be due to candidal esophagitis at last admission.  - Ordered bedside SLP consult once eating.   Anemia of chronic disease: HgB 8.7.  During last admission, anemia panel significant for borderline low folate at 5.9, iron, ferretin and TIBC. - Holding home ferrous sulfate and folic acid as NPO.  - Consider IV iron due to her propensity towards constipation.  RA: Patient has decreased mobility 2/2 RA and gets around at home with an electric scooter.  - Patient takes weekly methotrexate 20 mg on Fridays. Will hold off on ordering as patient is still NPO.   LE Edema: Improved with treatment of DVT. HFpEF, with normal EF and grade 2 diastolic dysfunction on ECHO 11/28.  - Continue to monitor.   Protein calorie malnutrition: With excessive muscular atrophy and ongoing weight loss possibly related to neoplasm and/or inanition. Chronic constipation now with SBO likely contributing to nausea and feeling of fullness. Likely need to r/o neoplasm with colonoscopy.  - Patient has lost nearly 7 lbs since last office visit 09/28/15.  - Nutrition consult prior to discharge -TPN started on 12/1    FEN/GI: NPO. D51/2NS @ 90 ml/hr while NPO. TPN Prophylaxis: Lovenox  Disposition: Pending management of SBO and colonic mass   Subjective:  Per nursing note, patient had watery medium brown/green stools after fleet enema was administered yesterday evening. Daughter notes that patient had multiple BMs overnight. Patient resting comfortably this morning. Does not have  any complaints. Daughter states cough is improving.   Objective: Temp:  [97.8 F (36.6 C)-98.5 F (36.9 C)] 98.2 F (36.8 C) (12/06 0554) Pulse Rate:  [109-115] 109 (12/06 0554) Resp:  [16] 16 (12/06 0554) BP: (109-142)/(58-78) 142/78 mmHg (12/06 0554) SpO2:  [94 %-98 %] 94 % (12/06 0554) Physical Exam: General: cachetic elderly female resting in bed in NAD  Cardiovascular: Tachycardic, regular rhythm  Lungs: Diffuse rhonchi and crackles, poor air movement at the bases, cough appreciated   Abdomen: Hypoactive bowel sounds, distended, soft, no TTP, no masses appreciated, no guarding or rebound tenderness  MSK: 2+ pitting edema of the dorsum of the L foot, 1+ on the right.    Laboratory:  Recent Labs Lab 10/27/15 0500 10/29/15 1656 10/30/15 0540  WBC 19.1* 19.1* 20.3*  HGB 9.7* 9.1* 8.7*  HCT 31.6* 27.9* 27.5*  PLT 244 260 246    Recent Labs Lab 10/27/15 0500 10/29/15 1656 10/30/15 0540  NA 133* 126* 126*  K 3.6 3.9 4.1  CL 100* 98* 97*  CO2 26 24 23   BUN 10 13 15   CREATININE 0.53 0.46 0.50  CALCIUM 7.5* 7.3* 7.1*  PROT 4.6* 4.7* 4.8*  BILITOT 0.4 0.5 0.2*  ALKPHOS 67 67 61  ALT 10* 9* 10*  AST 20 16 19   GLUCOSE 177* 165* 197*     Imaging/Diagnostic Tests: Dg Chest 2 View  10/30/2015  CLINICAL DATA:  Persistent cough, congestion EXAM: CHEST  2 VIEW COMPARISON:  10/28/2015 FINDINGS: Cardiomediastinal silhouette is stable. There is small left pleural effusion with left basilar atelectasis or infiltrate. Central mild vascular congestion and mild perihilar interstitial prominence suspicious for mild pulmonary edema. NG tube in place. Right arm PICC line with tip in distal SVC. No pneumothorax. IMPRESSION: There is small left pleural effusion with left basilar atelectasis or infiltrate. Central mild vascular congestion and mild perihilar interstitial prominence suspicious for mild pulmonary edema. NG tube in place. Right arm PICC line with tip in distal SVC. No  pneumothorax. Electronically Signed   By: Lahoma Crocker M.D.   On: 10/30/2015 13:48   Dg Chest 2 View  10/21/2015  CLINICAL DATA:  Epigastric pain.  nausea and vomiting. EXAM: CHEST  2 VIEW COMPARISON:  09/20/2015 FINDINGS: Chronic interstitial lung disease again seen with bibasilar predominance. No evidence of acute infiltrate or pleural effusion. Heart size remains at the upper limits of normal and stable. IMPRESSION: Chronic basilar predominant interstitial lung disease. No acute findings. Electronically Signed   By: Earle Gell M.D.   On: 10/24/2015 20:30   Dg Abd 1 View  10/26/2015  CLINICAL DATA:  Nasogastric tube placed under fluoroscopy. Fluoro time 3 minutes and 42 seconds. EXAM: ABDOMEN - 1 VIEW COMPARISON:  CT, 10/24/2015 FINDINGS: Nasogastric tube passes below the diaphragm to curl within the mid to distal stomach, well positioned. IMPRESSION: Well-positioned nasogastric tube. Electronically Signed   By: Lajean Manes M.D.   On: 10/26/2015 14:54   Ct Abdomen Pelvis W Contrast  09/28/2015  CLINICAL DATA:  Generalized sharp abdominal pain. Nausea and vomiting. EXAM: CT ABDOMEN AND PELVIS WITH CONTRAST TECHNIQUE: Multidetector CT imaging of the abdomen and pelvis was performed using the standard protocol following bolus administration of intravenous contrast. CONTRAST:  50mL OMNIPAQUE IOHEXOL 300 MG/ML  SOLN COMPARISON:  07/22/2015 FINDINGS: Lower chest:  Bibasilar fibrosis.  Normal heart size. Hepatobiliary: Normal liver. Mild intrahepatic biliary ductal dilatation. Normal gallbladder. Pancreas: Normal. Spleen: Normal. Adrenals/Urinary Tract: Normal adrenal glands. No solid renal mass. No obstructive uropathy. No urolithiasis. Decompressed bladder. Stomach/Bowel: Moderate-sized hiatal hernia. Severe small bowel dilatation measuring up to 3.7 cm. The small bowel is diffusely dilated. There is dilatation of the ascending colon with an air-fluid level with a relative pointer transition in the mid  ascending colon where there is a mass slight soft tissue prominence. No pneumatosis, pneumoperitoneum or portal venous gas. Vascular/Lymphatic: Normal caliber abdominal aorta with atherosclerosis. Other: No fluid collection or hematoma. Musculoskeletal: Right total hip arthroplasty with resulting beam hardening artifact obscuring portions of the pelvis. Three cannulated screws transfixing the femoral neck. No acute osseous abnormality. No lytic or sclerotic osseous lesion. S-shaped scoliosis of the thoracolumbar spine. Severe degenerative disc disease throughout the thoracolumbar spine. Chronic T8, T9, T11, T12 and L1 vertebral body compression fractures. IMPRESSION: 1. High-grade small bowel obstruction with a transition point in the mid ascending colon. Masslike prominence in the mid ascending colon concerning for neoplasm. Recommend further evaluation with colonoscopy. Electronically Signed   By: Kathreen Devoid   On: 10/06/2015 23:55   Ir Fluoro Guide Cv Line Right  10/28/2015  CLINICAL DATA:  Obstructing colonic mass and need for parental nutrition. A right upper extremity PICC line placed 2 days ago has been inadvertently pulled out entirely by accident. EXAM: POWER PICC LINE PLACEMENT WITH ULTRASOUND AND FLUOROSCOPIC GUIDANCE FLUOROSCOPY TIME:  1 minutes and 42 seconds. PROCEDURE: The patient's daughter was advised of the possible risks and complications and agreed to undergo the procedure. The patient was then brought to the angiographic suite for the procedure. A time-out was performed prior to the procedure. The right arm was prepped with chlorhexidine, draped in the usual sterile fashion using maximum barrier technique (cap and mask, sterile gown, sterile gloves, large sterile sheet, hand hygiene and cutaneous antisepsis) and infiltrated locally with 1% Lidocaine. Ultrasound demonstrated patency of the right brachial vein, and this was documented with an image. Under real-time ultrasound guidance, this  vein was accessed with a 21 gauge micropuncture needle and image documentation was performed. A 0.018 wire was introduced in to the vein. Over this, a 5.0 [place] dual lumen power injectable PICC was advanced to the lower SVC/right atrial junction. Fluoroscopy during the procedure and fluoro spot radiograph confirms appropriate catheter position. The catheter was flushed and covered with a sterile dressing. Catheter length: 36 cm COMPLICATIONS: None IMPRESSION: Successful right arm power injectable PICC line placement with ultrasound and fluoroscopic guidance. The catheter is ready for use. Electronically Signed   By: Aletta Edouard M.D.   On: 10/28/2015 11:42   Ir Fluoro Guide Cv Line Right  10/26/2015  CLINICAL DATA:  81 year old female with small bowel obstruction in need for durable central venous access for hydration and nutrition. EXAM: IR RIGHT FLOURO GUIDE CV LINE; IR ULTRASOUND GUIDANCE VASC ACCESS RIGHT FLUOROSCOPY TIME:  2 minutes 16 seconds for a total of 8.4 mGy TECHNIQUE: The right arm was prepped with chlorhexidine, draped in the usual sterile fashion using maximum barrier technique (cap and mask, sterile gown, sterile gloves, large sterile sheet, hand hygiene and cutaneous antiseptic). Local anesthesia was attained by infiltration with 1% lidocaine. Ultrasound demonstrated patency of the right brachial vein, and this was documented with an image. Under real-time ultrasound guidance, this vein was accessed  with a 21 gauge micropuncture needle and image documentation was performed. The needle was exchanged over a guidewire for a peel-away sheath through which a 6 [place] dual lumen power injectable PICC was advanced, and positioned with its tip at the lower SVC/right atrial junction. Fluoroscopy during the procedure and fluoro spot radiograph confirms appropriate catheter position. The catheter was flushed, secured to the skin with Prolene sutures, and covered with a sterile dressing. COMPLICATIONS:  None.  The patient tolerated the procedure well. IMPRESSION: Successful placement of a right arm dual-lumen power injectable PICC with sonographic and fluoroscopic guidance. The catheter is ready for use. Signed, Criselda Peaches, MD [facility] Electronically Signed   By: Jacqulynn Cadet M.D.   On: 10/26/2015 17:40   Ir US Guide Vasc Access Right  10/28/2015  CLINICAL DATA:  Obstructing colonic mass and need for parental nutrition. A right upper extremity PICC line placed 2 days ago has been inadvertently pulled out entirely by accident. EXAM: POWER PICC LINE PLACEMENT WITH ULTRASOUND AND FLUOROSCOPIC GUIDANCE FLUOROSCOPY TIME:  1 minutes and 42 seconds. PROCEDURE: The patient's daughter was advised of the possible risks and complications and agreed to undergo the procedure. The patient was then brought to the angiographic suite for the procedure. A time-out was performed prior to the procedure. The right arm was prepped with chlorhexidine, draped in the usual sterile fashion using maximum barrier technique (cap and mask, sterile gown, sterile gloves, large sterile sheet, hand hygiene and cutaneous antisepsis) and infiltrated locally with 1% Lidocaine. Ultrasound demonstrated patency of the right brachial vein, and this was documented with an image. Under real-time ultrasound guidance, this vein was accessed with a 21 gauge micropuncture needle and image documentation was performed. A 0.018 wire was introduced in to the vein. Over this, a 5.0 [place] dual lumen power injectable PICC was advanced to the lower SVC/right atrial junction. Fluoroscopy during the procedure and fluoro spot radiograph confirms appropriate catheter position. The catheter was flushed and covered with a sterile dressing. Catheter length: 36 cm COMPLICATIONS: None IMPRESSION: Successful right arm power injectable PICC line placement with ultrasound and fluoroscopic guidance. The  catheter is ready for use. Electronically Signed   By: Aletta Edouard M.D.   On: 10/28/2015 11:42   Ir US Guide Vasc Access Right  10/26/2015  CLINICAL DATA:  81 year old female with small bowel obstruction in need for durable central venous access for hydration and nutrition. EXAM: IR RIGHT FLOURO GUIDE CV LINE; IR ULTRASOUND GUIDANCE VASC ACCESS RIGHT FLUOROSCOPY TIME:  2 minutes 16 seconds for a total of 8.4 mGy TECHNIQUE: The right arm was prepped with chlorhexidine, draped in the usual sterile fashion using maximum barrier technique (cap and mask, sterile gown, sterile gloves, large sterile sheet, hand hygiene and cutaneous antiseptic). Local anesthesia was attained by infiltration with 1% lidocaine. Ultrasound demonstrated patency of the right brachial vein, and this was documented with an image. Under real-time ultrasound guidance, this vein was accessed with a 21 gauge micropuncture needle and image documentation was performed. The needle was exchanged over a guidewire for a peel-away sheath through which a 6 [place] dual lumen power injectable PICC was advanced, and positioned with its tip at the lower SVC/right atrial junction. Fluoroscopy during the procedure and fluoro spot radiograph confirms appropriate catheter position. The catheter was flushed, secured to the skin with Prolene sutures, and covered with a sterile dressing. COMPLICATIONS: None.  The patient tolerated the procedure well. IMPRESSION: Successful placement of a  right arm dual-lumen power injectable PICC with sonographic and fluoroscopic guidance. The catheter is ready for use. Signed, Criselda Peaches, MD [facility] Radiology Electronically Signed   By: Jacqulynn Cadet M.D.   On: 10/26/2015 17:40   Dg Abd 2 Views  10/27/2015  CLINICAL DATA:  Large bowel obstruction EXAM: ABDOMEN - 2 VIEW COMPARISON:  CT 10/23/2015 FINDINGS: NG tube in the stomach with the tip near the pylorus.  Stomach decompressed. Improvement in dilated small bowel. Air-fluid levels are noted on the decubitus view. Much of the small bowel is fluid-filled on previous CT. No free air on the decubitus view. Contrast in the urinary bladder from recent CT. IMPRESSION: NG tube in the stomach. Improvement in bowel obstruction pattern. No free air. Electronically Signed   By: Franchot Gallo M.D.   On: 10/27/2015 08:43   Dg Abd Portable 1v  10/29/2015  CLINICAL DATA:  Small-bowel obstruction secondary to colonic mass. History of diverticulitis of ascending colon. EXAM: PORTABLE ABDOMEN - 1 VIEW COMPARISON:  Abdominal plain films dated 10/27/2015 and 10/26/2015. Comparison also made to CT dated 10/20/2015. FINDINGS: NG tube remains in place with tip well-positioned near the expected region of the pylorus. There are no dilated bowel loops identified on today's exam. No evidence of free intraperitoneal air. Degenerative changes again noted throughout the scoliotic thoracolumbar spine. No acute -appearing osseous abnormality. IMPRESSION: 1. Nasogastric tube well-positioned within the stomach with tip near the expected region of the pylorus. 2. No dilated bowel loops appreciated on today's exam. Electronically Signed   By: Franki Cabot M.D.   On: 10/29/2015 08:51   Dg [place]ison Bailey G Tube Plc W/fl-no Rad  10/26/2015  CLINICAL DATA:  NASO G TUBE PLACEMENT WITH FLUORO Fluoroscopy was utilized by the requesting physician.  No radiographic interpretation.    Nicolette Bang, DO 10/30/2015, 6:57 AM PGY-1, [place] Intern pager: [phone], text pages welcome

## 2015-10-31 NOTE — Progress Notes (Signed)
   10/30/2015 1700  Clinical Encounter Type  Visited With Patient;Patient and family together;Family  Visit Type Patient actively dying  Referral From Family;Nurse  Consult/Referral To Chaplain  Spiritual Encounters  Spiritual Needs Emotional;Grief support;Prayer (prayer shawl)  [place] consult for end of life; family requested prayer shawl and delivered by [facility]; [place] comforted family; spiritual, emotional and grief support offered; family requested prayer, offered by [facility]; [place] available if needed. Page [phone]  . 5:09 PM Gwynn Burly

## 2015-10-31 NOTE — Progress Notes (Signed)
Patient had watery medium, brown/green stools after fleet enema was administered.

## 2015-10-31 NOTE — Progress Notes (Signed)
Patient is actively dying.  She has worsening respiratory status, despite being started on broad spectrum antibiotics.  Post colonoscopy, which confirmed a mass (biopsy results pending), they had switched to comfort care and wanted to take her home.  I doubt that will happen.  Daughters are understandably upset.  Offered to answer questions.  They preferred to be alone with their mother.

## 2015-10-31 NOTE — Progress Notes (Signed)
Patient had another watery brown/green stools with intermittent oozing d/t ongoing bowel prep for colonoscopy.  Rate of bowel prep slowed down d/t distended abdomen and to be stopped by 8AM per MD's order for scheduled colonoscopy at 2pm.  Estimated 800 mL remaining from the 4000 mL polyethylene glycol-electrolytes solution by 8AM d/t slowed rate. Will endorse to dayshift RN to restart NGT at low intermittent suction after 8AM.

## 2015-10-31 NOTE — Op Note (Signed)
[facility] [place], [id number]   COLONOSCOPY PROCEDURE REPORT     EXAM DATE: Nov 05, 2015  PATIENT NAME:      Caroline Lewis, Caroline Lewis           MR #:      [id number]  BIRTHDATE:       11-03-1924      VISIT #:     [phone]  ATTENDING:     Clarene Essex, MD     STATUS:     inpatient ASSISTANT:      Monday, Judson Roch and Cristopher Estimable  INDICATIONS:  The patient is a 81 yr old female here for a colonoscopy due to an abnormal CT. PROCEDURE PERFORMED:     Colonoscopy with biopsy MEDICATIONS:     Propofol 35 mg IV  40 mg lidocaine ESTIMATED BLOOD LOSS:     None  CONSENT: The patient understands the risks and benefits of the procedure and understands that these risks include, but are not limited to: sedation, allergic reaction, infection, perforation and/or bleeding. Alternative means of evaluation and treatment include, among others: physical exam, x-rays, and/or surgical intervention. The patient elects to proceed with this endoscopic procedure.  DESCRIPTION OF PROCEDURE: During intra-op preparation period all mechanical & medical equipment was checked for proper function. Hand hygiene and appropriate measures for infection prevention was taken. After the risks, benefits and alternatives of the procedure were thoroughly explained, Informed consent was verified, confirmed and timeout was successfully executed by the treatment team. A digital exam revealed external hemorrhoids. The Pentax Ped Colon [id number] endoscope was introduced through the anus and advanced to theprobable ascending colon. the prep was poor. The instrument was then slowly withdrawn as the colon was fully examined.Estimated blood loss is zero unless otherwise noted in this procedure report.we did use very minimal abdominal pressure to assist with advancement The findings are recorded below      Retroflexion was not performed. The scope was then completely withdrawn from the  patient and the procedure terminated. SCOPE WITHDRAWAL TIME: see nurse's note fairly quickly due to poor prep and obstruction    ADVERSE EVENTS:      There were no immediate complications.  IMPRESSIONS:     1. Ascending mass seemingly obstructing status post few biopsies 2. Few diverticuli throughout 3. Fairly difficult exam due to prep and looping and tortuosity  RECOMMENDATIONS:     Will send biopsies stat and notify surgical team of findings RECALL:     as needed  _____________________________ Clarene Essex, MD eSigned:  Clarene Essex, MD 11/05/15 10:44 AM   cc:   CPT CODES: ICD CODES:  The ICD and CPT codes recommended by this software are interpretations from the data that the clinical staff has captured with the software.  The verification of the translation of this report to the ICD and CPT codes and modifiers is the sole responsibility of the health care institution and practicing physician where this report was generated.  [facility]. will not be held responsible for the validity of the ICD and CPT codes included on this report.  AMA assumes no liability for data contained or not contained herein. CPT is a Designer, television/film set of the [employer].

## 2015-10-31 NOTE — Progress Notes (Signed)
Patient ID: Caroline Lewis, female   DOB: 07/26/1924, 81 y.o.   MRN: [id number]     [facility]      [address] [place], [place] [address]    Phone: [phone] FAX: [phone]     Subjective: Prepped last night, watery stools.  NGT now back to suction.  Spoke with daughter.   Objective:  Vital signs:  Filed Vitals:   10/30/15 0600 10/30/15 1545 10/30/15 2102 11/18/2015 0554  BP:  109/58 123/67 142/78  Pulse: 118 115 113 109  Temp: 97.5 F (36.4 C) 97.8 F (36.6 C) 98.5 F (36.9 C) 98.2 F (36.8 C)  TempSrc: Axillary Axillary Axillary Oral  Resp: $Remo'16 16 16 16  'inhlD$ Height:      Weight:      SpO2: 96% 98% 96% 94%    Last BM Date: 10/28/15  Intake/Output   Yesterday:  12/05 0701 - 12/06 0700 In: 2611.9 [I.V.:364.7; IV Piggyback:703.3; TPN:1543.9] Out: 400 [Emesis/NG output:400] This shift: I/O last 3 completed shifts: In: 4086.9 [I.V.:894.7; IV Piggyback:803.3] Out: 400 [Emesis/NG output:400]   Physical Exam: General: Pt arousable. Abdomen: +BS, abdomen is soft, distended, tender     Problem List:   Active Problems:   Small bowel obstruction (HCC)   Large bowel obstruction (HCC)   Cachexia (HCC)   SBO (small bowel obstruction) (HCC)   Cough   Fever   Leukocytosis    Results:   Labs: Results for orders placed or performed during the hospital encounter of 10/10/2015 (from the past 48 hour(s))  Glucose, capillary     Status: Abnormal   Collection Time: 10/29/15 11:24 AM  Result Value Ref Range   Glucose-Capillary 169 (H) 65 - 99 mg/dL   Comment 1 Notify RN   Glucose, capillary     Status: Abnormal   Collection Time: 10/29/15  4:50 PM  Result Value Ref Range   Glucose-Capillary 164 (H) 65 - 99 mg/dL   Comment 1 Notify RN   CBC     Status: Abnormal   Collection Time: 10/29/15  4:56 PM  Result Value Ref Range   WBC 19.1 (H) 4.0 - 10.5 K/uL   RBC 3.40 (L) 3.87 - 5.11 MIL/uL   Hemoglobin 9.1 (L)  12.0 - 15.0 g/dL   HCT 27.9 (L) 36.0 - 46.0 %   MCV 82.1 78.0 - 100.0 fL   MCH 26.8 26.0 - 34.0 pg   MCHC 32.6 30.0 - 36.0 g/dL   RDW 25.2 (H) 11.5 - 15.5 %   Platelets 260 150 - 400 K/uL  Comprehensive metabolic panel     Status: Abnormal   Collection Time: 10/29/15  4:56 PM  Result Value Ref Range   Sodium 126 (L) 135 - 145 mmol/L   Potassium 3.9 3.5 - 5.1 mmol/L   Chloride 98 (L) 101 - 111 mmol/L   CO2 24 22 - 32 mmol/L   Glucose, Bld 165 (H) 65 - 99 mg/dL   BUN 13 6 - 20 mg/dL   Creatinine, Ser 0.46 0.44 - 1.00 mg/dL   Calcium 7.3 (L) 8.9 - 10.3 mg/dL   Total Protein 4.7 (L) 6.5 - 8.1 g/dL   Albumin 1.7 (L) 3.5 - 5.0 g/dL   AST 16 15 - 41 U/L   ALT 9 (L) 14 - 54 U/L   Alkaline Phosphatase 67 38 - 126 U/L   Total Bilirubin 0.5 0.3 - 1.2 mg/dL   GFR calc non Af Amer >60 >60  mL/min   GFR calc Af Amer >60 >60 mL/min    Comment: (NOTE) The eGFR has been calculated using the CKD EPI equation. This calculation has not been validated in all clinical situations. eGFR's persistently <60 mL/min signify possible Chronic Kidney Disease.    Anion gap 4 (L) 5 - 15  Protime-INR     Status: Abnormal   Collection Time: 10/29/15  4:56 PM  Result Value Ref Range   Prothrombin Time 16.6 (H) 11.6 - 15.2 seconds   INR 1.33 0.00 - 1.49  Glucose, capillary     Status: Abnormal   Collection Time: 10/29/15  8:06 PM  Result Value Ref Range   Glucose-Capillary 161 (H) 65 - 99 mg/dL   Comment 1 Notify RN    Comment 2 Document in Chart   Glucose, capillary     Status: Abnormal   Collection Time: 10/30/15 12:06 AM  Result Value Ref Range   Glucose-Capillary 181 (H) 65 - 99 mg/dL   Comment 1 Notify RN    Comment 2 Document in Chart   Glucose, capillary     Status: Abnormal   Collection Time: 10/30/15  4:08 AM  Result Value Ref Range   Glucose-Capillary 173 (H) 65 - 99 mg/dL   Comment 1 Notify RN    Comment 2 Document in Chart   Comprehensive metabolic panel     Status: Abnormal    Collection Time: 10/30/15  5:40 AM  Result Value Ref Range   Sodium 126 (L) 135 - 145 mmol/L   Potassium 4.1 3.5 - 5.1 mmol/L   Chloride 97 (L) 101 - 111 mmol/L   CO2 23 22 - 32 mmol/L   Glucose, Bld 197 (H) 65 - 99 mg/dL   BUN 15 6 - 20 mg/dL   Creatinine, Ser 4.03 0.44 - 1.00 mg/dL   Calcium 7.1 (L) 8.9 - 10.3 mg/dL   Total Protein 4.8 (L) 6.5 - 8.1 g/dL   Albumin 1.7 (L) 3.5 - 5.0 g/dL   AST 19 15 - 41 U/L   ALT 10 (L) 14 - 54 U/L   Alkaline Phosphatase 61 38 - 126 U/L   Total Bilirubin 0.2 (L) 0.3 - 1.2 mg/dL   GFR calc non Af Amer >60 >60 mL/min   GFR calc Af Amer >60 >60 mL/min    Comment: (NOTE) The eGFR has been calculated using the CKD EPI equation. This calculation has not been validated in all clinical situations. eGFR's persistently <60 mL/min signify possible Chronic Kidney Disease.    Anion gap 6 5 - 15  Magnesium     Status: None   Collection Time: 10/30/15  5:40 AM  Result Value Ref Range   Magnesium 1.7 1.7 - 2.4 mg/dL  Phosphorus     Status: Abnormal   Collection Time: 10/30/15  5:40 AM  Result Value Ref Range   Phosphorus 2.2 (L) 2.5 - 4.6 mg/dL  CBC     Status: Abnormal   Collection Time: 10/30/15  5:40 AM  Result Value Ref Range   WBC 20.3 (H) 4.0 - 10.5 K/uL   RBC 3.31 (L) 3.87 - 5.11 MIL/uL   Hemoglobin 8.7 (L) 12.0 - 15.0 g/dL   HCT 74.2 (L) 74.4 - 72.8 %   MCV 83.1 78.0 - 100.0 fL   MCH 26.3 26.0 - 34.0 pg   MCHC 31.6 30.0 - 36.0 g/dL   RDW 91.8 (H) 12.9 - 08.0 %   Platelets 246 150 - 400 K/uL  Differential  Status: Abnormal   Collection Time: 10/30/15  5:40 AM  Result Value Ref Range   Neutrophils Relative % 91 %   Lymphocytes Relative 3 %   Monocytes Relative 6 %   Eosinophils Relative 0 %   Basophils Relative 0 %   Neutro Abs 18.5 (H) 1.7 - 7.7 K/uL   Lymphs Abs 0.6 (L) 0.7 - 4.0 K/uL   Monocytes Absolute 1.2 (H) 0.1 - 1.0 K/uL   Eosinophils Absolute 0.0 0.0 - 0.7 K/uL   Basophils Absolute 0.0 0.0 - 0.1 K/uL   RBC Morphology  POLYCHROMASIA PRESENT     Comment: ELLIPTOCYTES   WBC Morphology ATYPICAL LYMPHOCYTES     Comment: TOXIC GRANULATION  Prealbumin     Status: Abnormal   Collection Time: 10/30/15  5:40 AM  Result Value Ref Range   Prealbumin 3.3 (L) 18 - 38 mg/dL  Triglycerides     Status: None   Collection Time: 10/30/15  5:40 AM  Result Value Ref Range   Triglycerides 41 <150 mg/dL  Glucose, capillary     Status: Abnormal   Collection Time: 10/30/15  7:26 AM  Result Value Ref Range   Glucose-Capillary 153 (H) 65 - 99 mg/dL   Comment 1 Notify RN   Glucose, capillary     Status: Abnormal   Collection Time: 10/30/15 11:12 AM  Result Value Ref Range   Glucose-Capillary 176 (H) 65 - 99 mg/dL   Comment 1 Notify RN   Glucose, capillary     Status: Abnormal   Collection Time: 10/30/15  3:43 PM  Result Value Ref Range   Glucose-Capillary 144 (H) 65 - 99 mg/dL   Comment 1 Notify RN   Glucose, capillary     Status: Abnormal   Collection Time: 10/30/15  9:08 PM  Result Value Ref Range   Glucose-Capillary 162 (H) 65 - 99 mg/dL  Glucose, capillary     Status: Abnormal   Collection Time: 11/23/2015 12:09 AM  Result Value Ref Range   Glucose-Capillary 145 (H) 65 - 99 mg/dL  Glucose, capillary     Status: Abnormal   Collection Time: 11/10/2015  3:26 AM  Result Value Ref Range   Glucose-Capillary 157 (H) 65 - 99 mg/dL  Magnesium     Status: None   Collection Time: 11/15/2015  5:45 AM  Result Value Ref Range   Magnesium 1.9 1.7 - 2.4 mg/dL  Phosphorus     Status: Abnormal   Collection Time: 11/15/2015  5:45 AM  Result Value Ref Range   Phosphorus 2.0 (L) 2.5 - 4.6 mg/dL  Basic metabolic panel     Status: Abnormal   Collection Time: 11/10/2015  5:45 AM  Result Value Ref Range   Sodium 127 (L) 135 - 145 mmol/L   Potassium 4.0 3.5 - 5.1 mmol/L   Chloride 98 (L) 101 - 111 mmol/L   CO2 25 22 - 32 mmol/L   Glucose, Bld 146 (H) 65 - 99 mg/dL   BUN 16 6 - 20 mg/dL   Creatinine, Ser 0.40 (L) 0.44 - 1.00 mg/dL    Calcium 7.3 (L) 8.9 - 10.3 mg/dL   GFR calc non Af Amer >60 >60 mL/min   GFR calc Af Amer >60 >60 mL/min    Comment: (NOTE) The eGFR has been calculated using the CKD EPI equation. This calculation has not been validated in all clinical situations. eGFR's persistently <60 mL/min signify possible Chronic Kidney Disease.    Anion gap 4 (L) 5 - 15  Glucose, capillary     Status: Abnormal   Collection Time: 10/30/2015  7:49 AM  Result Value Ref Range   Glucose-Capillary 159 (H) 65 - 99 mg/dL   Comment 1 Notify RN     Imaging / Studies: Dg Chest 2 View  10/30/2015  CLINICAL DATA:  Persistent cough, congestion EXAM: CHEST  2 VIEW COMPARISON:  10/28/2015 FINDINGS: Cardiomediastinal silhouette is stable. There is small left pleural effusion with left basilar atelectasis or infiltrate. Central mild vascular congestion and mild perihilar interstitial prominence suspicious for mild pulmonary edema. NG tube in place. Right arm PICC line with tip in distal SVC. No pneumothorax. IMPRESSION: There is small left pleural effusion with left basilar atelectasis or infiltrate. Central mild vascular congestion and mild perihilar interstitial prominence suspicious for mild pulmonary edema. NG tube in place. Right arm PICC line with tip in distal SVC. No pneumothorax. Electronically Signed   By: Lahoma Crocker M.D.   On: 10/30/2015 13:48    Medications / Allergies:  Scheduled Meds: . antiseptic oral rinse  7 mL Mouth Rinse q12n4p  . chlorhexidine  15 mL Mouth Rinse BID  . enoxaparin (LOVENOX) injection  1 mg/kg Subcutaneous Q12H  . insulin aspart  0-15 Units Subcutaneous 6 times per day  . metronidazole  500 mg Intravenous Q8H  . pantoprazole (PROTONIX) IV  40 mg Intravenous Q24H  . piperacillin-tazobactam (ZOSYN)  IV  3.375 g Intravenous 3 times per day  . sodium chloride  10-40 mL Intracatheter Q12H  . sodium phosphate  1 enema Rectal BID  . sodium phosphate  Dextrose 5% IVPB  20 mmol Intravenous Once  .  vancomycin  500 mg Intravenous Q24H   Continuous Infusions: . sodium chloride    . sodium chloride 10 mL/hr at 10/30/15 2158  . dextrose 5 % and 0.45 % NaCl with KCl 20 mEq/L Stopped (10/30/15 1807)  . Marland KitchenTPN (CLINIMIX-E) Adult 55 mL/hr at 10/30/15 1836   And  . fat emulsion 240 mL (10/30/15 1836)  . Marland KitchenTPN (CLINIMIX-E) Adult     And  . fat emulsion     PRN Meds:.acetaminophen **OR** acetaminophen, benzonatate, bisacodyl, menthol-cetylpyridinium, morphine injection, promethazine, sodium chloride  Antibiotics: Anti-infectives    Start     Dose/Rate Route Frequency Ordered Stop   10/30/15 1800  vancomycin (VANCOCIN) 500 mg in sodium chloride 0.9 % 100 mL IVPB     500 mg 100 mL/hr over 60 Minutes Intravenous Every 24 hours 10/30/15 1743     10/30/15 1800  piperacillin-tazobactam (ZOSYN) IVPB 3.375 g     3.375 g 12.5 mL/hr over 240 Minutes Intravenous 3 times per day 10/30/15 1743     10/28/15 1500  ciprofloxacin (CIPRO) IVPB 200 mg  Status:  Discontinued     200 mg 100 mL/hr over 60 Minutes Intravenous Every 12 hours 10/28/15 1046 10/30/15 1716   10/27/15 1500  ciprofloxacin (CIPRO) IVPB 400 mg  Status:  Discontinued     400 mg 200 mL/hr over 60 Minutes Intravenous Every 12 hours 10/27/15 1458 10/28/15 1045   10/27/15 1500  metroNIDAZOLE (FLAGYL) IVPB 500 mg     500 mg 100 mL/hr over 60 Minutes Intravenous Every 8 hours 10/27/15 1458          Assessment/Plan 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  -CEA 3 -colonoscopy per GI  ID-zosyn gives adequate anaerobic coverage, consider stopping flagyl PCM-pre-albumin 3.3,  TPN DVT(Dx 08/27/15)-coumadin on hold, lovenox  Dispo-colonoscopy this AM  Erby Pian, ANP-BC [facility] Pager [phone](7A-4:30P) For consults and floor pages call [phone](7A-4:30P)  10/30/2015 8:24 AM

## 2015-10-31 NOTE — Anesthesia Postprocedure Evaluation (Signed)
Anesthesia Post Note  Patient: Caroline Lewis  Procedure(s) Performed: Procedure(s) (LRB): COLONOSCOPY WITH PROPOFOL (N/A)  Patient location during evaluation: Endoscopy Anesthesia Type: MAC Level of consciousness: awake and alert and patient cooperative Pain management: pain level controlled Vital Signs Assessment: post-procedure vital signs reviewed and stable Respiratory status: spontaneous breathing, respiratory function stable and patient connected to nasal cannula oxygen (rales persist) Cardiovascular status: stable : NG remains in place. Anesthetic complications: no    Last Vitals:  Filed Vitals:   11/09/2015 0900 11/18/2015 1042  BP: 126/69 139/78  Pulse:  115  Temp: 36.6 C   Resp: 27 30    Last Pain:  Filed Vitals:   10/26/2015 1051  PainSc: 0-No pain                 Kang Ishida,E. [name]

## 2015-10-31 NOTE — Progress Notes (Signed)
Caroline Lewis 9:31 AM  Subjective: Patient without any new complaints and did move her bowels multiple times and her case was rediscussed with her daughter  Objective: Vital signs stable afebrile lethargic but answering questions appropriately exam pertinent for being slightly distended but soft nontender no guarding or rebound occasional bowel sounds no new labs  Assessment: Abnormal CT  Plan: Okay to proceed with colonoscopy today with anesthesia assistance  [facility]  Pager [phone] After 5PM or if no answer call [phone]

## 2015-10-31 NOTE — Transfer of Care (Signed)
Immediate Anesthesia Transfer of Care Note  Patient: Caroline Lewis  Procedure(s) Performed: Procedure(s): COLONOSCOPY WITH PROPOFOL (N/A)  Patient Location: PACU  Anesthesia Type:MAC  Level of Consciousness: lethargic (baseline)  Airway & Oxygen Therapy: Patient Spontanous Breathing and Patient connected to nasal cannula oxygen  Post-op Assessment: Report given to RN and Post -op Vital signs reviewed and stable  Post vital signs: Reviewed and stable  Last Vitals:  Filed Vitals:   10/29/2015 0554 11/10/2015 0900  BP: 142/78 126/69  Pulse: 109   Temp: 36.8 C 36.6 C  Resp: 16 27    Complications: No apparent anesthesia complications

## 2015-11-01 ENCOUNTER — Encounter (HOSPITAL_COMMUNITY): Payer: Self-pay | Admitting: Gastroenterology

## 2015-11-01 DIAGNOSIS — Z515 Encounter for palliative care: Secondary | ICD-10-CM | POA: Insufficient documentation

## 2015-11-02 ENCOUNTER — Other Ambulatory Visit: Payer: Self-pay | Admitting: *Deleted

## 2015-11-02 ENCOUNTER — Encounter: Payer: Self-pay | Admitting: *Deleted

## 2015-11-02 NOTE — Patient Outreach (Signed)
[facility]) Care Management  11/02/2015  Caroline Lewis 07-17-1924 [id number]   Assessment: Cancel transition of care Cancelled patient from transition of care follow-up. Reason: Patient deceased 11/22/2015.  Plan: Will close case. Will notify primary care provider of case closure.

## 2015-11-04 LAB — CULTURE, BLOOD (ROUTINE X 2)
CULTURE: NO GROWTH
CULTURE: NO GROWTH

## 2015-11-26 NOTE — Discharge Summary (Signed)
[facility] Death Summary  Patient name: Caroline Lewis Medical record number: [id number] Date of birth: 01-Apr-1924 Age: 82 y.o. Gender: female Date of Admission: 10/04/2015  Date of Discharge: 2015/11/21 Admitting Physician: Lupita Dawn, MD  Primary Care Provider: Zigmund Gottron, MD Consultants: GI, Surgery   Indication for Hospitalization: Small Bowel Obstruction   Disposition: Death   Brief Hospital Course:  Caroline Lewis is a 82 y.o. female with PMH significant for IBS, RA, chronic constipation, and recent DVT (started on coumadin). She presented with N/V and sharp abdominal pain. Found to have SBO on CT abdomen/pelvis. Additionally, a mass in the ascending colon was visualized concerning for neoplasm as possible source of inflammation that could have caused obstruction. Patient was admitted for management of SBO. GI and Gen Surgery were consulted and involved in patient's care throughout her hospitalization.  Patient was made NPO and TPN was started due to malnutrition and low pre-albumin. Slow decompression was started via NGT. Patient had colonoscopy in 2012 that was only remarkable for diverticulosis. Started Cipro/Flagyl (12/2) empirically to treat possible diverticulitis as cause of symptoms or mass. GI began bowel clean out with Miralax via NG tube and fleet enemas. Patient did have some stool output and on serial abdominal exams distention slowly improved. On hospital day #5 (12/5), patient had an isolated fever with report of wet productive cough, and rhonchi on lung exam. Patient had been tachycardic with leukocytosis throughout hospital course but appeared non-toxic and with a normal lactic acid. At the onset of these new symptoms, a CXR was completed which showed possible LLL infiltrate. Antibiotic coverage was broadened to vancomycin/zosyn/flagyl and ciprofloxacin was discontinued.   Per patient and family's wishes, colonoscopy was  performed on 12/6. After colonoscopy, patient began to have severe respiratory decline with new O2 requirement. Family made decision to switch entirely to comfort care at this point.  Patient passed away at 10:58 on 2023/11/21.    Significant Procedures: Colonoscopy   Significant Labs and Imaging:   Recent Labs Lab 10/04/2015 1925 10/26/15 0740  WBC 19.1* 17.5*  HGB 12.0 10.0*  HCT 39.5 32.9*  PLT 273 220    Recent Labs Lab 10/17/2015 1925 10/26/15 0740 10/26/15 1210  NA 135 135  --   K 4.3 3.8  --   CL 101 104  --   CO2 23 22  --   GLUCOSE 154* 115*  --   BUN 12 11  --   CREATININE 0.64 0.48  --   CALCIUM 8.3* 7.5*  --   MG  --   --  1.6*  PHOS  --   --  3.1  ALKPHOS 91  --   --   AST 21  --   --   ALT 14  --   --   ALBUMIN 2.7*  --   --     Dg Chest 2 View  10/30/2015  CLINICAL DATA:  Persistent cough, congestion EXAM: CHEST  2 VIEW COMPARISON:  10/28/2015 FINDINGS: Cardiomediastinal silhouette is stable. There is small left pleural effusion with left basilar atelectasis or infiltrate. Central mild vascular congestion and mild perihilar interstitial prominence suspicious for mild pulmonary edema. NG tube in place. Right arm PICC line with tip in distal SVC. No pneumothorax. IMPRESSION: There is small left pleural effusion with left basilar atelectasis or infiltrate. Central mild vascular congestion and mild perihilar interstitial prominence suspicious for mild pulmonary edema. NG tube in place. Right arm PICC line with tip in  distal SVC. No pneumothorax. Electronically Signed   By: Lahoma Crocker M.D.   On: 10/30/2015 13:48   Dg Chest 2 View  09/29/2015  CLINICAL DATA:  Epigastric pain.  nausea and vomiting. EXAM: CHEST  2 VIEW COMPARISON:  09/20/2015 FINDINGS: Chronic interstitial lung disease again seen with bibasilar predominance. No evidence of acute infiltrate or pleural effusion. Heart size remains at the upper limits of normal and stable. IMPRESSION: Chronic basilar predominant  interstitial lung disease. No acute findings. Electronically Signed   By: Earle Gell M.D.   On: 10/13/2015 20:30   Dg Abd 1 View  10/26/2015  CLINICAL DATA:  Nasogastric tube placed under fluoroscopy. Fluoro time 3 minutes and 42 seconds. EXAM: ABDOMEN - 1 VIEW COMPARISON:  CT, 09/28/2015 FINDINGS: Nasogastric tube passes below the diaphragm to curl within the mid to distal stomach, well positioned. IMPRESSION: Well-positioned nasogastric tube. Electronically Signed   By: Lajean Manes M.D.   On: 10/26/2015 14:54   Ct Abdomen Pelvis W Contrast  10/05/2015  CLINICAL DATA:  Generalized sharp abdominal pain. Nausea and vomiting. EXAM: CT ABDOMEN AND PELVIS WITH CONTRAST TECHNIQUE: Multidetector CT imaging of the abdomen and pelvis was performed using the standard protocol following bolus administration of intravenous contrast. CONTRAST:  79mL OMNIPAQUE IOHEXOL 300 MG/ML  SOLN COMPARISON:  07/22/2015 FINDINGS: Lower chest:  Bibasilar fibrosis.  Normal heart size. Hepatobiliary: Normal liver. Mild intrahepatic biliary ductal dilatation. Normal gallbladder. Pancreas: Normal. Spleen: Normal. Adrenals/Urinary Tract: Normal adrenal glands. No solid renal mass. No obstructive uropathy. No urolithiasis. Decompressed bladder. Stomach/Bowel: Moderate-sized hiatal hernia. Severe small bowel dilatation measuring up to 3.7 cm. The small bowel is diffusely dilated. There is dilatation of the ascending colon with an air-fluid level with a relative pointer transition in the mid ascending colon where there is a mass slight soft tissue prominence. No pneumatosis, pneumoperitoneum or portal venous gas. Vascular/Lymphatic: Normal caliber abdominal aorta with atherosclerosis. Other: No fluid collection or hematoma. Musculoskeletal: Right total hip arthroplasty with resulting beam hardening artifact obscuring portions of the pelvis. Three cannulated screws transfixing the femoral neck. No acute osseous abnormality. No lytic or  sclerotic osseous lesion. S-shaped scoliosis of the thoracolumbar spine. Severe degenerative disc disease throughout the thoracolumbar spine. Chronic T8, T9, T11, T12 and L1 vertebral body compression fractures. IMPRESSION: 1. High-grade small bowel obstruction with a transition point in the mid ascending colon. Masslike prominence in the mid ascending colon concerning for neoplasm. Recommend further evaluation with colonoscopy. Electronically Signed   By: Kathreen Devoid   On: 10/12/2015 23:55   Ir Fluoro Guide Cv Line Right  10/28/2015  CLINICAL DATA:  Obstructing colonic mass and need for parental nutrition. A right upper extremity PICC line placed 2 days ago has been inadvertently pulled out entirely by accident. EXAM: POWER PICC LINE PLACEMENT WITH ULTRASOUND AND FLUOROSCOPIC GUIDANCE FLUOROSCOPY TIME:  1 minutes and 42 seconds. PROCEDURE: The patient's daughter was advised of the possible risks and complications and agreed to undergo the procedure. The patient was then brought to the angiographic suite for the procedure. A time-out was performed prior to the procedure. The right arm was prepped with chlorhexidine, draped in the usual sterile fashion using maximum barrier technique (cap and mask, sterile gown, sterile gloves, large sterile sheet, hand hygiene and cutaneous antisepsis) and infiltrated locally with 1% Lidocaine. Ultrasound demonstrated patency of the right brachial vein, and this was documented with an image. Under real-time ultrasound guidance, this vein was accessed with a 21 gauge micropuncture needle and  image documentation was performed. A 0.018 wire was introduced in to the vein. Over this, a 5.0 [place] dual lumen power injectable PICC was advanced to the lower SVC/right atrial junction. Fluoroscopy during the procedure and fluoro spot radiograph confirms appropriate catheter position. The catheter was flushed and covered with a sterile dressing. Catheter length: 36 cm COMPLICATIONS: None  IMPRESSION: Successful right arm power injectable PICC line placement with ultrasound and fluoroscopic guidance. The catheter is ready for use. Electronically Signed   By: Aletta Edouard M.D.   On: 10/28/2015 11:42   Ir Fluoro Guide Cv Line Right  10/26/2015  CLINICAL DATA:  82 year old female with small bowel obstruction in need for durable central venous access for hydration and nutrition. EXAM: IR RIGHT FLOURO GUIDE CV LINE; IR ULTRASOUND GUIDANCE VASC ACCESS RIGHT FLUOROSCOPY TIME:  2 minutes 16 seconds for a total of 8.4 mGy TECHNIQUE: The right arm was prepped with chlorhexidine, draped in the usual sterile fashion using maximum barrier technique (cap and mask, sterile gown, sterile gloves, large sterile sheet, hand hygiene and cutaneous antiseptic). Local anesthesia was attained by infiltration with 1% lidocaine. Ultrasound demonstrated patency of the right brachial vein, and this was documented with an image. Under real-time ultrasound guidance, this vein was accessed with a 21 gauge micropuncture needle and image documentation was performed. The needle was exchanged over a guidewire for a peel-away sheath through which a 6 [place] dual lumen power injectable PICC was advanced, and positioned with its tip at the lower SVC/right atrial junction. Fluoroscopy during the procedure and fluoro spot radiograph confirms appropriate catheter position. The catheter was flushed, secured to the skin with Prolene sutures, and covered with a sterile dressing. COMPLICATIONS: None.  The patient tolerated the procedure well. IMPRESSION: Successful placement of a right arm dual-lumen power injectable PICC with sonographic and fluoroscopic guidance. The catheter is ready for use. Signed, Criselda Peaches, MD [facility] Electronically Signed   By: Jacqulynn Cadet M.D.   On: 10/26/2015 17:40   Ir US Guide Vasc Access Right  10/28/2015  CLINICAL DATA:   Obstructing colonic mass and need for parental nutrition. A right upper extremity PICC line placed 2 days ago has been inadvertently pulled out entirely by accident. EXAM: POWER PICC LINE PLACEMENT WITH ULTRASOUND AND FLUOROSCOPIC GUIDANCE FLUOROSCOPY TIME:  1 minutes and 42 seconds. PROCEDURE: The patient's daughter was advised of the possible risks and complications and agreed to undergo the procedure. The patient was then brought to the angiographic suite for the procedure. A time-out was performed prior to the procedure. The right arm was prepped with chlorhexidine, draped in the usual sterile fashion using maximum barrier technique (cap and mask, sterile gown, sterile gloves, large sterile sheet, hand hygiene and cutaneous antisepsis) and infiltrated locally with 1% Lidocaine. Ultrasound demonstrated patency of the right brachial vein, and this was documented with an image. Under real-time ultrasound guidance, this vein was accessed with a 21 gauge micropuncture needle and image documentation was performed. A 0.018 wire was introduced in to the vein. Over this, a 5.0 [place] dual lumen power injectable PICC was advanced to the lower SVC/right atrial junction. Fluoroscopy during the procedure and fluoro spot radiograph confirms appropriate catheter position. The catheter was flushed and covered with a sterile dressing. Catheter length: 36 cm COMPLICATIONS: None IMPRESSION: Successful right arm power injectable PICC line placement with ultrasound and fluoroscopic guidance. The catheter is ready for use. Electronically Signed   By: Jenness Corner.D.  On: 10/28/2015 11:42   Ir US Guide Vasc Access Right  10/26/2015  CLINICAL DATA:  82 year old female with small bowel obstruction in need for durable central venous access for hydration and nutrition. EXAM: IR RIGHT FLOURO GUIDE CV LINE; IR ULTRASOUND GUIDANCE VASC ACCESS RIGHT FLUOROSCOPY TIME:  2 minutes 16 seconds for a total of 8.4 mGy TECHNIQUE: The right  arm was prepped with chlorhexidine, draped in the usual sterile fashion using maximum barrier technique (cap and mask, sterile gown, sterile gloves, large sterile sheet, hand hygiene and cutaneous antiseptic). Local anesthesia was attained by infiltration with 1% lidocaine. Ultrasound demonstrated patency of the right brachial vein, and this was documented with an image. Under real-time ultrasound guidance, this vein was accessed with a 21 gauge micropuncture needle and image documentation was performed. The needle was exchanged over a guidewire for a peel-away sheath through which a 6 [place] dual lumen power injectable PICC was advanced, and positioned with its tip at the lower SVC/right atrial junction. Fluoroscopy during the procedure and fluoro spot radiograph confirms appropriate catheter position. The catheter was flushed, secured to the skin with Prolene sutures, and covered with a sterile dressing. COMPLICATIONS: None.  The patient tolerated the procedure well. IMPRESSION: Successful placement of a right arm dual-lumen power injectable PICC with sonographic and fluoroscopic guidance. The catheter is ready for use. Signed, Criselda Peaches, MD [facility] Electronically Signed   By: Jacqulynn Cadet M.D.   On: 10/26/2015 17:40   Dg Abd 2 Views  10/27/2015  CLINICAL DATA:  Large bowel obstruction EXAM: ABDOMEN - 2 VIEW COMPARISON:  CT 10/24/2015 FINDINGS: NG tube in the stomach with the tip near the pylorus. Stomach decompressed. Improvement in dilated small bowel. Air-fluid levels are noted on the decubitus view. Much of the small bowel is fluid-filled on previous CT. No free air on the decubitus view. Contrast in the urinary bladder from recent CT. IMPRESSION: NG tube in the stomach. Improvement in bowel obstruction pattern. No free air. Electronically Signed   By: Franchot Gallo M.D.   On: 10/27/2015 08:43   Dg Abd Portable 1v  10/29/2015   CLINICAL DATA:  Small-bowel obstruction secondary to colonic mass. History of diverticulitis of ascending colon. EXAM: PORTABLE ABDOMEN - 1 VIEW COMPARISON:  Abdominal plain films dated 10/27/2015 and 10/26/2015. Comparison also made to CT dated 10/24/2015. FINDINGS: NG tube remains in place with tip well-positioned near the expected region of the pylorus. There are no dilated bowel loops identified on today's exam. No evidence of free intraperitoneal air. Degenerative changes again noted throughout the scoliotic thoracolumbar spine. No acute -appearing osseous abnormality. IMPRESSION: 1. Nasogastric tube well-positioned within the stomach with tip near the expected region of the pylorus. 2. No dilated bowel loops appreciated on today's exam. Electronically Signed   By: Franki Cabot M.D.   On: 10/29/2015 08:51   Dg Addison Bailey G Tube Plc W/fl-no Rad  10/26/2015  CLINICAL DATA:  NASO G TUBE PLACEMENT WITH FLUORO Fluoroscopy was utilized by the requesting physician.  No radiographic interpretation.       Nicolette Bang, DO 10/26/2015, 2:04 PM PGY-1, [place]

## 2015-11-26 NOTE — Progress Notes (Signed)
Called to room by family for pt change in status; RN assessed pt to be without respiration or heartbeat; confirmed with 2nd RN Precious Bard; time of death 3; support provided to family at bedside; charge nurse notified; Family Med Resident Juleen China and [place] NP Davis Gourd notified via telephone Blair Hailey, RN

## 2015-11-26 NOTE — Progress Notes (Signed)
Family Medicine Teaching Service Daily Progress Note Intern Pager: [phone]  Patient name: Caroline Lewis Medical record number: [id number] Date of birth: Jul 14, 1924 Age: 82 y.o. Gender: female  Primary Care Provider: Zigmund Gottron, MD Consultants: Gen Surg, GI  Code Status: DNR  Pt Overview and Major Events to Date:  11/30: Admitted for SBO   Assessment and Plan: Caroline Lewis is a 82 y.o. female presenting with nausea, vomiting and intermittent sharp abdominal pain. PMH is significant for IBS, RA, constipation, GERD, PVD, postmenopausal bleeding, hiatal hernia, and recent DVT started on coumadin.   Small bowel obstruction: Seen on CT abdomen/pelvis. Also with colonic mass concerning for neoplasm as possible source of inflammation that could have caused obstruction. Colonoscopy performed on 12/6. Patient has since declined and respiratory status has worsened. Goals have shifted to comfort care.  -morphine, tylenol, and robinul for pain control and comfort care  -palliative care consulted, greatly appreciate their assistance with care   SIRS: Patient continues to have a  leukocytosis. Isolated fever to 100.7 on AM of 12/5. Normal lactic acid at admission. No source of infection noted, possibly GI related? CXR showed possible LLL infiltrate. Vancomycin and Zosyn were started but patient continued to have respiratory decline despite supplemental O2. Antibiotics were discontinued on 12/6 in order to minimize medications for comfort care.   Recent Left DVT: INR 1.59 (12/2)  -discontinued lovenox   Anemia of chronic disease: HgB 8.7.  During last admission, anemia panel significant for borderline low folate at 5.9, iron, ferretin and TIBC. - Holding home ferrous sulfate and folic acid   RA: Patient has decreased mobility 2/2 RA and gets around at home with an electric scooter.  -will not order weekly methotrexate   LE Edema: Improved with treatment of DVT. HFpEF, with normal  EF and grade 2 diastolic dysfunction on ECHO 11/28.   Protein calorie malnutrition: With excessive muscular atrophy and ongoing weight loss possibly related to neoplasm and/or inanition. Chronic constipation now with SBO. - Patient has lost nearly 7 lbs since last office visit 09/28/15.  -TPN started on 12/1, discontinued on 12/6   FEN/GI: NS @ KVO Prophylaxis: Discontinued for comfort care   Disposition: Anticipated Hospital Death   Subjective:  Patient non-responsive to voice. Some movement with stimuli. Family at bedside without questions. Discussed continuing to talk to [name] and that we will keep her as comfortable as possible.   Objective: Pulse Rate:  [115-122] 121 (12/06 1141) Resp:  [18-30] 18 (12/07 0627) BP: (127-142)/(78-84) 137/79 mmHg (12/06 1141) SpO2:  [88 %-92 %] 92 % (12/06 1141) Physical Exam: General: cachetic elderly female lying in bed  Cardiovascular: Tachycardic, regular rhythm  Lungs: Diffuse rhonchi and crackles, poor air movement at the bases, agonal breath sounds  Abdomen: Hypoactive bowel sounds, distended, soft MSK: 2-3+ pitting pedal edema of feet bilaterally, unable to palpate pedal pulses   Laboratory:  Recent Labs Lab 10/27/15 0500 10/29/15 1656 10/30/15 0540  WBC 19.1* 19.1* 20.3*  HGB 9.7* 9.1* 8.7*  HCT 31.6* 27.9* 27.5*  PLT 244 260 246    Recent Labs Lab 10/27/15 0500 10/29/15 1656 10/30/15 0540 11/22/2015 0545  NA 133* 126* 126* 127*  K 3.6 3.9 4.1 4.0  CL 100* 98* 97* 98*  CO2 26 24 23 25   BUN 10 13 15 16   CREATININE 0.53 0.46 0.50 0.40*  CALCIUM 7.5* 7.3* 7.1* 7.3*  PROT 4.6* 4.7* 4.8*  --   BILITOT 0.4 0.5 0.2*  --   ALKPHOS  67 67 61  --   ALT 10* 9* 10*  --   AST 20 16 19   --   GLUCOSE 177* 165* 197* 146*     Imaging/Diagnostic Tests: Dg Chest 2 View  10/30/2015  CLINICAL DATA:  Persistent cough, congestion EXAM: CHEST  2 VIEW COMPARISON:  10/28/2015 FINDINGS: Cardiomediastinal silhouette is stable. There  is small left pleural effusion with left basilar atelectasis or infiltrate. Central mild vascular congestion and mild perihilar interstitial prominence suspicious for mild pulmonary edema. NG tube in place. Right arm PICC line with tip in distal SVC. No pneumothorax. IMPRESSION: There is small left pleural effusion with left basilar atelectasis or infiltrate. Central mild vascular congestion and mild perihilar interstitial prominence suspicious for mild pulmonary edema. NG tube in place. Right arm PICC line with tip in distal SVC. No pneumothorax. Electronically Signed   By: Lahoma Crocker M.D.   On: 10/30/2015 13:48   Dg Chest 2 View  10/08/2015  CLINICAL DATA:  Epigastric pain.  nausea and vomiting. EXAM: CHEST  2 VIEW COMPARISON:  09/20/2015 FINDINGS: Chronic interstitial lung disease again seen with bibasilar predominance. No evidence of acute infiltrate or pleural effusion. Heart size remains at the upper limits of normal and stable. IMPRESSION: Chronic basilar predominant interstitial lung disease. No acute findings. Electronically Signed   By: Earle Gell M.D.   On: 10/21/2015 20:30   Dg Abd 1 View  10/26/2015  CLINICAL DATA:  Nasogastric tube placed under fluoroscopy. Fluoro time 3 minutes and 42 seconds. EXAM: ABDOMEN - 1 VIEW COMPARISON:  CT, 10/19/2015 FINDINGS: Nasogastric tube passes below the diaphragm to curl within the mid to distal stomach, well positioned. IMPRESSION: Well-positioned nasogastric tube. Electronically Signed   By: Lajean Manes M.D.   On: 10/26/2015 14:54   Ct Abdomen Pelvis W Contrast  10/01/2015  CLINICAL DATA:  Generalized sharp abdominal pain. Nausea and vomiting. EXAM: CT ABDOMEN AND PELVIS WITH CONTRAST TECHNIQUE: Multidetector CT imaging of the abdomen and pelvis was performed using the standard protocol following bolus administration of intravenous contrast. CONTRAST:  49mL OMNIPAQUE IOHEXOL 300 MG/ML  SOLN COMPARISON:  07/22/2015 FINDINGS: Lower chest:  Bibasilar  fibrosis.  Normal heart size. Hepatobiliary: Normal liver. Mild intrahepatic biliary ductal dilatation. Normal gallbladder. Pancreas: Normal. Spleen: Normal. Adrenals/Urinary Tract: Normal adrenal glands. No solid renal mass. No obstructive uropathy. No urolithiasis. Decompressed bladder. Stomach/Bowel: Moderate-sized hiatal hernia. Severe small bowel dilatation measuring up to 3.7 cm. The small bowel is diffusely dilated. There is dilatation of the ascending colon with an air-fluid level with a relative pointer transition in the mid ascending colon where there is a mass slight soft tissue prominence. No pneumatosis, pneumoperitoneum or portal venous gas. Vascular/Lymphatic: Normal caliber abdominal aorta with atherosclerosis. Other: No fluid collection or hematoma. Musculoskeletal: Right total hip arthroplasty with resulting beam hardening artifact obscuring portions of the pelvis. Three cannulated screws transfixing the femoral neck. No acute osseous abnormality. No lytic or sclerotic osseous lesion. S-shaped scoliosis of the thoracolumbar spine. Severe degenerative disc disease throughout the thoracolumbar spine. Chronic T8, T9, T11, T12 and L1 vertebral body compression fractures. IMPRESSION: 1. High-grade small bowel obstruction with a transition point in the mid ascending colon. Masslike prominence in the mid ascending colon concerning for neoplasm. Recommend further evaluation with colonoscopy. Electronically Signed   By: Kathreen Devoid   On: 10/06/2015 23:55   Ir Fluoro Guide Cv Line Right  10/28/2015  CLINICAL DATA:  Obstructing colonic mass and need for parental nutrition. A right upper  extremity PICC line placed 2 days ago has been inadvertently pulled out entirely by accident. EXAM: POWER PICC LINE PLACEMENT WITH ULTRASOUND AND FLUOROSCOPIC GUIDANCE FLUOROSCOPY TIME:  1 minutes and 42 seconds. PROCEDURE: The patient's daughter was advised of the possible risks and complications and agreed to undergo the  procedure. The patient was then brought to the angiographic suite for the procedure. A time-out was performed prior to the procedure. The right arm was prepped with chlorhexidine, draped in the usual sterile fashion using maximum barrier technique (cap and mask, sterile gown, sterile gloves, large sterile sheet, hand hygiene and cutaneous antisepsis) and infiltrated locally with 1% Lidocaine. Ultrasound demonstrated patency of the right brachial vein, and this was documented with an image. Under real-time ultrasound guidance, this vein was accessed with a 21 gauge micropuncture needle and image documentation was performed. A 0.018 wire was introduced in to the vein. Over this, a 5.0 [place] dual lumen power injectable PICC was advanced to the lower SVC/right atrial junction. Fluoroscopy during the procedure and fluoro spot radiograph confirms appropriate catheter position. The catheter was flushed and covered with a sterile dressing. Catheter length: 36 cm COMPLICATIONS: None IMPRESSION: Successful right arm power injectable PICC line placement with ultrasound and fluoroscopic guidance. The catheter is ready for use. Electronically Signed   By: Aletta Edouard M.D.   On: 10/28/2015 11:42   Ir Fluoro Guide Cv Line Right  10/26/2015  CLINICAL DATA:  82 year old female with small bowel obstruction in need for durable central venous access for hydration and nutrition. EXAM: IR RIGHT FLOURO GUIDE CV LINE; IR ULTRASOUND GUIDANCE VASC ACCESS RIGHT FLUOROSCOPY TIME:  2 minutes 16 seconds for a total of 8.4 mGy TECHNIQUE: The right arm was prepped with chlorhexidine, draped in the usual sterile fashion using maximum barrier technique (cap and mask, sterile gown, sterile gloves, large sterile sheet, hand hygiene and cutaneous antiseptic). Local anesthesia was attained by infiltration with 1% lidocaine. Ultrasound demonstrated patency of the right brachial vein, and this was documented with an image. Under real-time  ultrasound guidance, this vein was accessed with a 21 gauge micropuncture needle and image documentation was performed. The needle was exchanged over a guidewire for a peel-away sheath through which a 6 [place] dual lumen power injectable PICC was advanced, and positioned with its tip at the lower SVC/right atrial junction. Fluoroscopy during the procedure and fluoro spot radiograph confirms appropriate catheter position. The catheter was flushed, secured to the skin with Prolene sutures, and covered with a sterile dressing. COMPLICATIONS: None.  The patient tolerated the procedure well. IMPRESSION: Successful placement of a right arm dual-lumen power injectable PICC with sonographic and fluoroscopic guidance. The catheter is ready for use. Signed, Criselda Peaches, MD [facility] Electronically Signed   By: Jacqulynn Cadet M.D.   On: 10/26/2015 17:40   Ir US Guide Vasc Access Right  10/28/2015  CLINICAL DATA:  Obstructing colonic mass and need for parental nutrition. A right upper extremity PICC line placed 2 days ago has been inadvertently pulled out entirely by accident. EXAM: POWER PICC LINE PLACEMENT WITH ULTRASOUND AND FLUOROSCOPIC GUIDANCE FLUOROSCOPY TIME:  1 minutes and 42 seconds. PROCEDURE: The patient's daughter was advised of the possible risks and complications and agreed to undergo the procedure. The patient was then brought to the angiographic suite for the procedure. A time-out was performed prior to the procedure. The right arm was prepped with chlorhexidine, draped in the usual sterile fashion using maximum barrier technique (  cap and mask, sterile gown, sterile gloves, large sterile sheet, hand hygiene and cutaneous antisepsis) and infiltrated locally with 1% Lidocaine. Ultrasound demonstrated patency of the right brachial vein, and this was documented with an image. Under real-time ultrasound guidance, this vein was accessed with a  21 gauge micropuncture needle and image documentation was performed. A 0.018 wire was introduced in to the vein. Over this, a 5.0 [place] dual lumen power injectable PICC was advanced to the lower SVC/right atrial junction. Fluoroscopy during the procedure and fluoro spot radiograph confirms appropriate catheter position. The catheter was flushed and covered with a sterile dressing. Catheter length: 36 cm COMPLICATIONS: None IMPRESSION: Successful right arm power injectable PICC line placement with ultrasound and fluoroscopic guidance. The catheter is ready for use. Electronically Signed   By: Aletta Edouard M.D.   On: 10/28/2015 11:42   Ir US Guide Vasc Access Right  10/26/2015  CLINICAL DATA:  82 year old female with small bowel obstruction in need for durable central venous access for hydration and nutrition. EXAM: IR RIGHT FLOURO GUIDE CV LINE; IR ULTRASOUND GUIDANCE VASC ACCESS RIGHT FLUOROSCOPY TIME:  2 minutes 16 seconds for a total of 8.4 mGy TECHNIQUE: The right arm was prepped with chlorhexidine, draped in the usual sterile fashion using maximum barrier technique (cap and mask, sterile gown, sterile gloves, large sterile sheet, hand hygiene and cutaneous antiseptic). Local anesthesia was attained by infiltration with 1% lidocaine. Ultrasound demonstrated patency of the right brachial vein, and this was documented with an image. Under real-time ultrasound guidance, this vein was accessed with a 21 gauge micropuncture needle and image documentation was performed. The needle was exchanged over a guidewire for a peel-away sheath through which a 6 [place] dual lumen power injectable PICC was advanced, and positioned with its tip at the lower SVC/right atrial junction. Fluoroscopy during the procedure and fluoro spot radiograph confirms appropriate catheter position. The catheter was flushed, secured to the skin with Prolene sutures, and covered with a sterile dressing. COMPLICATIONS: None.  The patient  tolerated the procedure well. IMPRESSION: Successful placement of a right arm dual-lumen power injectable PICC with sonographic and fluoroscopic guidance. The catheter is ready for use. Signed, Criselda Peaches, MD [facility] Electronically Signed   By: Jacqulynn Cadet M.D.   On: 10/26/2015 17:40   Dg Abd 2 Views  10/27/2015  CLINICAL DATA:  Large bowel obstruction EXAM: ABDOMEN - 2 VIEW COMPARISON:  CT 10/20/2015 FINDINGS: NG tube in the stomach with the tip near the pylorus. Stomach decompressed. Improvement in dilated small bowel. Air-fluid levels are noted on the decubitus view. Much of the small bowel is fluid-filled on previous CT. No free air on the decubitus view. Contrast in the urinary bladder from recent CT. IMPRESSION: NG tube in the stomach. Improvement in bowel obstruction pattern. No free air. Electronically Signed   By: Franchot Gallo M.D.   On: 10/27/2015 08:43   Dg Abd Portable 1v  10/29/2015  CLINICAL DATA:  Small-bowel obstruction secondary to colonic mass. History of diverticulitis of ascending colon. EXAM: PORTABLE ABDOMEN - 1 VIEW COMPARISON:  Abdominal plain films dated 10/27/2015 and 10/26/2015. Comparison also made to CT dated 10/06/2015. FINDINGS: NG tube remains in place with tip well-positioned near the expected region of the pylorus. There are no dilated bowel loops identified on today's exam. No evidence of free intraperitoneal air. Degenerative changes again noted throughout the scoliotic thoracolumbar spine. No acute -appearing osseous abnormality. IMPRESSION: 1. Nasogastric tube well-positioned  within the stomach with tip near the expected region of the pylorus. 2. No dilated bowel loops appreciated on today's exam. Electronically Signed   By: Franki Cabot M.D.   On: 10/29/2015 08:51   Dg Addison Bailey G Tube Plc W/fl-no Rad  10/26/2015  CLINICAL DATA:  NASO G TUBE PLACEMENT WITH FLUORO Fluoroscopy was utilized by the  requesting physician.  No radiographic interpretation.    Nicolette Bang, DO 11-10-2015, 9:39 AM PGY-1, [place] Intern pager: [phone], text pages welcome

## 2015-11-26 DEATH — deceased
